# Patient Record
Sex: Male | Born: 1935 | Race: White | Hispanic: No | Marital: Married | Smoking: Current every day smoker
Health system: Southern US, Community
[De-identification: ages and names within clinical notes are randomized; demographics above are authoritative.]

## PROBLEM LIST (undated history)

## (undated) DIAGNOSIS — I10 Essential (primary) hypertension: Secondary | ICD-10-CM

## (undated) DIAGNOSIS — J939 Pneumothorax, unspecified: Secondary | ICD-10-CM

## (undated) DIAGNOSIS — C61 Malignant neoplasm of prostate: Secondary | ICD-10-CM

## (undated) DIAGNOSIS — I4891 Unspecified atrial fibrillation: Secondary | ICD-10-CM

## (undated) DIAGNOSIS — J449 Chronic obstructive pulmonary disease, unspecified: Secondary | ICD-10-CM

## (undated) DIAGNOSIS — J189 Pneumonia, unspecified organism: Secondary | ICD-10-CM

## (undated) DIAGNOSIS — Z720 Tobacco use: Secondary | ICD-10-CM

## (undated) DIAGNOSIS — I639 Cerebral infarction, unspecified: Secondary | ICD-10-CM

## (undated) DIAGNOSIS — Z950 Presence of cardiac pacemaker: Secondary | ICD-10-CM

## (undated) DIAGNOSIS — I2510 Atherosclerotic heart disease of native coronary artery without angina pectoris: Secondary | ICD-10-CM

## (undated) DIAGNOSIS — I509 Heart failure, unspecified: Secondary | ICD-10-CM

## (undated) DIAGNOSIS — I442 Atrioventricular block, complete: Secondary | ICD-10-CM

## (undated) HISTORY — DX: Tobacco use: Z72.0

## (undated) HISTORY — DX: Cerebral infarction, unspecified: I63.9

## (undated) HISTORY — DX: Malignant neoplasm of prostate: C61

## (undated) HISTORY — PX: EYE SURGERY: SHX253

## (undated) HISTORY — DX: Pneumothorax, unspecified: J93.9

## (undated) HISTORY — PX: APPENDECTOMY: SHX54

## (undated) HISTORY — DX: Essential (primary) hypertension: I10

## (undated) HISTORY — DX: Chronic obstructive pulmonary disease, unspecified: J44.9

## (undated) HISTORY — DX: Unspecified atrial fibrillation: I48.91

## (undated) HISTORY — PX: CHOLECYSTECTOMY: SHX55

## (undated) HISTORY — DX: Atrioventricular block, complete: I44.2

## (undated) HISTORY — DX: Atherosclerotic heart disease of native coronary artery without angina pectoris: I25.10

---

## 1998-05-19 ENCOUNTER — Ambulatory Visit (HOSPITAL_COMMUNITY): Admission: RE | Admit: 1998-05-19 | Discharge: 1998-05-19 | Payer: Self-pay | Admitting: Unknown Physician Specialty

## 1998-06-30 ENCOUNTER — Other Ambulatory Visit: Admission: RE | Admit: 1998-06-30 | Discharge: 1998-06-30 | Payer: Self-pay | Admitting: Urology

## 1998-08-26 DIAGNOSIS — I639 Cerebral infarction, unspecified: Secondary | ICD-10-CM

## 1998-08-26 HISTORY — DX: Cerebral infarction, unspecified: I63.9

## 1999-01-01 ENCOUNTER — Encounter: Payer: Self-pay | Admitting: Emergency Medicine

## 1999-01-01 ENCOUNTER — Emergency Department (HOSPITAL_COMMUNITY): Admission: EM | Admit: 1999-01-01 | Discharge: 1999-01-01 | Payer: Self-pay | Admitting: Emergency Medicine

## 2001-01-30 ENCOUNTER — Inpatient Hospital Stay (HOSPITAL_COMMUNITY): Admission: EM | Admit: 2001-01-30 | Discharge: 2001-02-03 | Payer: Self-pay | Admitting: Emergency Medicine

## 2001-01-30 ENCOUNTER — Encounter: Payer: Self-pay | Admitting: Neurology

## 2001-08-26 DIAGNOSIS — C61 Malignant neoplasm of prostate: Secondary | ICD-10-CM

## 2001-08-26 HISTORY — PX: PROSTATECTOMY: SHX69

## 2001-08-26 HISTORY — PX: CORONARY ARTERY BYPASS GRAFT: SHX141

## 2001-08-26 HISTORY — DX: Malignant neoplasm of prostate: C61

## 2002-01-11 ENCOUNTER — Encounter: Admission: RE | Admit: 2002-01-11 | Discharge: 2002-01-11 | Payer: Self-pay | Admitting: Urology

## 2002-01-11 ENCOUNTER — Encounter: Payer: Self-pay | Admitting: Urology

## 2002-02-08 ENCOUNTER — Encounter: Payer: Self-pay | Admitting: Urology

## 2002-02-08 ENCOUNTER — Ambulatory Visit (HOSPITAL_COMMUNITY): Admission: RE | Admit: 2002-02-08 | Discharge: 2002-02-08 | Payer: Self-pay | Admitting: Urology

## 2002-02-10 ENCOUNTER — Inpatient Hospital Stay (HOSPITAL_COMMUNITY): Admission: AD | Admit: 2002-02-10 | Discharge: 2002-02-25 | Payer: Self-pay | Admitting: Cardiovascular Disease

## 2002-02-15 ENCOUNTER — Encounter: Payer: Self-pay | Admitting: Thoracic Surgery (Cardiothoracic Vascular Surgery)

## 2002-02-16 ENCOUNTER — Encounter: Payer: Self-pay | Admitting: Thoracic Surgery (Cardiothoracic Vascular Surgery)

## 2002-02-17 ENCOUNTER — Encounter: Payer: Self-pay | Admitting: Thoracic Surgery (Cardiothoracic Vascular Surgery)

## 2002-02-18 ENCOUNTER — Encounter: Payer: Self-pay | Admitting: Thoracic Surgery (Cardiothoracic Vascular Surgery)

## 2002-02-20 ENCOUNTER — Encounter: Payer: Self-pay | Admitting: Cardiothoracic Surgery

## 2002-02-23 ENCOUNTER — Encounter: Payer: Self-pay | Admitting: Thoracic Surgery (Cardiothoracic Vascular Surgery)

## 2002-03-05 ENCOUNTER — Encounter: Payer: Self-pay | Admitting: Emergency Medicine

## 2002-03-05 ENCOUNTER — Emergency Department (HOSPITAL_COMMUNITY): Admission: EM | Admit: 2002-03-05 | Discharge: 2002-03-05 | Payer: Self-pay | Admitting: Emergency Medicine

## 2002-03-17 ENCOUNTER — Encounter
Admission: RE | Admit: 2002-03-17 | Discharge: 2002-03-17 | Payer: Self-pay | Admitting: Thoracic Surgery (Cardiothoracic Vascular Surgery)

## 2002-03-17 ENCOUNTER — Encounter: Payer: Self-pay | Admitting: Thoracic Surgery (Cardiothoracic Vascular Surgery)

## 2002-06-01 ENCOUNTER — Encounter: Payer: Self-pay | Admitting: Urology

## 2002-06-07 ENCOUNTER — Inpatient Hospital Stay (HOSPITAL_COMMUNITY): Admission: RE | Admit: 2002-06-07 | Discharge: 2002-06-09 | Payer: Self-pay | Admitting: Urology

## 2002-06-07 ENCOUNTER — Encounter (INDEPENDENT_AMBULATORY_CARE_PROVIDER_SITE_OTHER): Payer: Self-pay | Admitting: Specialist

## 2002-09-10 ENCOUNTER — Encounter (INDEPENDENT_AMBULATORY_CARE_PROVIDER_SITE_OTHER): Payer: Self-pay | Admitting: *Deleted

## 2002-09-10 ENCOUNTER — Observation Stay (HOSPITAL_COMMUNITY): Admission: RE | Admit: 2002-09-10 | Discharge: 2002-09-11 | Payer: Self-pay | Admitting: General Surgery

## 2002-11-25 ENCOUNTER — Emergency Department (HOSPITAL_COMMUNITY): Admission: EM | Admit: 2002-11-25 | Discharge: 2002-11-25 | Payer: Self-pay

## 2005-06-26 ENCOUNTER — Inpatient Hospital Stay (HOSPITAL_COMMUNITY): Admission: EM | Admit: 2005-06-26 | Discharge: 2005-07-01 | Payer: Self-pay | Admitting: Emergency Medicine

## 2005-07-16 ENCOUNTER — Encounter: Admission: RE | Admit: 2005-07-16 | Discharge: 2005-07-16 | Payer: Self-pay | Admitting: Surgery

## 2005-09-28 ENCOUNTER — Inpatient Hospital Stay (HOSPITAL_COMMUNITY): Admission: EM | Admit: 2005-09-28 | Discharge: 2005-09-30 | Payer: Self-pay | Admitting: Emergency Medicine

## 2007-05-02 ENCOUNTER — Emergency Department (HOSPITAL_COMMUNITY): Admission: EM | Admit: 2007-05-02 | Discharge: 2007-05-02 | Payer: Self-pay | Admitting: Emergency Medicine

## 2007-05-21 ENCOUNTER — Encounter (INDEPENDENT_AMBULATORY_CARE_PROVIDER_SITE_OTHER): Payer: Self-pay | Admitting: General Surgery

## 2007-05-21 ENCOUNTER — Ambulatory Visit (HOSPITAL_COMMUNITY): Admission: RE | Admit: 2007-05-21 | Discharge: 2007-05-21 | Payer: Self-pay | Admitting: Internal Medicine

## 2009-12-25 ENCOUNTER — Inpatient Hospital Stay (HOSPITAL_COMMUNITY): Admission: AD | Admit: 2009-12-25 | Discharge: 2009-12-29 | Payer: Self-pay | Admitting: Cardiovascular Disease

## 2009-12-26 ENCOUNTER — Encounter (INDEPENDENT_AMBULATORY_CARE_PROVIDER_SITE_OTHER): Payer: Self-pay | Admitting: Cardiovascular Disease

## 2010-04-19 ENCOUNTER — Encounter: Admission: RE | Admit: 2010-04-19 | Discharge: 2010-04-19 | Payer: Self-pay | Admitting: Cardiovascular Disease

## 2010-07-10 ENCOUNTER — Ambulatory Visit (HOSPITAL_COMMUNITY): Admission: RE | Admit: 2010-07-10 | Discharge: 2010-07-11 | Payer: Self-pay | Admitting: Obstetrics and Gynecology

## 2010-10-22 ENCOUNTER — Other Ambulatory Visit: Payer: Self-pay | Admitting: Cardiovascular Disease

## 2010-10-22 ENCOUNTER — Ambulatory Visit
Admission: RE | Admit: 2010-10-22 | Discharge: 2010-10-22 | Disposition: A | Discharge status: Home / Self Care | Payer: Medicare Other | Source: Ambulatory Visit | Attending: Cardiovascular Disease | Admitting: Cardiovascular Disease

## 2010-10-22 ENCOUNTER — Ambulatory Visit (HOSPITAL_COMMUNITY): Payer: Self-pay

## 2010-10-22 DIAGNOSIS — R6883 Chills (without fever): Secondary | ICD-10-CM

## 2010-10-22 DIAGNOSIS — J449 Chronic obstructive pulmonary disease, unspecified: Secondary | ICD-10-CM

## 2010-10-29 ENCOUNTER — Emergency Department (HOSPITAL_COMMUNITY): Payer: Medicare Other

## 2010-10-29 ENCOUNTER — Inpatient Hospital Stay (HOSPITAL_COMMUNITY)
Admission: EM | Admit: 2010-10-29 | Discharge: 2010-11-07 | DRG: 242 | Disposition: A | Discharge status: Home / Self Care | Payer: Medicare Other | Attending: Cardiovascular Disease | Admitting: Cardiovascular Disease

## 2010-10-29 DIAGNOSIS — I5023 Acute on chronic systolic (congestive) heart failure: Secondary | ICD-10-CM | POA: Diagnosis present

## 2010-10-29 DIAGNOSIS — I4891 Unspecified atrial fibrillation: Secondary | ICD-10-CM | POA: Diagnosis present

## 2010-10-29 DIAGNOSIS — J4489 Other specified chronic obstructive pulmonary disease: Secondary | ICD-10-CM | POA: Diagnosis present

## 2010-10-29 DIAGNOSIS — I442 Atrioventricular block, complete: Principal | ICD-10-CM | POA: Diagnosis present

## 2010-10-29 DIAGNOSIS — Z7901 Long term (current) use of anticoagulants: Secondary | ICD-10-CM

## 2010-10-29 DIAGNOSIS — I509 Heart failure, unspecified: Secondary | ICD-10-CM | POA: Diagnosis present

## 2010-10-29 DIAGNOSIS — I498 Other specified cardiac arrhythmias: Secondary | ICD-10-CM | POA: Diagnosis not present

## 2010-10-29 DIAGNOSIS — J449 Chronic obstructive pulmonary disease, unspecified: Secondary | ICD-10-CM | POA: Diagnosis present

## 2010-10-29 LAB — CBC
HCT: 44.1 % (ref 39.0–52.0)
Hemoglobin: 13.8 g/dL (ref 13.0–17.0)
MCH: 28.8 pg (ref 26.0–34.0)
MCHC: 31.3 g/dL (ref 30.0–36.0)
MCV: 92.1 fL (ref 78.0–100.0)
Platelets: 228 10*3/uL (ref 150–400)
RBC: 4.79 MIL/uL (ref 4.22–5.81)
RDW: 14.3 % (ref 11.5–15.5)
WBC: 8.8 10*3/uL (ref 4.0–10.5)

## 2010-10-29 LAB — DIFFERENTIAL
Basophils Absolute: 0 10*3/uL (ref 0.0–0.1)
Basophils Relative: 0 % (ref 0–1)
Eosinophils Absolute: 0.1 10*3/uL (ref 0.0–0.7)
Eosinophils Relative: 1 % (ref 0–5)
Lymphocytes Relative: 23 % (ref 12–46)
Lymphs Abs: 2 10*3/uL (ref 0.7–4.0)
Monocytes Absolute: 1.1 10*3/uL — ABNORMAL HIGH (ref 0.1–1.0)
Monocytes Relative: 13 % — ABNORMAL HIGH (ref 3–12)
Neutro Abs: 5.6 10*3/uL (ref 1.7–7.7)
Neutrophils Relative %: 64 % (ref 43–77)

## 2010-10-29 LAB — COMPREHENSIVE METABOLIC PANEL
ALT: 33 U/L (ref 0–53)
AST: 42 U/L — ABNORMAL HIGH (ref 0–37)
Albumin: 3.6 g/dL (ref 3.5–5.2)
Alkaline Phosphatase: 98 U/L (ref 39–117)
BUN: 22 mg/dL (ref 6–23)
CO2: 28 mEq/L (ref 19–32)
Calcium: 9.5 mg/dL (ref 8.4–10.5)
Chloride: 104 mEq/L (ref 96–112)
Creatinine, Ser: 0.96 mg/dL (ref 0.4–1.5)
GFR calc Af Amer: >60 mL/min (ref >60)
GFR calc non Af Amer: >60 mL/min (ref >60)
Glucose, Bld: 97 mg/dL (ref 70–99)
Potassium: 4.2 mEq/L (ref 3.5–5.1)
Sodium: 140 mEq/L (ref 135–145)
Total Bilirubin: 1.4 mg/dL — ABNORMAL HIGH (ref 0.3–1.2)
Total Protein: 6.8 g/dL (ref 6.0–8.3)

## 2010-10-29 LAB — CARDIAC PANEL(CRET KIN+CKTOT+MB+TROPI)
CK, MB: 6.9 ng/mL (ref 0.3–4.0)
CK, MB: 6.8 ng/mL (ref 0.3–4.0)
Relative Index: INVALID (ref 0.0–2.5)
Relative Index: INVALID (ref 0.0–2.5)
Total CK: 67 U/L (ref 7–232)
Total CK: 68 U/L (ref 7–232)
Troponin I: 0.1 ng/mL — ABNORMAL HIGH (ref 0.00–0.06)
Troponin I: 0.1 ng/mL — ABNORMAL HIGH (ref 0.00–0.06)

## 2010-10-29 LAB — PROTIME-INR
INR: 1.64 — ABNORMAL HIGH (ref 0.00–1.49)
Prothrombin Time: 19.6 seconds — ABNORMAL HIGH (ref 11.6–15.2)

## 2010-10-29 LAB — BRAIN NATRIURETIC PEPTIDE: Pro B Natriuretic peptide (BNP): 773 pg/mL — ABNORMAL HIGH (ref 0.0–100.0)

## 2010-10-30 LAB — HEPARIN LEVEL (UNFRACTIONATED): Heparin Unfractionated: 0.19 IU/mL — ABNORMAL LOW (ref 0.30–0.70)

## 2010-10-30 LAB — LIPID PANEL: Cholesterol: 112 mg/dL (ref 0–200)

## 2010-10-30 LAB — PROTIME-INR
INR: 1.66 — ABNORMAL HIGH (ref 0.00–1.49)
Prothrombin Time: 19.8 seconds — ABNORMAL HIGH (ref 11.6–15.2)

## 2010-10-30 LAB — CARDIAC PANEL(CRET KIN+CKTOT+MB+TROPI)
Relative Index: INVALID (ref 0.0–2.5)
Total CK: 60 U/L (ref 7–232)

## 2010-10-31 ENCOUNTER — Observation Stay (HOSPITAL_COMMUNITY): Payer: Medicare Other

## 2010-10-31 LAB — CBC
HCT: 42.2 % (ref 39.0–52.0)
Hemoglobin: 13 g/dL (ref 13.0–17.0)
MCHC: 30.8 g/dL (ref 30.0–36.0)
Platelets: 188 10*3/uL (ref 150–400)
RBC: 4.53 MIL/uL (ref 4.22–5.81)
RDW: 14.1 % (ref 11.5–15.5)
WBC: 8.1 10*3/uL (ref 4.0–10.5)
WBC: 12.1 10*3/uL — ABNORMAL HIGH (ref 4.0–10.5)

## 2010-10-31 LAB — PROTIME-INR: INR: 1.78 — ABNORMAL HIGH (ref 0.00–1.49)

## 2010-10-31 LAB — DIFFERENTIAL
Basophils Absolute: 0 10*3/uL (ref 0.0–0.1)
Lymphocytes Relative: 12 % (ref 12–46)
Monocytes Absolute: 1.5 10*3/uL — ABNORMAL HIGH (ref 0.1–1.0)
Neutro Abs: 9.1 10*3/uL — ABNORMAL HIGH (ref 1.7–7.7)
Neutrophils Relative %: 75 % (ref 43–77)

## 2010-10-31 LAB — CARDIAC PANEL(CRET KIN+CKTOT+MB+TROPI)
CK, MB: 5.5 ng/mL — ABNORMAL HIGH (ref 0.3–4.0)
Troponin I: 0.06 ng/mL (ref 0.00–0.06)

## 2010-10-31 LAB — BASIC METABOLIC PANEL
BUN: 25 mg/dL — ABNORMAL HIGH (ref 6–23)
GFR calc non Af Amer: >60 mL/min (ref >60)
Potassium: 4.5 mEq/L (ref 3.5–5.1)
Sodium: 137 mEq/L (ref 135–145)

## 2010-11-01 LAB — CBC
HCT: 39.2 % (ref 39.0–52.0)
Hemoglobin: 11.8 g/dL — ABNORMAL LOW (ref 13.0–17.0)
MCH: 27.8 pg (ref 26.0–34.0)
MCV: 92.2 fL (ref 78.0–100.0)
RBC: 4.25 MIL/uL (ref 4.22–5.81)

## 2010-11-01 LAB — BASIC METABOLIC PANEL
CO2: 32 mEq/L (ref 19–32)
Chloride: 99 mEq/L (ref 96–112)
GFR calc Af Amer: >60 mL/min (ref >60)
Potassium: 4.7 mEq/L (ref 3.5–5.1)
Sodium: 139 mEq/L (ref 135–145)

## 2010-11-01 LAB — HEPARIN LEVEL (UNFRACTIONATED): Heparin Unfractionated: 0.27 IU/mL — ABNORMAL LOW (ref 0.30–0.70)

## 2010-11-01 LAB — MAGNESIUM: Magnesium: 2 mg/dL (ref 1.5–2.5)

## 2010-11-01 LAB — PROTIME-INR: INR: 1.41 (ref 0.00–1.49)

## 2010-11-02 LAB — PROTIME-INR
INR: 1.43 (ref 0.00–1.49)
Prothrombin Time: 17.6 seconds — ABNORMAL HIGH (ref 11.6–15.2)

## 2010-11-02 LAB — HEPARIN LEVEL (UNFRACTIONATED): Heparin Unfractionated: 0.44 IU/mL (ref 0.30–0.70)

## 2010-11-02 LAB — BASIC METABOLIC PANEL
CO2: 30 mEq/L (ref 19–32)
Calcium: 8.5 mg/dL (ref 8.4–10.5)
Chloride: 100 mEq/L (ref 96–112)
Creatinine, Ser: 1.28 mg/dL (ref 0.4–1.5)
GFR calc Af Amer: >60 mL/min (ref >60)
Sodium: 135 mEq/L (ref 135–145)

## 2010-11-02 LAB — CBC
HCT: 35.7 % — ABNORMAL LOW (ref 39.0–52.0)
Hemoglobin: 10.9 g/dL — ABNORMAL LOW (ref 13.0–17.0)
WBC: 8.2 10*3/uL (ref 4.0–10.5)

## 2010-11-03 LAB — PROTIME-INR: INR: 1.8 — ABNORMAL HIGH (ref 0.00–1.49)

## 2010-11-03 LAB — HEPARIN LEVEL (UNFRACTIONATED): Heparin Unfractionated: 0.73 IU/mL — ABNORMAL HIGH (ref 0.30–0.70)

## 2010-11-04 LAB — CBC
HCT: 32.7 % — ABNORMAL LOW (ref 39.0–52.0)
Hemoglobin: 10.1 g/dL — ABNORMAL LOW (ref 13.0–17.0)
RBC: 3.51 MIL/uL — ABNORMAL LOW (ref 4.22–5.81)

## 2010-11-04 LAB — HEPARIN LEVEL (UNFRACTIONATED): Heparin Unfractionated: 0.44 IU/mL (ref 0.30–0.70)

## 2010-11-04 LAB — BASIC METABOLIC PANEL
Calcium: 8.8 mg/dL (ref 8.4–10.5)
GFR calc Af Amer: >60 mL/min (ref >60)
GFR calc non Af Amer: >60 mL/min (ref >60)
Potassium: 4.8 mEq/L (ref 3.5–5.1)
Sodium: 138 mEq/L (ref 135–145)

## 2010-11-04 LAB — PROTIME-INR
INR: 2.23 — ABNORMAL HIGH (ref 0.00–1.49)
Prothrombin Time: 24.8 seconds — ABNORMAL HIGH (ref 11.6–15.2)

## 2010-11-05 DIAGNOSIS — I498 Other specified cardiac arrhythmias: Secondary | ICD-10-CM

## 2010-11-05 LAB — PROTIME-INR
INR: 2.13 — ABNORMAL HIGH (ref 0.00–1.49)
Prothrombin Time: 24 seconds — ABNORMAL HIGH (ref 11.6–15.2)

## 2010-11-05 LAB — CBC
HCT: 32.1 % — ABNORMAL LOW (ref 39.0–52.0)
Hemoglobin: 9.8 g/dL — ABNORMAL LOW (ref 13.0–17.0)
MCHC: 30.5 g/dL (ref 30.0–36.0)
RBC: 3.45 MIL/uL — ABNORMAL LOW (ref 4.22–5.81)
WBC: 6.2 10*3/uL (ref 4.0–10.5)

## 2010-11-06 DIAGNOSIS — I442 Atrioventricular block, complete: Secondary | ICD-10-CM

## 2010-11-06 HISTORY — PX: PACEMAKER INSERTION: SHX728

## 2010-11-06 LAB — CBC
HCT: 43.5 % (ref 39.0–52.0)
Hemoglobin: 10.5 g/dL — ABNORMAL LOW (ref 13.0–17.0)
MCHC: 32 g/dL (ref 30.0–36.0)
Platelets: 226 10*3/uL (ref 150–400)
RBC: 3.57 MIL/uL — ABNORMAL LOW (ref 4.22–5.81)
RBC: 4.86 MIL/uL (ref 4.22–5.81)
RDW: 14 % (ref 11.5–15.5)
WBC: 6.6 10*3/uL (ref 4.0–10.5)
WBC: 8.6 10*3/uL (ref 4.0–10.5)

## 2010-11-06 LAB — BASIC METABOLIC PANEL
BUN: 19 mg/dL (ref 6–23)
GFR calc Af Amer: >60 mL/min (ref >60)
GFR calc non Af Amer: >60 mL/min (ref >60)
Potassium: 4.9 mEq/L (ref 3.5–5.1)

## 2010-11-06 LAB — APTT: aPTT: 31 seconds (ref 24–37)

## 2010-11-06 LAB — PROTIME-INR
INR: 1.93 — ABNORMAL HIGH (ref 0.00–1.49)
INR: 1.2 (ref 0.00–1.49)
Prothrombin Time: 22.2 seconds — ABNORMAL HIGH (ref 11.6–15.2)

## 2010-11-07 ENCOUNTER — Encounter: Payer: Self-pay | Admitting: Internal Medicine

## 2010-11-07 ENCOUNTER — Inpatient Hospital Stay (HOSPITAL_COMMUNITY): Payer: Medicare Other

## 2010-11-07 LAB — CBC
MCH: 28.8 pg (ref 26.0–34.0)
MCV: 91.2 fL (ref 78.0–100.0)
Platelets: 198 10*3/uL (ref 150–400)
RDW: 14 % (ref 11.5–15.5)

## 2010-11-07 LAB — CULTURE, BLOOD (ROUTINE X 2)
Culture  Setup Time: 201203080102
Culture  Setup Time: 201203080102
Culture: NO GROWTH

## 2010-11-09 NOTE — Op Note (Signed)
  [name], [name]                 ACCOUNT NO.:  [id number]  MEDICAL RECORD NO.:  [id number]           PATIENT TYPE:  I  LOCATION:  3714                         FACILITY:  [facility]  PHYSICIAN:  Hillis Range, MD       DATE OF BIRTH:  [birth year]  DATE OF PROCEDURE:  11/06/2010 DATE OF DISCHARGE:                              OPERATIVE REPORT   PREPROCEDURE DIAGNOSES: 1. Complete heart block. 2. Permanent atrial fibrillation.  POSTPROCEDURE DIAGNOSES: 1. Complete heart block. 2. Permanent atrial fibrillation.  PROCEDURES: 1. Left upper extremity venography. 2. Pacemaker implantation.  INTRODUCTION:  [name] is a pleasant 77 year old gentleman with a history of chronic lung disease and permanent atrial fibrillation.  He was admitted with shortness of breath and fatigue.  He reports frequent episodes of dizziness.  He was found to have atrial fibrillation with prolonged and regular RR intervals.  He, therefore, presents for pacemaker implantation.  DESCRIPTION OF PROCEDURE:  Informed written consent was obtained and the patient was brought to the electrophysiology lab in the fasting state. He received no sedation for the procedure today due to chronic lung disease.  His left chest was prepped and draped in the usual sterile fashion by the EP lab staff.  The skin overlying his left chest was infiltrated with lidocaine for local analgesia.  A 3-cm incision was made over the left deltopectoral region.  A left subcutaneous pacemaker pocket was fashioned using a combination of sharp and blunt dissection. Electrocautery was used to assure hemostasis.  A venogram of the left upper extremity was performed which revealed a very small left cephalic vein with a moderate-sized left axillary vein which both emptied into a moderate-sized left subclavian vein.  The left axillary vein was, therefore, cannulated with fluoroscopic visualization.  Through the left axillary vein, a [employer]  Isoflex model [id number] (serial number [id number]).  Right ventricular lead was advanced with fluoroscopic visualization into the right ventricular apex position.  R-waves measured 11.1 mV with impedance of 548 ohms and a threshold of 0.4 volts at 0.4 milliseconds.  Due to permanent atrial fibrillation, an atrial lead was not implanted.  The lead was, therefore, secured to the pectoralis fascia using #2 silk suture over the suture sleeves.  The lead was then connected to a Designer[employer] jewellery accent RFSR model [id number]) pacemaker.  The pocket was irrigated with copious gentamicin solution.  The pacemaker was then placed into the pocket.  The pocket was then closed in 2 layers with 2.0 Vicryl suture for the subcutaneous and subcuticular layers.  Steri-Strips and a sterile dressing were then applied.  There were no early apparent complications.  CONCLUSIONS: 1. Successful implantation of a [employer]. Jude Medical accent RFSR pacemaker     for permanent atrial fibrillation with complete heart block. 2. No early apparent complications.     Hillis Range, MD     JA/MEDQ  D:  11/06/2010  T:  11/07/2010  Job:  [id number]  cc:   Ricki Rodriguez, M.D.  Electronically Signed by Hillis Range MD on 11/09/2010 10:57:59 PM

## 2010-11-13 LAB — LIPID PANEL
LDL Cholesterol: 62 mg/dL (ref 0–99)
VLDL: 10 mg/dL (ref 0–40)

## 2010-11-13 LAB — BASIC METABOLIC PANEL
BUN: 14 mg/dL (ref 6–23)
BUN: 15 mg/dL (ref 6–23)
BUN: 17 mg/dL (ref 6–23)
CO2: 33 mEq/L — ABNORMAL HIGH (ref 19–32)
CO2: 35 mEq/L — ABNORMAL HIGH (ref 19–32)
Calcium: 8.8 mg/dL (ref 8.4–10.5)
Chloride: 101 mEq/L (ref 96–112)
Chloride: 96 mEq/L (ref 96–112)
Chloride: 98 mEq/L (ref 96–112)
Creatinine, Ser: 0.84 mg/dL (ref 0.4–1.5)
GFR calc Af Amer: >60 mL/min (ref >60)
GFR calc non Af Amer: >60 mL/min (ref >60)
Glucose, Bld: 109 mg/dL — ABNORMAL HIGH (ref 70–99)
Glucose, Bld: 120 mg/dL — ABNORMAL HIGH (ref 70–99)
Potassium: 4 mEq/L (ref 3.5–5.1)
Potassium: 4.8 mEq/L (ref 3.5–5.1)
Sodium: 135 mEq/L (ref 135–145)
Sodium: 135 mEq/L (ref 135–145)

## 2010-11-13 LAB — CARDIAC PANEL(CRET KIN+CKTOT+MB+TROPI)
CK, MB: 5.5 ng/mL — ABNORMAL HIGH (ref 0.3–4.0)
Relative Index: INVALID (ref 0.0–2.5)
Total CK: 50 U/L (ref 7–232)
Troponin I: 0.1 ng/mL — ABNORMAL HIGH (ref 0.00–0.06)

## 2010-11-13 LAB — CBC
HCT: 37.3 % — ABNORMAL LOW (ref 39.0–52.0)
HCT: 39.4 % (ref 39.0–52.0)
HCT: 41.1 % (ref 39.0–52.0)
Hemoglobin: 12.1 g/dL — ABNORMAL LOW (ref 13.0–17.0)
Hemoglobin: 13.2 g/dL (ref 13.0–17.0)
MCHC: 32.3 g/dL (ref 30.0–36.0)
MCHC: 33.4 g/dL (ref 30.0–36.0)
MCV: 87.7 fL (ref 78.0–100.0)
MCV: 87.8 fL (ref 78.0–100.0)
MCV: 88.2 fL (ref 78.0–100.0)
Platelets: 199 10*3/uL (ref 150–400)
RBC: 4.49 MIL/uL (ref 4.22–5.81)
RBC: 4.68 MIL/uL (ref 4.22–5.81)
RDW: 15 % (ref 11.5–15.5)
WBC: 8.6 10*3/uL (ref 4.0–10.5)
WBC: 8.9 10*3/uL (ref 4.0–10.5)

## 2010-11-13 LAB — PROTIME-INR
Prothrombin Time: 20.6 seconds — ABNORMAL HIGH (ref 11.6–15.2)
Prothrombin Time: 35.6 seconds — ABNORMAL HIGH (ref 11.6–15.2)

## 2010-11-13 LAB — COMPREHENSIVE METABOLIC PANEL
AST: 35 U/L (ref 0–37)
BUN: 17 mg/dL (ref 6–23)
CO2: 31 mEq/L (ref 19–32)
Chloride: 105 mEq/L (ref 96–112)
Creatinine, Ser: 0.85 mg/dL (ref 0.4–1.5)
GFR calc non Af Amer: >60 mL/min (ref >60)
Total Bilirubin: 0.7 mg/dL (ref 0.3–1.2)

## 2010-11-13 LAB — DIFFERENTIAL
Basophils Absolute: 0 10*3/uL (ref 0.0–0.1)
Eosinophils Relative: 1 % (ref 0–5)
Lymphocytes Relative: 21 % (ref 12–46)
Neutro Abs: 5.8 10*3/uL (ref 1.7–7.7)

## 2010-11-13 NOTE — Miscellaneous (Signed)
Summary: Device preload  Clinical Lists Changes  Observations: Added new observation of PPM INDICATN: CHB (11/07/2010 15:32) Added new observation of MAGNET RTE: BOL 100 ERI  85 (11/07/2010 15:32) Added new observation of PPMLEADSTAT1: active (11/07/2010 15:32) Added new observation of PPMLEADSER1: [id number] (11/07/2010 15:32) Added new observation of PPMLEADMOD1: 1948  (11/07/2010 15:32) Added new observation of PPMLEADLOC1: RV  (11/07/2010 15:32) Added new observation of PPM IMP MD: Hillis Range, MD  (11/07/2010 15:32) Added new observation of PPMLEADDOI1: 11/06/2010  (11/07/2010 15:32) Added new observation of PPM DOI: 11/06/2010  (11/07/2010 15:32) Added new observation of PPM SERL#: [id number]  (11/07/2010 15:32) Added new observation of PPM MODL#: [id number]  (11/07/2010 56:21) Added new observation of PACEMAKERMFG: St Jude  (11/07/2010 15:32) Added new observation of PACEMAKER MD: Hillis Range, MD  (11/07/2010 15:32)      PPM Specifications Following MD:  Hillis Range, MD     PPM Vendor:  St Jude     PPM Model Number:  [id number]     PPM Serial Number:  [id number] PPM DOI:  11/06/2010     PPM Implanting MD:  Hillis Range, MD  Lead 1    Location: RV     DOI: 11/06/2010     Model #: 1946     Serial #: [id number]     Status: active  Magnet Response Rate:  BOL 100 ERI  85  Indications:  CHB

## 2010-11-14 NOTE — Discharge Summary (Signed)
[name], [name]                 ACCOUNT NO.:  [id number]  MEDICAL RECORD NO.:  [id number]           PATIENT TYPE:  I  LOCATION:  3714                         FACILITY:  [facility]  PHYSICIAN:  Ricki Rodriguez, M.D.  DATE OF BIRTH:  03-08-1931  DATE OF ADMISSION:  10/29/2010 DATE OF DISCHARGE:  11/07/2010                              DISCHARGE SUMMARY   FINAL DIAGNOSES: 1. Acute on chronic biventricular heart failure. 2. Atrial fibrillation with complete heart block. 3. Severe chronic obstructive lung disease. 4. Tobacco use disorder.  DISCHARGE MEDICATIONS: 1. Lasix 40 mg one daily. 2. Nitroglycerin 0.4 mg tablet one under the tongue every 5 minutes as     needed up to three doses as needed. 3. Toprol-XL 25 mg one daily. 4. Xopenex 2 inhalations four times daily. 5. Coumadin 7.5 mg half a tablet daily. 6. Lisinopril 5 mg half a tablet daily. 7. Calcium with vitamin D over-the-counter 2 tablets daily. 8. Lipitor 20 mg one daily. 9. Multivitamin one daily. 10.The patient to discontinue diltiazem.  DISCHARGE ACTIVITY:  The patient to increase activity slowly as tolerated.  Follow up by Dr. Orpah Cobb in 1 week and by Dr. Johney Frame in 2-4 weeks.  CONDITION ON DISCHARGE:  Improved.  HISTORY:  This 77 year old white male presented with a 1-day history of shortness of breath.  The patient has known history of chronic obstructive lung disease and has been off oxygen use for 4-5 months. He  has 100 pk yr h/o of smoking.  PHYSICAL EXAMINATION:  VITAL SIGNS:  Temperature 98, pulse 100, respirations 20, blood pressure 116/84. GENERAL:  The patient is well-built, averagely nourished, in mild respiratory distress. HEENT:  The patient is normocephalic, atraumatic, wears glasses, has blue eyes. HEENT:  Conjunctivae pink.  Sclerae nonicteric.  Wears upper and lower dentures. NECK:  No JVD.  No carotid bruit. LUNGS:  Decreased air entry with few basilar crackles.  Midline  surgical scar. HEART:  Normal S1 and S2 with grade 2/6 systolic murmur. ABDOMEN:  Soft and nontender. EXTREMITIES:  No edema, cyanosis, clubbing. SKIN:  Warm and dry. NEUROLOGIC:  The patient moves all four extremities and he is right- handed.  LABORATORY DATA:  Normal hemoglobin, hematocrit, WBC count, platelet count.  INR on admission was 1.64, subsequently it was therapeutic, and on the day of discharge it was down to 1.85.  Electrolytes normal.  BUN and creatinine normal.  Bilirubin slightly high at 1.4.  SGOT slightly high at 42.  B-natriuretic peptide elevated at 773.  Subsequent B- natriuretic peptide was down to 273.  CK-MB was slightly high at 6.7 with a troponin-I of 0.08 on October 30, 2010.  Magnesium level was 2.0.  EKG was atrial fibrillation with left bundle-branch block.  Subsequent EKG was V-paced at the rate of 60.    Chest x-ray showed small right pleural effusion with chronic cardiomegaly  Chest x-ray on day of discharge showed significant decrease in congestive  heart failure.  2-D echocardiogram showed severe LV systolic dysfunction with EF of 20 %, mild AS and Moderate MR.  HOSPITAL COURSE:  The patient was admitted  to telemetry unit with his heart failure.  He received IV Lasix.  His condition gradually improved, but he developed atrial fibrillation with complete heart block.  EP consult was obtained.  He received VVI pacemaker.  His heart failure condition had improved with a heart rate of 60 per minute. His Coumadin  dose was decreased to half a tablet of 7.5 mg daily. He was given Xopenex  inhalations as needed, oxygen 2 L per minute by nasal cannula, and a small  dose of Toprol-XL 25 mg was added for his heart failure along with a small  dose of lisinopril and Lipitor. He was discharged home in satisfactory condition.     Ricki Rodriguez, M.D.     ASK/MEDQ  D:  11/07/2010  T:  11/08/2010  Job:  [id number]  Electronically Signed by Orpah Cobb M.D.  on 11/14/2010 09:17:23 AM

## 2010-11-15 ENCOUNTER — Ambulatory Visit (INDEPENDENT_AMBULATORY_CARE_PROVIDER_SITE_OTHER): Payer: Medicare Other | Admitting: *Deleted

## 2010-11-15 DIAGNOSIS — I442 Atrioventricular block, complete: Secondary | ICD-10-CM

## 2011-01-06 ENCOUNTER — Emergency Department (HOSPITAL_COMMUNITY): Payer: Medicare Other

## 2011-01-06 ENCOUNTER — Inpatient Hospital Stay (HOSPITAL_COMMUNITY)
Admission: EM | Admit: 2011-01-06 | Discharge: 2011-01-17 | DRG: 200 | Disposition: A | Discharge status: Home / Self Care | Payer: Medicare Other | Attending: Emergency Medicine | Admitting: Emergency Medicine

## 2011-01-06 DIAGNOSIS — I10 Essential (primary) hypertension: Secondary | ICD-10-CM | POA: Diagnosis present

## 2011-01-06 DIAGNOSIS — J9383 Other pneumothorax: Principal | ICD-10-CM | POA: Diagnosis present

## 2011-01-06 DIAGNOSIS — Z8673 Personal history of transient ischemic attack (TIA), and cerebral infarction without residual deficits: Secondary | ICD-10-CM

## 2011-01-06 DIAGNOSIS — I5042 Chronic combined systolic (congestive) and diastolic (congestive) heart failure: Secondary | ICD-10-CM | POA: Diagnosis present

## 2011-01-06 DIAGNOSIS — J9312 Secondary spontaneous pneumothorax: Secondary | ICD-10-CM

## 2011-01-06 DIAGNOSIS — R0902 Hypoxemia: Secondary | ICD-10-CM

## 2011-01-06 DIAGNOSIS — I252 Old myocardial infarction: Secondary | ICD-10-CM

## 2011-01-06 DIAGNOSIS — I472 Ventricular tachycardia, unspecified: Secondary | ICD-10-CM | POA: Diagnosis not present

## 2011-01-06 DIAGNOSIS — Z8546 Personal history of malignant neoplasm of prostate: Secondary | ICD-10-CM

## 2011-01-06 DIAGNOSIS — I509 Heart failure, unspecified: Secondary | ICD-10-CM | POA: Diagnosis present

## 2011-01-06 DIAGNOSIS — J449 Chronic obstructive pulmonary disease, unspecified: Secondary | ICD-10-CM

## 2011-01-06 DIAGNOSIS — I4891 Unspecified atrial fibrillation: Secondary | ICD-10-CM

## 2011-01-06 DIAGNOSIS — Z66 Do not resuscitate: Secondary | ICD-10-CM | POA: Diagnosis present

## 2011-01-06 DIAGNOSIS — Z951 Presence of aortocoronary bypass graft: Secondary | ICD-10-CM

## 2011-01-06 DIAGNOSIS — Z79899 Other long term (current) drug therapy: Secondary | ICD-10-CM

## 2011-01-06 DIAGNOSIS — I2510 Atherosclerotic heart disease of native coronary artery without angina pectoris: Secondary | ICD-10-CM | POA: Diagnosis present

## 2011-01-06 DIAGNOSIS — I4729 Other ventricular tachycardia: Secondary | ICD-10-CM | POA: Diagnosis not present

## 2011-01-06 DIAGNOSIS — Z7901 Long term (current) use of anticoagulants: Secondary | ICD-10-CM

## 2011-01-06 DIAGNOSIS — J9382 Other air leak: Secondary | ICD-10-CM | POA: Diagnosis not present

## 2011-01-06 DIAGNOSIS — F17200 Nicotine dependence, unspecified, uncomplicated: Secondary | ICD-10-CM | POA: Diagnosis present

## 2011-01-06 DIAGNOSIS — J439 Emphysema, unspecified: Secondary | ICD-10-CM | POA: Diagnosis present

## 2011-01-06 LAB — DIFFERENTIAL
Basophils Absolute: 0 10*3/uL (ref 0.0–0.1)
Lymphocytes Relative: 28 % (ref 12–46)
Lymphs Abs: 2.8 10*3/uL (ref 0.7–4.0)
Monocytes Absolute: 1.1 10*3/uL — ABNORMAL HIGH (ref 0.1–1.0)
Neutro Abs: 5.7 10*3/uL (ref 1.7–7.7)

## 2011-01-06 LAB — PHOSPHORUS: Phosphorus: 4.2 mg/dL (ref 2.3–4.6)

## 2011-01-06 LAB — CBC
HCT: 44.9 % (ref 39.0–52.0)
Hemoglobin: 14.8 g/dL (ref 13.0–17.0)
MCHC: 33 g/dL (ref 30.0–36.0)
MCV: 89.4 fL (ref 78.0–100.0)

## 2011-01-06 LAB — POCT CARDIAC MARKERS
Myoglobin, poc: 98.3 ng/mL (ref 12–200)
Troponin i, poc: <0.05 ng/mL (ref 0.00–0.09)

## 2011-01-06 LAB — MRSA PCR SCREENING: MRSA by PCR: NEGATIVE

## 2011-01-06 LAB — LACTIC ACID, PLASMA: Lactic Acid, Venous: 1.1 mmol/L (ref 0.5–2.2)

## 2011-01-06 LAB — PROTIME-INR: INR: 2.2 — ABNORMAL HIGH (ref 0.00–1.49)

## 2011-01-06 LAB — BASIC METABOLIC PANEL
BUN: 25 mg/dL — ABNORMAL HIGH (ref 6–23)
CO2: 30 mEq/L (ref 19–32)
Chloride: 96 mEq/L (ref 96–112)
GFR calc Af Amer: >60 mL/min (ref >60)
Potassium: 4.5 mEq/L (ref 3.5–5.1)

## 2011-01-06 LAB — TYPE AND SCREEN: ABO/RH(D): O POS

## 2011-01-06 LAB — MAGNESIUM: Magnesium: 2.1 mg/dL (ref 1.5–2.5)

## 2011-01-07 ENCOUNTER — Inpatient Hospital Stay (HOSPITAL_COMMUNITY): Payer: Medicare Other

## 2011-01-07 LAB — CBC
HCT: 39.7 % (ref 39.0–52.0)
MCHC: 32.5 g/dL (ref 30.0–36.0)
MCV: 88 fL (ref 78.0–100.0)
Platelets: 236 10*3/uL (ref 150–400)
RDW: 13.9 % (ref 11.5–15.5)

## 2011-01-07 LAB — PROTIME-INR: Prothrombin Time: 21.2 seconds — ABNORMAL HIGH (ref 11.6–15.2)

## 2011-01-07 LAB — PREPARE FRESH FROZEN PLASMA

## 2011-01-07 LAB — BASIC METABOLIC PANEL
BUN: 28 mg/dL — ABNORMAL HIGH (ref 6–23)
CO2: 32 mEq/L (ref 19–32)
Glucose, Bld: 132 mg/dL — ABNORMAL HIGH (ref 70–99)
Potassium: 3.9 mEq/L (ref 3.5–5.1)
Sodium: 139 mEq/L (ref 135–145)

## 2011-01-07 LAB — HEPARIN LEVEL (UNFRACTIONATED): Heparin Unfractionated: 0.36 IU/mL (ref 0.30–0.70)

## 2011-01-08 ENCOUNTER — Inpatient Hospital Stay (HOSPITAL_COMMUNITY): Payer: Medicare Other

## 2011-01-08 DIAGNOSIS — J930 Spontaneous tension pneumothorax: Secondary | ICD-10-CM

## 2011-01-08 LAB — CBC
HCT: 38.8 % — ABNORMAL LOW (ref 39.0–52.0)
Hemoglobin: 12.6 g/dL — ABNORMAL LOW (ref 13.0–17.0)
MCV: 88 fL (ref 78.0–100.0)
WBC: 16 10*3/uL — ABNORMAL HIGH (ref 4.0–10.5)

## 2011-01-08 LAB — PROTIME-INR: INR: 1.8 — ABNORMAL HIGH (ref 0.00–1.49)

## 2011-01-08 LAB — HEPARIN LEVEL (UNFRACTIONATED)
Heparin Unfractionated: 0.21 IU/mL — ABNORMAL LOW (ref 0.30–0.70)
Heparin Unfractionated: 0.32 IU/mL (ref 0.30–0.70)

## 2011-01-08 LAB — APTT: aPTT: 59 seconds — ABNORMAL HIGH (ref 24–37)

## 2011-01-08 NOTE — Op Note (Signed)
[name], [name]                 ACCOUNT NO.:  [id number]   MEDICAL RECORD NO.:  [id number]          PATIENT TYPE:  AMB   LOCATION:  SDS                          FACILITY:  [facility]   PHYSICIAN:  Adolph Pollack, M.D.DATE OF BIRTH:  1931-03-05   DATE OF PROCEDURE:  05/21/2007  DATE OF DISCHARGE:  05/21/2007                               OPERATIVE REPORT   PREOPERATIVE DIAGNOSIS:  Left inguinal hernia.   POSTOPERATIVE DIAGNOSIS:  [place] left inguinal hernia.   PROCEDURE:  Left inguinal hernia with mesh.   SURGEON:  Adolph Pollack, M.D.   ANESTHESIA:  General by way of LMA and Marcaine for local.   INDICATIONS:  [name] is a 77 year old male was doing some heavy work  during the summer and then noticed a bulge in the left groin that became  symptomatic.  On examination he has a left inguinal hernia, it is  reducible, and now presents for repair of his symptomatic left inguinal  hernia.  We discussed the procedure, risks and aftercare preoperatively.   TECHNIQUE:  He was seen in the holding area and the left groin marked  with my initials.  He was then brought to the operating room, placed  supine on the operating table, and a general anesthetic was  administered.  The hair in the left groin was clipped and the area  sterilely prepped and draped.  Marcaine solution was infiltrated  superficially and deep in the left groin.  A left groin incision was  made dividing the skin, subcutaneous tissue and Scarpa fascia and  exposing the external oblique aponeurosis.  Local anesthetic was  infiltrated distal to the external oblique aponeurosis and an incision  was made in the external oblique aponeurosis through the external ring  medially and up to the anterior superior iliac spine laterally.  Using  blunt dissection, the shelving edge of the inguinal ligament was exposed  inferiorly and the internal oblique muscle and aponeurosis exposed  superiorly.  The ilioinguinal nerve  was identified and retracted  inferiorly out of the plane of dissection.  The spermatic cord was then  isolated and encircled and a window created around it.  An indirect sac  was noted as well as the direct defect.  The indirect sac was carefully  dissected free from the spermatic cord and replaced through enlarged  internal ring.  A lipoma of the cord was also resected.   Following this a piece of 3 x 6-inch polypropylene mesh was brought into  the field and anchored 1-2 cm medial to the pubic tubercle with 2-0  Prolene suture.  The inferior aspect of the mesh was anchored to the  shelving edge of the inguinal ligament with a running 2-0 Prolene suture  up to a level 1-2 cm lateral to the internal ring.  A slit was cut in  the mesh and two tails were wrapped around the spermatic cord.  The  superior aspect of the mesh was anchored to the internal oblique muscle  and aponeurosis with interrupted 2-0 Vicryl sutures.  Following this the  two tails of the  mesh were crossed creating a new internal ring, and  these in turn were anchored to the shelving edge of the inguinal  ligament with a single 2-0 Prolene suture.  The tip of a hemostat was  able to be placed through a new aperture.   The lateral aspect of mesh was then tucked deep to the external oblique  aponeurosis.  Hemostasis was noted to be adequate.  The external oblique  aponeurosis was then closed with a running 3-0 Vicryl suture.  Scarpa  fascia was  closed with a running 2-0 Vicryl suture.  The skin was closed with a 4-0  Monocryl subcuticular stitch.  Steri-Strips and sterile dressings were  applied.   He tolerated the procedure well without apparent complications and was  taken to recovery in satisfactory condition.      Adolph Pollack, M.D.  Electronically Signed     TJR/MEDQ  D:  05/21/2007  T:  05/22/2007  Job:  [id number]   cc:   Maryelizabeth Rowan, MD  Ricki Rodriguez, M.D.

## 2011-01-09 ENCOUNTER — Inpatient Hospital Stay (HOSPITAL_COMMUNITY): Payer: Medicare Other

## 2011-01-09 LAB — PROTIME-INR
INR: 2.21 — ABNORMAL HIGH (ref 0.00–1.49)
Prothrombin Time: 24.7 seconds — ABNORMAL HIGH (ref 11.6–15.2)

## 2011-01-09 LAB — CBC
MCV: 87.9 fL (ref 78.0–100.0)
Platelets: 189 10*3/uL (ref 150–400)
RDW: 14.1 % (ref 11.5–15.5)
WBC: 11.6 10*3/uL — ABNORMAL HIGH (ref 4.0–10.5)

## 2011-01-09 LAB — HEPARIN LEVEL (UNFRACTIONATED): Heparin Unfractionated: 0.34 IU/mL (ref 0.30–0.70)

## 2011-01-10 ENCOUNTER — Inpatient Hospital Stay (HOSPITAL_COMMUNITY): Payer: Medicare Other

## 2011-01-10 DIAGNOSIS — J930 Spontaneous tension pneumothorax: Secondary | ICD-10-CM

## 2011-01-10 LAB — BASIC METABOLIC PANEL
BUN: 24 mg/dL — ABNORMAL HIGH (ref 6–23)
CO2: 32 mEq/L (ref 19–32)
Chloride: 98 mEq/L (ref 96–112)
Creatinine, Ser: 1.06 mg/dL (ref 0.4–1.5)
GFR calc Af Amer: >60 mL/min (ref >60)

## 2011-01-10 LAB — CBC
MCH: 28.6 pg (ref 26.0–34.0)
MCHC: 32.5 g/dL (ref 30.0–36.0)
Platelets: 204 10*3/uL (ref 150–400)

## 2011-01-10 LAB — HEPARIN LEVEL (UNFRACTIONATED): Heparin Unfractionated: 0.11 IU/mL — ABNORMAL LOW (ref 0.30–0.70)

## 2011-01-11 ENCOUNTER — Inpatient Hospital Stay (HOSPITAL_COMMUNITY): Payer: Medicare Other

## 2011-01-11 LAB — CBC
HCT: 37.2 % — ABNORMAL LOW (ref 39.0–52.0)
Hemoglobin: 12.1 g/dL — ABNORMAL LOW (ref 13.0–17.0)
MCH: 28.5 pg (ref 26.0–34.0)
MCHC: 32.5 g/dL (ref 30.0–36.0)
MCV: 87.5 fL (ref 78.0–100.0)
Platelets: 220 K/uL (ref 150–400)
RBC: 4.25 MIL/uL (ref 4.22–5.81)
RDW: 14.1 % (ref 11.5–15.5)
WBC: 13.9 K/uL — ABNORMAL HIGH (ref 4.0–10.5)

## 2011-01-11 LAB — PROTIME-INR: INR: 1.38 (ref 0.00–1.49)

## 2011-01-11 NOTE — Discharge Summary (Signed)
[name], [name]                           ACCOUNT NO.:  [id number]   MEDICAL RECORD NO.:  [id number]                   PATIENT TYPE:  INP   LOCATION:  0375                                 FACILITY:  [facility]   PHYSICIAN:  Mark C. Vernie Ammons, M.D.               DATE OF BIRTH:  09/12/1930   DATE OF ADMISSION:  06/07/2002  DATE OF DISCHARGE:                                 DISCHARGE SUMMARY   PRINCIPAL DIAGNOSIS:  Adenocarcinoma of the prostate.   OTHER DIAGNOSES:  1. Coronary artery disease.  2. Atrial fibrillation.  3. Chronic obstructive pulmonary disease.  4. Hypercholesterolemia.   OPERATION:  Radical retropubic prostatectomy and bilateral pelvic lymph node  dissection.  The pathology revealed lymph nodes were negative for any  evidence of metastatic spread, and the tumor was contained to the prostate,  being seen in both lobes, but only a very small amount on the left side.  The surgical margins were negative.  His pathologic stage is T3C.   DISPOSITION:  The patient is discharged home in stable, satisfactory, and  improved condition.  He had mild temperature elevation, but has known  chronic obstructive pulmonary disease, has some secretions that he is  working on mobilizing, but otherwise his lungs are clear.  No sign of  infection.  He has a Foley catheter indwelling which will remain, and his  skin staples will be removed at the time of his followup in my office in one  week.   DISCHARGE MEDICATIONS:  1. Colace 100 mg b.i.d. #24.  2. Cipro 500 mg b.i.d. #24.  3. Tylox one or two q.4h. p.r.n. #36.  4. Coumadin.   DIET:  Routine preoperative diet.   ACTIVITY:  Limited to no heavy lifting, strenuous activity, up and down  stairs, or driving.   HISTORY OF PRESENT ILLNESS:  The patient is a 77 year old white male who had  an elevated PSA of 22, but a benign feeling prostate on examination.  He was  found by ultrasound to have no evidence of local extension of disease  nor  any suspicious lesions, however, biopsy revealed Gleason 7 adenocarcinoma in  10% of the specimen from the right side.  CT scan and bone scan were both  negative for metastatic disease, and he was therefore admitted for elective  radical retropubic prostatectomy.  His full H&P was previously dictated, and  will not be repeated here.   HOSPITAL COURSE:  On 06/07/02, the patient was given preoperative  subcutaneous heparin and antibiotics and was taken to the OR where he  underwent a radical retropubic prostatectomy, bilateral pelvic lymph node  dissection.  At the time of surgery, there was a suspicious appearing node  in the right obturator fossa which was sent for frozen section  intraoperatively.  This revealed no evidence of metastatic adenocarcinoma.  I therefore proceeded with his prostatectomy which proceeded smoothly with  no  intraoperative complications nor the need for transfusion.  Postoperatively, he was noted to be doing well in the recovery room, and  then the night of surgery had minimal drainage from his pelvic drain.  His  urine was draining clear from his catheter.  That night of his surgery he  had diminished urine output which intraoperatively I noted was subjectively  less than typical.  The anesthesiologist at the time confirmed that he was  being run somewhat dry due to his cardiac history.  His catheter was  irrigated, and returned clear with no clots, and therefore he was given a  bolus of fluid and his urine output picked up appropriately.  The following  day, he had no complaints.  His blood pressure was stable and he had good  urine output.  There was slight increase out of the JP drain, but his  dressing remained dry and his abdomen was soft.  His diet was advanced from  clear liquids to regular.  He was begun on some laxatives.  By the following  day, he was without complaint.  He had bowel movements with no nausea or  vomiting.  He was tolerating a  regular diet.  His drain output had decreased  to 15 cc per shift, and therefore it was removed.  His wound looked good  with no sign of infection, as did his drain site.  He did have a temperature  max of 101. the night before, felt to be pulmonary in origin, as he did have  some secretions that he was mobilizing, but no dullness found on chest  examination.  I discussed his pathology report with him, as well as the  implications, and at this time feel he is ready for discharge home.  He will  be discharged with his Foley catheter indwelling, and I discussed that with  him as well as the planned time for its removal, and he was therefore  discharged home.                                                 Mark C. Vernie Ammons, M.D.    MCO/MEDQ  D:  06/09/2002  T:  06/09/2002  Job:  [id number]   cc:   [name], MD  [address]  [place]  [place] [address]  Fax: [phone]

## 2011-01-11 NOTE — Discharge Summary (Signed)
[place]. [facility]  Patient:    [name], [name] Visit Number: [id number] MRN: [id number]          Service Type: EMS Location: [facility] Attending Physician:  Hanley Seamen Dictated by:   Tollie Pizza Collins, P.A.-C. Admit Date:  03/05/2002 Discharge Date: 03/05/2002   CC:         Earlene Plater L. Cloward, M.D.  Ricki Rodriguez, M.D.   Discharge Summary  PRIMARY ADMITTING DIAGNOSIS:  Severe coronary artery disease including left main coronary disease.  ADDITIONAL DIAGNOSES: 1. Hypertension. 2. Chronic atrial fibrillation. 3. Hyperlipidemia. 4. History of two prior cerebrovascular accidents. 5. History of multiple transient ischemic attacks. 6. Postoperative bronchitis. 7. History of tobacco abuse. 8. History of prostate cancer.  PROCEDURES PERFORMED: 1. Cardiac catheterization. 2. Coronary artery bypass graft x2 (left internal mammary    artery to the LAD, saphenous vein graft to the third obtuse    marginal). 3. Ligation of left atrial appendage.  HISTORY:  The patient is a 77 year old male who was recently diagnosed with prostate cancer.  In the course of his preoperative workup for surgery for his prostate cancer, he was noted to have abnormal stress test.  He underwent cardiac catheterization on June 12, which showed 80-90% left main coronary stenosis with 80% stenosis of the diagonal.  Upon further questioning, the patient complained of shortness of breath with exertion, but denied any history of chest pain.  He is admitted for further evaluation.  HOSPITAL COURSE:  A cardiothoracic surgery consultation was obtained and it was agreed that he should proceed with CABG at this time.  He was taken to the operating room on June 23, where he underwent CABG x2 with ligation of his left atrial appendage.  He tolerated the procedure well and was transferred to the SICU in stable condition.  Postoperatively, his main difficulty has been his pulmonary status.   He has had high oxygen requirements.  He was initially treated with diuresis and aggressive pulmonary toilet measures.  He developed a low grade fever with a productive cough, therefore, he was placed on Maxipime.  He was able to be transferred to the floor late in the day on postoperative day #2.  He has continued to make slow improvements.  He was ultimately weaned off supplemental oxygen and was able to maintain O2 saturations of around 90-91% on room air.  His only other postoperative difficulty was some atrial fibrillation which has been rate controlled in the 70s-90s.  He was able to restart his Coumadin and at the time of discharge, his INR was 1.8.  Otherwise, his incisions were healing well.  He has been ambulating in the halls without difficulty.  He has remained afebrile and other vital signs have been stable.  It is felt that he can be discharged home at this time.  DISCHARGE MEDICATIONS: 1. Coumadin 5 mg q.d. 2. Toprol XL 25 mg q.d. 3. Lanoxin 0.125 mg q.d. 4. Combivent 1-2 puffs q.i.d. 5. Tylox 1-2 q.4h. p.r.n. for pain.  DISCHARGE INSTRUCTIONS:  He is to refrain from driving, heavy lifting, or strenuous activity.  He may continue a low-fat, low-sodium diet.  He is asked to shower daily, and clean his incisions with soap and water.  DISCHARGE FOLLOWUP:  He will see Dr. Algie Coffer in the office in two weeks and home health nursing will be arranged to draw PT and INR on Monday.  Call to Dr. Della Goo for further Coumadin dosage.  He will see Dr. Dorris Fetch  in the office in three weeks and he is asked to have a chest x-ray at [facility] one hour prior to this appointment. Dictated by:   Tollie Pizza Collins, P.A.-C. Attending Physician:  Hanley Seamen DD:  03/08/02 TD:  03/10/02 Job: [id number]

## 2011-01-11 NOTE — H&P (Signed)
NAMECORBET, Corey                 ACCOUNT NO.:  [id number]   MEDICAL RECORD NO.:  [id number]          PATIENT TYPE:  INP   LOCATION:  0103                         FACILITY:  [facility]   PHYSICIAN:  Toby L. Fugate, D.O.   DATE OF BIRTH:  1930-12-14   DATE OF ADMISSION:  09/28/2005  DATE OF DISCHARGE:                                HISTORY & PHYSICAL   PRIMARY CARE PHYSICIAN:  [facility], [name], M.D.   CHIEF COMPLAINT:  Shortness of breath and productive cough.   HISTORY OF PRESENT ILLNESS:  Mr. [name] is a 77 year old Caucasian male who  presents to [facility] emergency room after having been seen at [facility]  earlier today.  At [facility], Mr. [name] was diagnosed with pneumonia.  Dr.  Andi Devon recommended that the patient present to ED for possible admission.  Here in the ED, the patient's chest x-ray, from Dr. Augustin Schooling office, was  reviewed.  The patient has a left lower lobe consolidation consistent with  pneumonia.  His O2 sat, here in the ED, was 87% on room air.  He says that  he has been having increased shortness of breath over the past three to four  days.  He began to have a productive cough.  His sputum was yellow.  He  admits to subjective fevers and chills.  He denies any chest pain.  He does  have the shortness of breath mentioned above.  The shortness of breath is  constant and does not change with exertion or positioning.  He denies  swelling of his lower extremities.   PAST MEDICAL HISTORY:  1.  COPD.  2.  Spontaneous pneumothorax.  3.  Coronary artery disease, status post CABG in 2003.  4.  Hypertension.  5.  Atrial fibrillation on Coumadin.  6.  Hyperlipidemia.  7.  Tobacco abuse.  8.  Prostate cancer, status post prostatectomy in 2003.  9.  Stroke in 1998 and 2002.  10. Appendectomy in 1959.  11. Hernia repair.   MEDICATIONS:  1.  Coumadin 6 mg p.o. daily.  2.  Lisinopril 10 mg p.o. daily.  3.  Lipitor 10 mg p.o. daily.  4.  Hydrochlorothiazide  12.5 mg p.o. daily.  5.  Multivitamin p.o. daily.  6.  Cardizem CD 120 mg p.o. daily.   ALLERGIES:  No known drug allergies.   SOCIAL HISTORY:  He smokes one pack a day for 50 years now.  He denies  alcohol and IV drug abuse.   FAMILY HISTORY:  Mother died at age 53 due to coronary artery disease.  Father died at age 73 due to complications of diabetes.   REVIEW OF SYSTEMS:  A complete review of systems was obtained.  The patient  admitted to shortness of breath, productive cough, fevers, chills, otherwise  the review of systems was negative.   PHYSICAL EXAMINATION:  VITAL SIGNS:  Temperature 98.5, blood pressure  135/77, pulse 94, respiratory rate 28.  HEENT:  Pupils are equally round and reactive to light.  Extraocular muscles  intact.  No scleral icterus.  Oropharynx clear, moist.  No erythema or  thrush.  NECK:  No JVD.  No carotid bruit.  No adenopathy.  HEART:  Irregularly irregular (known history of atrial fibrillation).  No  murmurs, rubs, or gallops.  LUNGS:  Left lower lobe crackles, otherwise clear.  ABDOMEN:  Positive bowel sounds.  Nontender, nondistended.  EXTREMITIES:  No edema, clubbing, or cyanosis.  NEUROLOGIC:  Cranial nerves II-XII intact.  There is no focal deficits.  DTRs 2/4 in all extremities.  Strength 5/5 all extremities.   LABORATORY:  White blood cell count 15.2, hemoglobin 14.1, hematocrit 42.3,  platelets 374, neutrophils 79%, lymphocytes 11%.  Sodium 136, potassium 4.1,  chloride 105, CO2 24, glucose 110, BUN 13, creatinine 1, calcium 9.2.  Chest  x-ray showed left lower lobe consolidation.   ASSESSMENT/PLAN:  1.  Community-acquired pneumonia.  I will admit the patient to a general      medicine bed.  I will initiate Rocephin and azithromycin.  The patient      did receive Avelox in the emergency department.  I will request a sputum      culture.  Blood cultures x2 have already been obtained.  I will continue      the patient on 2 liters of O2 by  nasal cannula.  I will provide      breathing treatments with albuterol and Atrovent every six hours and as      needed.  2.  Atrial fibrillation.  Rate is relatively controlled on Cardizem.  The      patient is also on Coumadin.  I will monitor the patient as needed.  3.  Coumadin therapy.  I will continue the patient on his current dose of      Coumadin.  I will check a daily INR.  4.  Coronary artery disease, status post coronary artery bypass graft.  No      complaint of chest pain.  5.  Hypertension, controlled.  I will continue the patient on      hydrochlorothiazide and Lisinopril.  6.  Hyperlipidemia.  I will continue the patient on Lipitor.  7.  This patient is a full code.      Toby L. Fugate, D.O.  Electronically Signed     TLF/MEDQ  D:  09/28/2005  T:  09/28/2005  Job:  [id number]

## 2011-01-11 NOTE — Op Note (Signed)
[place]. [facility]  Patient:    [name], [name] Visit Number: [id number] MRN: [id number]          Service Type: MED Location: 2300 [id number] [id number] Attending Physician:  Charlett Lango Dictated by:   Salvatore Decent. Dorris Fetch, M.D. Proc. Date: 02/15/02 Admit Date:  02/10/2002   CC:         Ricki Rodriguez, M.D.  [name], M.D.   Operative Report  PREOPERATIVE DIAGNOSIS:  Left main disease and chronic atrial fibrillation.  POSTOPERATIVE DIAGNOSIS:  Left main disease and chronic atrial fibrillation.  OPERATION PERFORMED:  Median sternotomy, extracorporeal circulation.  Coronary artery bypass grafting times two (left internal mammary artery to left anterior descending, saphenous vein graft to distal obtuse marginal), ligation of left atrial appendage.  SURGEON:  Salvatore Decent. Dorris Fetch, M.D.  ASSISTANTTollie Pizza. Thomasena Edis, P.A.  ANESTHESIA:  General.  OPERATIVE FINDINGS:  Severe intrapleural and intrapericardial adhesions, good quality targets, good quality conduits.  INDICATIONS FOR PROCEDURE:  The patient is a 77 year old gentleman with a longstanding history of atrial fibrillation, multiple previous neurologic events including two strokes with good recovery, who was newly diagnosed with prostate cancer.  He was scheduled to undergo surgery for his prostate cancer but during his preoperative work-up had a stress test which was abnormal.  He subsequently underwent cardiac catheterization which revealed critical left main stenosis.  The patient was admitted and referred for coronary artery bypass grafting.  The indications, risks, benefits and alternatives were discussed in detail with the patient and he understood and accepted the risks and agreed to proceed.  DESCRIPTION OF PROCEDURE:  The patient was brought to the preop holding area on February 15, 2002.  Lines were placed to monitor arterial, central venous and pulmonary arterial pressure.  EKG  leads were placed for continuous telemetry. The patient was taken to the operating room, anesthetized and intubated.  A Foley catheter was placed without difficulty.  Intravenous antibiotics were administered.  The chest, abdomen and legs were prepped and draped in the usual fashion.  A median sternotomy was performed and the left internal mammary artery was harvested using standard techniques.  Of note there were marked intrapleural adhesions in the left chest.  These were taken down to the extent necessary to allow harvest of the mammary artery.  Adhesions to the pericardium were taken off to allow the mammary artery to reach the heart. Simultaneously an incision was made in the medial aspect of the right leg and the greater saphenous vein was harvested.  Both the mammary and saphenous vein were of excellent quality.  The patient was fully heparinized prior to dividing the distal end of the mammary artery.  There was excellent flow through the cut end of the vessel.  The pericardium was opened.  There were dense intrapericardial adhesions. These were taken down using sharp dissection.  After achieving adequate exposure for cannulation, the aorta was cannulated via concentric 2-0 Ethibond pledged pursestring sutures.  There was palpable atherosclerotic disease in the distal ascending aorta and the cannula was placed more proximally than normal.  There was no palpable disease at the site of the cannulation.  A pursestring suture was placed on the right atrial appendage and a dual stage venous cannula was placed.  Cardiopulmonary bypass was instituted and the patient was cooled to 34 degrees Celsius.  The remainder of the intrapericardial adhesions were lysed.  The distal most obtuse marginal branch of the left circumflex was the largest marginal branch.  It was identified and the vein graft was cut to length.  A foam pad was placed in the left pericardium.  A temperature probe was placed  in the myocardial septum and a cardioplegia cannula was placed in the ascending aorta.  The aorta was cross clamped.  The left ventricle was emptied via the left aortic root vent.  Cardiac arrest then was achieved with a combination of cold antegrade blood cardioplegia and topical iced saline.  There was rapid septal cooling and rapid diastolic arrest.  After achieving complete diastolic arrest and adequate myocardial cooling the following distal anastomoses were performed.  First a reversed saphenous vein was placed end-to-side to the distal obtuse marginal.  This was the dominant posterolateral branch.  It was 2 mm in diameter.  The vein graft was of good quality.  The anastomosis was performed with a running 7-0 Prolene suture.  At the completion of the anastomosis there was excellent flow through the graft.  Cardioplegia was administered.  There was good hemostasis at the anastomosis.  Next, the left atrial appendage was identified, given the patients history of chronic atrial fibrillation and refusal to undergo maze procedure, the left atrial appendage was ligated at its base with multiple 4-0 Prolene pledgeted sutures.  Next, the left internal mammary artery was brought through a window in the pericardium.  The distal end was spatulated.  It was anastomosed end-to-side to the distal LAD.  The distal LAD was a 2 mm good quality target.  The mammary was of good quality.  Anastomosis performed with an 8-0 running Prolene suture.  At the completion of the mammary to LAD anastomosis, the bulldog clamp was briefly removed from the mammary artery.  Immediate and rapid septal rewarming was noted.  The bulldog clamp was replaced.  The mammary pedicle was tacked to the epicardial surface of the heart with 6-0 Prolene sutures.  Additional cardioplegia was administered.  The cardioplegia cannula was removed from the ascending aorta.  Proximal vein graft anastomosis  was performed to a 4.4  mm punch aortotomy with a running 6-0 Prolene suture. At the completion of the proximal anastomosis, the bulldog clamp was once again removed from the mammary artery.  Again, rapid septal rewarming was noted.  Lidocaine was administered.  The patient was placed in Trendelenburg position. The aortic root was deaired and the crossclamp was removed.  Total crossclamp time was 53 minutes.  The patient spontaneously converted back to atrial fibrillation with a controlled ventricular response.  The patient was rewarmed.  All proximal and distal anastomoses and the left atrial suture line were inspected for hemostasis.  Epicardial pacing wires were placed on the right ventricle and atrium.  When the core temperature reached 37 degrees Celsius, the patient was weaned from cardiopulmonary bypass, weaned from bypass without inotropic support.  The total bypass time was 90 minutes.  Initial cardiac index was greater than 3L per minute per meter squared.  The patient remained hemodynamically stable throughout the postbypass period.  A test dose of protamine was administered and well tolerated.  The atrial and aortic cannulae were removed.  The remainder of the protamine was administered without incident.  The chest was irrigated with 1L of warm normal saline containing of 1 gm of vancomycin.  Hemostasis was achieved.  The pericardium was reapproximated with interrupted 3-0 silk sutures.  The left pleural and two mediastinal chest tubes were placed through separate subcostal incisions. The sternum was closed with heavy gauge interrupted stainless steel wires. The pectoralis  fascia was closed with running #1 Vicryl suture. Subcutaneous tissues were closed with running 2-0 Vicryl suture and the skin was closed with a 3-0 Vicryl suture subcuticular suture.  All sponge, needle and instrument counts were correct at the of the procedure.  The patient was taken from the operating room to the surgical  intensive care unit intubated and in stable condition. Dictated by:   Salvatore Decent Dorris Fetch, M.D. Attending Physician:  Charlett Lango DD:  02/15/02 TD:  02/16/02 Job: [id number]

## 2011-01-11 NOTE — Discharge Summary (Signed)
[name], [name] NO.:  [id number]   MEDICAL RECORD NO.:  [id number]          PATIENT TYPE:  INP   LOCATION:  3316                         FACILITY:  [facility]   PHYSICIAN:  [name], M.D.  DATE OF BIRTH:  01-02-1931   DATE OF ADMISSION:  06/26/2005  DATE OF DISCHARGE:                                 DISCHARGE SUMMARY   PRIMARY MEDICAL DOCTOR:  [name], M.D., [facility]   DISCHARGE DIAGNOSES:  1.  Right lower lobe community acquired pneumonia.  2.  Exacerbation of chronic obstructive pulmonary disease.  3.  A 15% spontaneous right-sided pneumothorax; status post chest tube      insertion June 28, 2005.  4.  History of coronary artery disease; status post coronary artery bypass      graft 2003.  5.  Hypertension.  6.  Rate-controlled atrial fibrillation.  7.  Dyslipidemia.  8.  Smoking history.   DISCHARGE MEDICATIONS:  1  Coumadin per INR, currently on 6 mg p.o. daily.  1.  Aspirin 81 mg p.o. daily.  2.  Lipitor 10 mg p.o. q.h.s.  3.  Cardizem CD 120 mg p.o. daily.  4.  Multivitamin one p.o. daily.  5.  Lisinopril 10 mg p.o. daily.  6.  Hydrochlorothiazide 12.5 mg p.o. daily.  7.  Avelox 400 mg one dose on July 02, 2005.  8.  Nicoderm CQ patch (21 mg/24 hour) one patch to skin daily.  9.  Prednisone  40 mg p.o. daily x2 days then 30 mg p.o. daily x3 days, 20      mg p.o. daily x3 days, 10 mg p.o. daily x3 days, 5 mg p.o. daily x3 days      then stop.  10. Combivent inhaler. 2puffs q.i.d.  11. Lovenox 80mg  s.c. b.i.d. till INR is therapeutic.   PROCEDURES:  1.  Chest x-ray dated June 26, 2005, which showed loculated right-sided      pneumothorax approximately 15%, also, severe COPD and right lower lobe      atelectasis on filtrate.  2.  Portable chest x-ray dated June 27, 2005, showing essentially the      same findings.  3.  Right chest tube insertion June 27, 2005, by Dr. [name], CVTS.  4.  Chest x-ray  dated June 27, 2005, status post right chest tube      insertion.  This showed large new lateral right chest tube in good      position with near complete resolution of right pneumothorax which is      now estimated at less than 5% and it is located in the apical and      lateral upper lung regions.  5.  Chest x-ray dated June 28, 2005, showed right apical pneumothorax,      about 5%, no significant change.  6.  Chest x-ray dated June 29, 2005.  No marked change.  7.  Chest x-ray dated June 30, 2005.  Stable right pneumothorax, slightly      increased left lower lobe opacity.  8.  Status post removal of right-sided chest  tube.  9.  On June 30, 2005, postprocedure chest x-ray showed resolved right      pneumothorax, no residual seen after chest tube removal.  10. Chest x-ray dated July 01, 2005, showed less than 5% right apical      pneumothorax.   CONSULTATIONS:  Dr. [name], M.D., cardiothoracic and vascular surgery.   HISTORY AND PHYSICAL:  As in H&P notes of June 26, 2005.  However, in  brief, this is a 77 year old male with known history of chronic obstructive  pulmonary disease and heavy smoking, also coronary artery disease,  hypertension, dyslipidemia, chronic atrial fibrillation, on Coumadin  treatment, who presents with increased shortness of breath and cough for  approximately two weeks' duration, now worse.  He was referred to the  emergency department by his primary M.D. and was admitted for evaluation,  investigation and management.   HOSPITAL COURSE:  #1 - COMMUNITY ACQUIRED RIGHT LOWER LOBE PNEUMONIA.  The  patient presents with history of increasing shortness of breath and cough.  Chest x-ray dated June 27, 2005, showed loculated right-sided  pneumothorax approximately 15%, severe COPD, and right lower lobe  infiltrate.  He was therefore commenced on treatment for community-acquired  pneumonia with Avelox, bronchodilators, nebulizers.   #2 -  EXACERBATION OF CHRONIC OBSTRUCTIVE PULMONARY DISEASE SECONDARY TO #1  ABOVE.  Management was instituted as above.  The patient was also placed  initially, on parenteral steroids which, after a couple of days was switched  to oral steroid taper, with satisfactory clinical response.  As noted, the  patient is a heavy smoker.  He was commenced on transdermal nicotine patch  while in hospital and counseled adequately.   #3 - RIGHT-SIDED SPONTANEOUS PNEUMOTHORAX.  Chest x-ray dated June 30, 2005, showed loculated right-sided pneumothorax of approximately 15%.  This  was clearly inimicable to patient's respiratory status and contributed in  great measure to shortness of breath in this patient with severe COPD and  diminished respiratory reserve.  Cardiothoracic surgical consultation was  requested.  This was kindly provided by Dr. [name] who concluded that the  patient would benefit from a right-sided chest drain.  After some initial  reluctance, the patient agreed.  His INR was somewhat elevated at the time  of initial presentation.  Coumadin was discontinued, anticoagulation was  reversed with 2 units of fresh frozen plasma, and the patient underwent  chest tube placement on June 27, 2005, with dramatic response in clinical  status.  Chest tube was subsequently discontinued on June 30, 2005.  Initial x-ray, postprocedure, showed complete resolution of pneumothorax.  However, on July 01, 2005, chest x-ray shows less than 5% right-sided  apical pneumothorax.  The patient, however, has shown no evidence of  clinical deterioration.   #4 - HISTORY OF CHRONIC ATRIAL FIBRILLATION AND CHRONIC ANTICOAGULATION.  The patient is rate controlled and shows no clinical evidence of heart  failure. He continues on his pre-admission dose of Cardizem CD.  INR on  June 27, 2005, was 2.8, i.e., therapeutic.  However, because of contemplated right-sided chest tube placement, it is felt appropriate  to  reverse this.  This was done with 2 units of fresh frozen plasma and  following chest tube placement, the patient was placed on bridging heparin  therapy initially intravenously.  On June 28, 2005, as there were no  complications, he was switched to subcutaneous therapeutic Lovenox b.i.d.  and INR continued to drift downwards.  Following chest tube removal on  June 30, 2005, the  patient was restarted on oral anticoagulation with  Coumadin.  INR as of July 01, 2005, was subtherapeutic at 1.6.  It is  anticipated that on discharge, the patient will continue subcutaneous  therapeutic Lovenox until INR is therapeutic, at which time Lovenox may be  discontinued.  Patient is a retired Lawyer, and has indicated that he is quite  capable of administering the injections himself.  We expect his  anticoagulation to be monitored subsequently, by his PMD and medications  adjusted as indicated.   #5 - CORONARY ARTERY DISEASE.  The patient has remained asymptomatic from  this viewpoint.   #6 - DYSLIPIDEMIA.  The patient continues on statin.   DISPOSITION:  On July 01, 2005, the patient was entirely asymptomatic and  was raring to go.  Pneumonia is clearly resolving clinically. It is  anticipated that the patient will complete his course of Avelox on July 02, 2005.  The patient is likely to be discharged as soon as okayed by  cardiothoracic surgeon, which we anticipate will either be on November 6 or  7, 2006.   DISCHARGE INSTRUCTIONS:  1.  Diet:  Healthy Heart diet.  2.  Activity:  As tolerated.  3.  Pain management:  Not applicable.   FOLLOWUP:  The patient is instructed to follow up with his primary M.D., Dr.  Andi Devon, at [facility], within one week.  The patient is to  call for an appointment.  It is possible that he may need to follow up with  cardiothoracic surgeon, i.e., Dr. [name], but we will defer to Dr. Sharee Pimple  recommendations with regards to this.  It  is possible that the patient will  require a repeat chest x-ray certainly within the next week or sooner, in  order to confirm complete resolution of pneumothorax.  This is likely to be  carried out by his PMD.      [name], M.D.  Electronically Signed     CO/MEDQ  D:  07/01/2005  T:  07/01/2005  Job:  [id number]   cc:   [name], M.D.  Fax: [phone]   Evelene Croon, M.D.  [address]  [place]  [place] [address]

## 2011-01-11 NOTE — Op Note (Signed)
NAMEJOHNTHOMAS, LADER                           ACCOUNT NO.:  [id number]   MEDICAL RECORD NO.:  [id number]                   PATIENT TYPE:  OBV   LOCATION:  4540                                 FACILITY:  [facility]   PHYSICIAN:  Adolph Pollack, M.D.            DATE OF BIRTH:  April 17, 1931   DATE OF PROCEDURE:  09/10/2002  DATE OF DISCHARGE:                                 OPERATIVE REPORT   PREOPERATIVE DIAGNOSIS:  Right inguinal hernia.   POSTOPERATIVE DIAGNOSIS:  Indirect right inguinal hernia.   PROCEDURE:  Right inguinal hernia repair with mesh.   SURGEON:  Adolph Pollack, M.D.   ANESTHESIA:  General.   INDICATIONS:  The patient is a 77 year old male who in October underwent a  radical prostatectomy with lymph node dissection for prostate cancer. He had  a fairly routine recovery, but an enlarging bulge was noted in the right  groin. It is a hernia by examination and now he presents for repair. The  procedure and the risks were discussed with him preoperatively.   DESCRIPTION OF PROCEDURE:  In the holding area the right groin was marked.  He was brought to the operating room and placed on the operating table in  the supine position. General anesthesia was administered. His lower abdomen  was shaved and sterilely prepped and draped. Then  0.5% plain Marcaine was  infiltrated superficial and deep in the right groin region.   An oblique right groin scar was made, incising the skin sharply. The  subcutaneous tissue and Scarpa's fascia were divided with the cautery. The  external oblique aponeurosis was identified and local anesthetic was  infiltrated into it.   An incision was then made in the external oblique aponeurosis and carried  through the external ring medially and up toward the anterior superior iliac  spine laterally. Using blunt dissection, the internal oblique muscle and  aponeurosis were exposed superiorly and the shelving edge of the inguinal  ligament was  exposed inferiorly.   The spermatic cord was then isolated and a large indirect sac was noted. The  sac was fairly adherent to the cord. I dissected it free by using blunt  dissection and cautery.   I reduced it back into the extraperitoneal space, but it kept wanting to  rebound out. I subsequently excised the sac and ligated it with 2-0 Vicryl  suture. This was then more easily reducible back through the patulous  internal ring.   A piece of 3 x 6 inch mesh was then brought into the field. No direct or  femoral hernias were noted. The mesh was anchored 1 cm medial to the pubic  tubercle with 2-0 Prolene suture and the inferior aspect of the mesh was  anchored to the shelving edge of the inguinal ligament with a running 2-0  Prolene suture up to the level 1 cm lateral to the internal ring.   A slit  was then cut in the mesh and it was wrapped around the spermatic  cord. The superior aspect of the mesh was anchored to the internal oblique  muscle aponeurosis with interrupted 2-0 Vicryl sutures. The two tails of the  mesh were then crossed, creating a new internal ring, and these were  anchored to the shelving edge of the inguinal ligament with a single 2-0  Prolene suture. The new aperture allowed for the introduction of a hemostat  tip and the cord was mobile.   The lateral aspects of the mesh were then placed deep to the external  oblique aponeurosis laterally. Hemostasis was adequate. The ilioinguinal  nerve  was injected with some Marcaine. The external oblique aponeurosis was  then closed over the mesh and spermatic cord with running 3-0 Vicryl suture.  The Scarpa's fascia was closed with running 3-0 Vicryl suture. The skin was  closed with a 4-0 Monocryl subcuticular stitch. Steri-Strips and sterile  dressings were applied. The right testicle was in its normal position within  the scrotum.   The patient tolerated the procedure well without any apparent complications.  He was   taken to the recovery room in satisfactory condition. Because of his  history of chronic atrial fibrillation with rate control, he will be kept  overnight in the bed with telemetry monitoring.                                                Adolph Pollack, M.D.    Kari Baars  D:  09/10/2002  T:  09/10/2002  Job:  [id number]   cc:   [name]. [name], M.D.  [address], [address]  [place]  [place] [address]  Fax: [phone]   Ricki Rodriguez, M.D.  [address]  [place] [address]   [name], M.D.  [address]  [place]  [place] [address]  Fax: [phone]

## 2011-01-11 NOTE — Op Note (Signed)
[name], [name]                           ACCOUNT NO.:  [id number]   MEDICAL RECORD NO.:  [id number]                   PATIENT TYPE:  INP   LOCATION:  0375                                 FACILITY:  [facility]   PHYSICIAN:  Mark C. Vernie Ammons, M.D.               DATE OF BIRTH:  December 05, 1930   DATE OF PROCEDURE:  06/07/2002  DATE OF DISCHARGE:                                 OPERATIVE REPORT   PREOPERATIVE DIAGNOSES:  Prostate cancer.   POSTOPERATIVE DIAGNOSES:  Prostate cancer.   PROCEDURE:  1. Radical retropubic prostatectomy.  2. Bilateral pelvic lymph node dissection.   ANESTHESIA:  General.   SURGEON:  Mark C. Vernie Ammons, M.D.   ASSISTANT:  Melvyn Novas, M.D.   BRIEF HISTORY:  [name] is a 77 year old black male recently diagnosed  with a Gleason 7 T1C prostate cancer with a preoperative PSA over 20.  Metastatic workup has been negative. He has been counseled on the risks and  benefits of potential therapeutic modalities and has opted for surgical  intervention.   DESCRIPTION OF PROCEDURE:  After administration of IV antibiotics and  general anesthesia, [name] was prepped and draped in the supine position  with the table flexed. A lower midline incision was made beginning  approximately 2-3 cm below the umbilicus and extending inferiorly to over  the pubic symphysis. This incision was carried out using first scalpel and  then Bovie cautery down through the midline. Each half of the rectus muscles  were identified and retracted laterally. The transversalis fascia was  divided to expose the retropubic space and the bladder. Blunt dissection was  used to expose the pelvic sidewalls and the iliac veins. This was performed  bilaterally. There was acute good exposure of each iliac vein up to the vas  deferens. A Bookwalter retractor was then placed. We then addressed first  the patient's left and then right pelvic lymph nodes. The left pelvic lymph  nodes were taken by first  dividing the adventitia over the iliac vein and  then retracting the vein upward. Using blunt dissection and metal clips, the  pelvic lymph nodes were identified and removed. There was no sign of any  lymphatic leak or bleeding. The obturator nerve was identified and was not  injured. This was then repeated on the patient's right side without  difficulty. The nodes on the right hand side were enlarged and therefore all  lymph nodes were sent to pathology for frozen section analysis which  returned negative for metastatic carcinoma. We then proceeded with the  prostatectomy portion of the procedure. Using the Kitner and sponge stick,  the loose areolar tissue was gently taken off of the endopelvic fascia  lateral to the prostate bilaterally. The endopelvic fascia was then pierced  using Metzenbaum scissors and divided along the edges of the prostate on  both sides. The surgeon's fingers were then able to reach  down beside the  prostate on either side and bluntly dissect it free laterally. We were then  able to identify the groove between the urethra and Foley catheter and  overlying dorsal venous complex. A right angle clamp was then passed between  the urethra and dorsal venous complex and a #0 tie was used to tie off the  dorsal venous complex distally. The dorsal venous complex was then sharply  divided. This was exposed over the overlying anterior urethra which was  opened transversely using Bovie cautery down to the level of the Foley  catheter. A right angle was then passed below the posterior urethra which  was isolated using vascular tape. We then were able to put a right angled  clamp behind the Foley catheter and pull it upwards. The end of the Foley  catheter and balloon port were then cut and the Foley catheter was pulled  retrograde through the urethra until it was completely removed from the  urethra. The distal end remained in the bladder. Traction on the distal end  of the  Foley catheter was then used to pull the prostate upward allowing the  surgeon to divide the posterior urethra transversely just to the prostatic  apex exposing the underlying rectum. Blunt dissection was then used to  isolate the prostate medially from the underlying rectum. This plane was  easily obtained with a surgeon's finger dissecting down then to the left  lateral pedicles of the prostate which were taken down using metal clips and  Bovie cautery. Once the prostate was freed up laterally, it was pulled up  and superiorly exposing the underlying rectum. This also exposed the  posterior of the prostate and the posterior layer of the Denonvillier's  fascia. The posterior layer of Denonvillier's fascia was then scored and  incised using Bovie cautery and sharp dissection to expose the underlying  ampulla of the vas deferens and just lateral to them the seminal vesicles.  The ampulla of the vas deferens were isolated using right angled dissection.  They were then ligated with metal clips distally and divided sharply. We  then turned our attention to each vas deferens which were dissected from the  overlying prostate gland and underlying posterior Denonvilliers fascia. The  vas deferens were completely freed up and removed with the surgical  specimen. At this point, the prostate was attached only to the bladder neck  which was dissected out using both sharp dissection and Bovie cautery  dissection. Once the bladder neck was opened anteriorly, both ureteral  orifices were identified following the administration of indigo carmine. The  bladder neck was then dissected circumferentially and the prostate was then  removed. The Foley catheter was removed with the specimen. The bladder neck  was then addressed. The bladder neck was then everted to expose the mucosa  superiorly and laterally using interrupted 3-0 Vicryl sutures. The posterior aspect of the bladder neck was then reapproximated in  tennis racquet handle  fashion to decrease the size of the bladder neck. The mucosa was everted  around the circumference of the bladder neck. At that time, the Foley  catheter was inserted and was seen emanating from the urethral stump.  Anastomosis was performed using five interrupted 2-0 Vicryl anastomotic  sutures at the 4, 8, 10, 2 and 12 o'clock positions. After placement of the  suture with the ________ down, a Foley catheter was then inserted into the  bladder and the balloon was inflated. Each suture was then tied down. The  bladder was irrigated and there were no obvious leaks from the anastomosis.  At that time, the wound was irrigated and reinspected for any signs of  bleeding. There were none. Prior to placing the Foley catheter within the  bladder and tying down the sutures, a Prolene suture was tied around the  distal tip of the Foley catheter and the suture was brought out a separate  stab wound in the bladder and then brought out through the fascia and skin  on the patient's right side lateral to the incision where a button was tied  to it to secure the catheter in place. On the patient's left side, the  inferior and lateral aspect of the incision a separate stab wound was made  for placement of a JP drain which was placed in the usual fashion. This was  sutured in using a silk suture. The wound was then closed using running #1  PDS to close the rectus fascia. Skin closure was achieved using staples. The  patient was then cleaned and sterilely dressed. The procedure was then  terminated.   COMPLICATIONS:  None.   DRAINS:  JP Foley catheter attached to button.   DISPOSITION:  The patient was taken to the recovery room in stable  condition.     Melvyn Novas, M.D.                      Veverly Fells. Vernie Ammons, M.D.    DK/MEDQ  D:  06/07/2002  T:  06/07/2002  Job:  [id number]

## 2011-01-11 NOTE — H&P (Signed)
[facility]  Patient:    [name], [name]                          MRN: [id number] Adm. Date:  [id number] Attending:  Erich Montane CC:         [facility] in [place]   History and Physical  DATE OF BIRTH:  1931-06-30  CHIEF COMPLAINT:  This 77 year old right-handed white married male from [place], [place] is seen at the request of Dr. Chase Picket for evaluation of right facial numbness and weakness.  HISTORY OF PRESENT ILLNESS:  [name] is cared for by Dr. Earlene Plater for [facility].  He has been on long-term Coumadin for many years secondary to atrial fibrillation and had a right-brain stroke in July 1997 for which he was hospitalized at the [facility]. Hospital, a left-brain stroke in August 1997 for which he was hospital at the [facility]. Hospital, but otherwise has done well until the past two weeks when he developed problems with dizziness.  This was not a true spinning sensation.  There was no associated nausea and vomiting.  This morning he was noted by his wife to have a right facial droop without a headache, syncope, seizure, chest pain, or palpations, and came to the emergency room.  His INR was 1.5 on Coumadin.  PAST MEDICAL HISTORY:  Significant for hypertension x 2 years, atrial fibrillation x 25 years, operations for strabismus in 1960 and appendectomy in 1967, right-brain stroke in July 1997 and left-brain stroke in August 1997.  MEDICATIONS: 1. Hydrochlorothiazide 12.5 mg q.d. 2. Zestril 40 mg q.d. 3. Multivitamins one q.d. 4. Coumadin 5 mg q.d.  ALLERGIES:  He has no known history of allergies.  HABITS:  He does not drink alcohol.  He does smoke cigarettes - two packs per day which he has done for 55 years.  SOCIAL HISTORY:  He went through high school and took "some college."  He has been a Lawyer and retired in 1999.  He is married and had two children - he has a son 10 living and well and another son died at 19  from a gunshot wound.  He has a brother who died at 34 from diabetes mellitus and kidney failure.  He has a sister who is 46, has a pacemaker.  He has another sister who is 62, living and well.  His mother died at 16 from myocardial infarction.  His father died at 35 from cancer of the colon.  His past medical history is otherwise unremarkable.  REVIEW OF SYSTEMS:  Unremarkable except for recent dizziness.  PHYSICAL EXAMINATION:  GENERAL:  Revealed a well-developed white male.  VITAL SIGNS:  Blood pressure lying in the right and left arm of 90/60 and 90/50 with a heart rate of 75 and irregular.  There were no carotid or supraclavicular bruits heard.  The neck flexion and extension maneuver was unremarkable.  He was afebrile.  Respiratory rate was 18.  NEUROLOGIC:  Mental status revealed he was alert and oriented x 3 and followed one, two, and three-step commands.  Cranial nerve examination revealed visual fields to be full, disks flat.  Spontaneous venous pulsation seen.  He had an esotropia.  Corneals present.  There is no facial sensory asymmetry and a decrease in the right corner of his mouth versus the left.  Tongue was midline, uvula was midline.  Gags were present.  Sternocleidomastoid and trapezius testing were normal.  There was decreased hearing bilaterally. Motor examination revealed good strength to upper and lower extremities.  He had an outstretched hand and arm tremor.  Sensory examination was intact to pinprick, touch, extremity position, and vibration testing except there was an asymmetry on the right versus the left, with pinprick not felt as well on the right as compared to the left.  He had poor heel-to-shin bilaterally.  HEENT:  Revealed tympanic membranes were clear.  He was edentulous.  The thyroid was not enlarged.  LUNGS:  Clear to auscultation.  HEART:  Revealed no murmurs.  ABDOMEN:  Bowel sounds were normal.  GENITOURINARY:  He was  uncircumcised.  EXTREMITIES:  No cyanosis, clubbing, or edema.  LABORATORY DATA:  Revealed a protime with INR 1.5.  White blood cell count 13,300; hemoglobin 13.6; hematocrit 40.1; platelet count 289,000.  PTT 38.  EKG showed atrial fibrillation with septal infarct - age indeterminate.  IMPRESSION: 1. Left-brain transient ischemic attack - code 435.9. 2. 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 3. Old left-brain stroke in August 1997 - code 434.11. 4. Atrial fibrillation - code 427.31. 5. Hypertension - code 796.2. 6. Nontherapeutic protime. 7. Strabismus surgery in 1960. 8. History of appendectomy. 9. Benign essential tremor - code 333.1.  The plan at this time is to admit the patient for consideration of heparin therapy if MRI study of the brain shows no evidence of hemorrhage. DD:  01/30/01 TD:  01/31/01 Job: [id number]

## 2011-01-11 NOTE — Consult Note (Signed)
[place]. [facility]  Patient:    MALAKIE, BALIS Visit Number: [id number] MRN: [id number]          Service Type: MED Location: 2300 2399 [id number] Attending Physician:  Ricki Rodriguez Dictated by:   Salvatore Decent Dorris Fetch, M.D. Proc. Date: 02/12/02 Admit Date:  02/10/2002   CC:         Ricki Rodriguez, M.D.  Davis L. Cloward, M.D.   Consultation Report  REASON FOR CONSULTATION:  Left main coronary disease.  CHIEF COMPLAINT:  Abnormal stress test.  HISTORY OF PRESENT ILLNESS:  The patient is a 77 year old gentleman with multiple cardiac risk factors and chronic atrial fibrillation complicated by multiple cerebrovascular accidents and transient ischemic attacks.  He was being evaluated for surgery for prostate cancer and underwent a stress test. It showed significant ST changes and was associated with shortness of breath. He was advised to undergo cardiac catheterization, which was performed today, and it revealed an 80-90% left main stenosis distally.  There was 80% stenosis in a small superior branch of the first diagonal and no other hemodynamically significant lesions.  His left ventriculogram showed mild hypokinesia with an ejection fraction of 45% and he had mild mitral regurgitation.  The patient now is referred for coronary artery bypass grafting.  On questioning, the patient states that he has exertional dyspnea; he denies chest pain, tightness or pressure; he also has no jaw or arm or neck pain.  He states that he becomes short of breath with mild-to-moderate exertion that will be relieved within five minutes of resting and tends to be fairly consistent with the amount of exertion.  He has had no orthopnea, PND or peripheral edema.  PAST MEDICAL HISTORY:  His past medical history is significant for hypertension, tobacco abuse with a hundred-pack-year history, hypercholesterolemia, a positive family history of coronary artery disease, chronic  atrial fibrillation with two previous strokes approximately four years ago and multiple transient ischemic attacks.  He has had bilateral eye surgery, in the 1960s, and an appendectomy in 1968.  MEDICATIONS AT TIME OF ADMISSION: 1. Coumadin 5.5 mg p.o. q.d. 2. Hydrochlorothiazide 12.5 mg p.o. q.d. 3. Zestril 40 mg p.o. q.d. 4. Lipitor 10 mg p.o. q.d. 5. Multivitamins -- one daily.  ALLERGIES:  He has no known drug allergies.  FAMILY HISTORY:  His mother died in her 65s; she did have a history of coronary disease.  Father died of colon cancer at age 98.  SOCIAL HISTORY:  He is on his fifth marriage to his fourth wife; he has been married for five years.  He has a long history of tobacco abuse with 2 packs per day for 50 years.  He has no alcohol use.  REVIEW OF SYSTEMS:  He has had no recent change in his weight.  He does have some hearing loss, left greater than right.  He does wear glasses and dentures.  He has not had any recent stroke or TIA symptoms.  He does not have any residual deficit from his previous strokes.  He has had no cough.  He does occasionally have wheezing.  He does have dyspnea and sometimes palpitations. No syncope or presyncope.  No recent change in bowel or bladder habits.  No propensity for bleeding or bruising easily.  No history of DVT.  All other systems are negative.  PHYSICAL EXAMINATION:  GENERAL:  The patient is a 77 year old white male in no acute distress.  He is well-developed, well-nourished.  VITAL SIGNS:  His blood pressure is 98/60.  Pulse is 71 and regular. Respirations are 16.  NEUROLOGIC:  He is alert and oriented x3.  Smile is symmetric.  He has equal grip strength bilaterally and no focal motor deficits.  HEENT:  Normocephalic, atraumatic.  He is wearing glasses.  There are no other abnormalities.  He does have dentures.  NECK:  His neck is supple without thyromegaly, adenopathy or bruits.  There is no jugular venous  distention.  LUNGS:  Clear to auscultation and percussion.  There is no rhonchi or wheezing at the present time.  CARDIAC:  Irregularly irregular rhythm.  There is a normal S1.  There is a variable S2.  There is no murmur.  ABDOMEN:  Soft and nontender.  EXTREMITIES:  Without clubbing, cyanosis or edema.  He has no varicosities. He has 2+ posterior tibial pulses bilaterally.  SKIN:  Warm, pink and dry.  LABORATORY AND ACCESSORY DATA:  EKG shows atrial fibrillation, pulmonary disease pattern with left axis deviation and possible inferior infarct, age undetermined.  Sodium 139, potassium 5.6, BUN 26, creatinine 1.4, glucose 91.  White count 9.4, hematocrit 47, platelets 253,000.  PT initially was 22.5, on Coumadin. His CK was 87 with an MB of 2.4 and troponin of 0.01.  His total cholesterol was 137, LDL 68, HDL 42.  There are no carotid stenoses bilaterally.  His ABIs are greater than 1 in both lower extremities.  IMPRESSION:  The patient is a 77 year old with minimal cardiac symptoms, although he does have exertional dyspnea which sounds like it is more likely an anginal equivalent than chronic obstructive pulmonary disease.  He does have multiple cardiac risk factors and had an abnormal stress test during workup prior to his surgery for localized prostate disease.  At catheterization today, he was found to have an 80-90% distal left main stenosis.  Coronary artery bypass grafting is indicated for a survival benefit as well as relief of his dyspnea with exertion.  I have discussed in detail with the patient the operative approach as well as the indications, risks, benefits and alternative treatments.  He understands that the risks of coronary artery bypass grafting include, but are not limited to, death, stroke, myocardial infarction, deep venous thrombosis, pulmonary embolus, bleeding, possible need  for transfusion, infection, as well as other organ system  dysfunction including respiratory, renal, hepatic or gastrointestinal.  He understands and accepts these risks and agrees to proceed and we will plan to proceed with coronary artery bypass grafting for first case on Monday.  If the patient were to develop acute symptoms in the interim, he could be done on an emergent basis.  He will be maintained on a heparin drip until that time.  The second issue for the patient is his chronic atrial fibrillation; he has been in that for approximately 35 years.  He has had multiple complications and requires long-term Coumadin.  He has had two previous strokes, fortunately both of which he has had excellent recovery from, and also three transient ischemic attacks.  I discussed with him the possibility of a maze procedure with irrigated radiofrequency ablation, which is now available at this institution.  He understands approximately 80% success rate for conversion to sinus rhythm with a possibility to decrease his risk of stroke and possibly discontinuing anticoagulation in the future.  He understands that the risks of the maze procedure include, but are not limited to, heart block potentially requiring permanent pacemaker, persistent atrial fibrillation, increased risk for bleeding, risk of  coronary injury as well as risks associated with increased cardiopulmonary bypass and cross-clamp time.  I did discuss with the patient that I have assisted on this procedure and trained but not done one of these personally as of this time.  He wishes to consider this option and discuss it further over the weekend. Dictated by:   Salvatore Decent Dorris Fetch, M.D. Attending Physician:  Ricki Rodriguez DD:  02/12/02 TD:  02/15/02 Job: [id number]

## 2011-01-11 NOTE — Cardiovascular Report (Signed)
[place]. [facility]  Patient:    [name], [name] Visit Number: [id number] MRN: [id number]          Service Type: OUT Location: PADM Attending Physician:  Trisha Mangle Dictated by:   Ricki Rodriguez, M.D. Proc. Date: 02/12/02 Admit Date:  02/08/2002 Discharge Date: 02/08/2002                          Cardiac Catheterization  HOSPITAL LOCATION:  2924  REFERRING PHYSICIAN:  Dr. Christie Beckers  PROCEDURES: 1. Left heart catheterization. 2. Selective coronary angiography. 3. Left ventricular function study. 4. Ascending aortography.  INDICATIONS:  This 77 year old white male had a significantly abnormal stress test with EKG changes of ischemia/infarct and multiple cardiac risk factors.  APPROACH:  Right femoral artery with #6 French diagnostic catheters.  COMPLICATIONS:  None; however, a Smart needle was used for vascular access.  HEMODYNAMIC DATA: 1. The left ventricular pressure was 105/7. 2. The aortic pressure was 97/64.  LEFT VENTRICULOGRAM:  The left ventriculogram showed mild hypokinesia with mild mitral regurgitation.  Ejection fraction of 40-45%.  CORONARY ANATOMY: 1. The left main coronary artery showed 80-90% eccentric distal lesion with    calcification.  2. Left anterior descending artery:  The left anterior descending artery    showed mild narrowing.  The diagonal #1 vessel, the superior branch, had an    80% ostial lesion.  3. Left circumflex coronary artery:  The left circumflex coronary artery had    mild proximal and mid vessel disease.  The ramus branch had a diffuse    narrowing post origin.  4. Right coronary artery:  The right coronary artery had 20% ostial lesion    and mild proximal and mid vessel disease.  5. Left internal mammary artery via aortic arch injection showed the artery to    be patent.  IMPRESSION: 1. Severe left main coronary artery disease. 2. Mild left ventricular systolic dysfunction. 3. Mild  mitral regurgitation.  RECOMMENDATIONS:  This patient is agreeable, will undergo coronary artery bypass graft surgery.  The patients referring doctor, Dr. Christie Beckers, was also notified to postpone patients cancer of the prostate surgery.  The patient will be treated with IV heparin while waiting for his surgery due to history of atrial fibrillation and severe coronary artery disease.  Plavix and aspirin were discontinued in preparation for the surgery. Dictated by:   Ricki Rodriguez, M.D. Attending Physician:  Trisha Mangle DD:  02/13/02 TD:  02/15/02 Job: [id number]

## 2011-01-11 NOTE — Discharge Summary (Signed)
[place]. [facility]  Patient:    [name], [name]                          MRN: [id number] Adm. Date:  [id number] Disc. Date: 02/03/01 Attending:  Erich Montane CC:         [facility] [address]   Discharge Summary  ADMISSION DIAGNOSES: 1. Episode of right facial numbness and weakness. 2. Atrial fibrillation. 3. Hypertension.  DISCHARGE DIAGNOSES: 1. Probable left brain transient ischemic attack event. 2. Hypertension. 3. Atrial fibrillation.  PROCEDURES DONE THIS ADMISSION: 1. MRI of the brain. 2. MRI angiogram. 3. A 2-D echocardiogram. 4. Carotid Doppler study.  COMPLICATIONS OF ABOVE PROCEDURES:  None.  HISTORY OF PRESENT ILLNESS:  [name] is a 77 year old white male born 1931-02-08 with a history of problems with hypertension and atrial fibrillation.  Patient has had atrial fibrillation for 25 years and has been on Coumadin followed through the [facility]. Hospital at times.  Patient has had a right-brain stroke in July 1997 and has been treated with Coumadin 5 mg a day with 7.5 mg for two days of the week.  Patient had an INR of 1.5 on this admission.  Patient was seen through the [facility] emergency room following an episode of right facial numbness and weakness that occurred.  Patient was noted to have the deficit in the morning of this admission.  Patient denied any syncope, chest pain, palpitations.  Patient was fully ambulatory at the time of presentation.  The deficit cleared during the hospitalization. Patient was admitted for an evaluation of TIA/stroke.  PAST MEDICAL HISTORY: 1. History of transient right facial numbness/weakness as above. 2. History of hypertension. 3. Atrial fibrillation. 4. History of strabismus in 1965. 5. History of appendectomy. 6. History of right-brain stroke in July 1997. 7. History of left-brain stroke in August 1997.  MEDICATIONS ON ADMISSION: 1. Hydrochlorothiazide 12.5 mg a  day. 2. Zestril 40 mg a day. 3. Multivitamins one a day. 4. Coumadin 5 mg a day and 7.5 mg on weekends.  ALLERGIES:  No known allergies.  HABITS:  Does not drink alcohol.  Smokes two packs of cigarettes a day.  Please refer to the History and Physical dictation summary for social history, family history, review of systems, and physical examination.  LABORATORY VALUES:  Notable for homocysteine level that is normal at 11.22. White count 8.4, hemoglobin 13.6, MCV 88.4, hematocrit 40.0, platelets 269. Coag on admission revealed protime 16.4, INR 1.5.  Sodium 136, potassium 4.7, chloride 98, CO2 27, glucose 99, BUN 19, creatinine 1.3, calcium 9.4, total protein 6.7, albumin 3.6, AST 22, ALT 17, alkaline phosphatase 94, total bili 0.9.  Cholesterol panel revealed cholesterol level 152, triglycerides 103, HDL 35, LDL 96.  Urinalysis revealed specific gravity 1.014, pH 6.5.  Chest x-ray revealed chronic lung disease, no active disease found.  EKG revealed atrial fibrillation, septal infarct age undetermined, heart rate 91.  HOSPITAL COURSE:  Patient has done well during the course of hospitalization. Patient was admitted with some transient right facial numbness and weakness. Patient was set up for an MRI scan of the brain with an MRI angiogram.  MRI of the brain shows atrophy and chronic small-vessel disease; no acute lesions were seen.  Chronic small-vessel changes were seen in the cerebellum and throughout the deep white matter of both cerebral hemispheres.  Old lacunar strokes are seen in the basal ganglia and  thalamic regions bilaterally.  MRI angiogram shows small-vessel intracranial and atherosclerotic changes; no severe stenosis was seen.  It was noted that the posterior cerebral arteries receive only a small amount of supply from the distal basal artery, patent posterior communicating arteries seen bilaterally.  A 2-D echocardiogram was performed and was suboptimal.  Ejection  fraction was 55-65%.  The study was inadequate to exclude the possibility of left ventricular thrombus.  No significant valvular disease was noted.  Trivial tricuspid valvular regurgitation was seen.  Carotid Doppler study was also performed and was unremarkable.  Antegrade vertebral artery flow was noted.  Patient has been placed on heparin and the Coumadin dosing was readjusted.  At the time of discharge his INR was 2.0.  DISPOSITION:  Patient will be discharged to home with follow-up with [facility] in three weeks.  Protime will be drawn on February 06, 2001.  DIET:  Patient is to be on a no added salt diet.  DISCHARGE MEDICATIONS: 1. Hydrochlorothiazide 12.5 mg a day. 2. Zestril 40 mg a day. 3. Multivitamins one a day. 4. Coumadin 6 mg a day.  CONDITION ON DISCHARGE:  At the time of discharge, patient is bright, alert, cooperative, fully ambulatory.  No visual or motor deficits noted.  Speech is normal. DD:  02/03/01 TD:  02/03/01 Job: [id number]

## 2011-01-11 NOTE — Discharge Summary (Signed)
[name], [name]                 ACCOUNT NO.:  [id number]   MEDICAL RECORD NO.:  [id number]          PATIENT TYPE:  INP   LOCATION:  1603                         FACILITY:  [facility]   PHYSICIAN:  [name], M.D.DATE OF BIRTH:  Jun 08, 1931   DATE OF ADMISSION:  09/28/2005  DATE OF DISCHARGE:  09/30/2005                                 DISCHARGE SUMMARY   PRIMARY CARE PHYSICIAN:  Unassigned here.  Dr. [name] at [facility].   ALLERGIES:  None.   CODE STATUS:  Full.   FINAL DIAGNOSES:  1.  Left lower lobe pneumonia.  2.  Chronic obstructive pulmonary disease.  3.  History of spontaneous pneumothorax.  4.  Coronary artery disease.  5.  Hypotension.  6.  Chronic atrial fibrillation.  7.  Hyperlipidemia.  8.  Cerebrovascular accident x2.  9.  Tobacco abuse.   HISTORY:  The patient was diagnosed with pneumonia.  Had an O2 saturation of  87%.  Short of breath.  Cough productive of yellowish sputum, fever, chills.  No edema.   Physical examination on admission was notable for a temperature 98.5,  although it later went up to 100.1, respiratory rate 28, irregularly  irregular heart rate, left lower lobe crackles, white count 15.2, and chest  x-ray showing left lower lobe consolidation.   HOSPITAL COURSE:  The patient was put on Rocephin and then azithromycin.  Sputum cultures did not grown anything out.  He required O2 initially but at  discharge, his room air O2 sat was 94%.  His INR had gone up to 4.3, so  Coumadin was held and then decreased.  He was continued on his other  medications.   DISPOSITION:  Patient will be discharged on Ceftin 500 b.i.d. for 7 days,  Zithromax 500 daily x4 days, and on his usual medications of lisinopril 10  daily, hydrochlorothiazide 12.5 daily, Cardizem CD 120 daily, Lipitor 10  daily.  We will add on Combivent 2 puffs q.i.d. for 2-3 weeks.  His usual  dose of Coumadin was 6 mg daily, but he is going  to take a 1/2 pill daily.  He is  going to get an INR on February 7 and  February 9 and have subsequent adjustments per Dr. Andi Devon.  He should have  a repeat chest x-ray in 2-3 weeks.  He should take vitamins, get extra rest,  drink hot liquids.      [name], M.D.  Electronically Signed     JLB/MEDQ  D:  09/30/2005  T:  09/30/2005  Job:  [id number]   cc:   [name], M.D.  Fax: [phone]

## 2011-01-11 NOTE — H&P (Signed)
NAMEZAQUAN, DUFFNER                           ACCOUNT NO.:  [id number]   MEDICAL RECORD NO.:  [id number]                   PATIENT TYPE:  INP   LOCATION:  X006                                 FACILITY:  [facility]   PHYSICIAN:  Mark C. Vernie Ammons, M.D.               DATE OF BIRTH:  [birth year]   DATE OF ADMISSION:  06/07/2002  DATE OF DISCHARGE:                                HISTORY & PHYSICAL   HISTORY OF PRESENT ILLNESS:  The patient is a 77 year old white male patient  who was seen for an elevated PSA.  His PSA was 9.6 back in 9/99, and at that  time he underwent a biopsy of his prostate that revealed a 54 g gland, but  no evidence of carcinoma.  PSA remained stable, but elevated until it began  to show a significant rise, and in 4/03, was noted to be 22.  I had  recommended to him back in 5/02, when his PSA had elevated to 15.07, to  undergo repeat biopsy.  At that time, he refused a repeat biopsy, but when  he was re-evaluated in 4/03, he was found to have a slightly asymmetric  prostate, the right side being larger then the left, with no obvious  nodularity.  He therefore underwent transrectal ultrasound which did not  reveal any hypoechoic lesions and a 60 g prostate that upon biopsy revealed  adenocarcinoma, Gleason score of 7 in 10% of the specimen from the right  side.  The CT scan and bone scan were performed which were negative for  metastatic disease, and we discussed all the treatment options.  The patient  has elected to proceed with radical prostatectomy, and in preparation  underwent a stress test which was suggestive of ischemic disease.  Cardiac  catheterization revealed occlusive coronary artery disease, and he underwent  coronary artery bypass grafting.  He now fully recovered from that, and he  is presenting for his radical prostatectomy.   PAST MEDICAL HISTORY:  1. No history of myocardial infarction.  History of coronary occlusive     disease, for which he  underwent four vessel coronary artery bypass     grafting in 6/03.  2. Atrial fibrillation.  3. Chronic obstructive pulmonary disease.  4. Hypercholesterolemia.   PAST SURGICAL HISTORY:  1. Appendectomy in 1968.  2. Right eye surgery in 1960.  3. Coronary artery bypass grafting as above.   ADMISSION MEDICATIONS:  1. Cartia XT 120 mg q.d.  2. Lisinopril 40 mg q.d.  3. Lipitor 10 mg q.d.  4. Multivitamin.  5. Coumadin 5 mg q.d.   ALLERGIES:  No known drug allergies.   SOCIAL HISTORY:  He denies chemical, drug, or alcohol use.  He has a greater  than 100 pack year smoking history.  He is deceased to 1/2 pack a day at  this time.   FAMILY HISTORY:  Negative for GU malignancy  or renal disease.   REVIEW OF SYMPTOMS:  Positive for a history of positive PPD.  Four to five  episodes of transient ischemic attacks in the past.  No GI complaints.  His  review of systems is otherwise per health history in the chart which was  reviewed and signed.   PHYSICAL EXAMINATION:  VITAL SIGNS:  Blood pressure is 118/64, pulse 96,  respirations 20, temperature is 97.9.  GENERAL:  The patient is a well-developed, well-nourished white male in no  acute distress.  HEENT:  Normocephalic, atraumatic.  Oropharynx is clear.  NECK:  Supple without JVD or adenopathy.  CARDIOVASCULAR:  Irregularly irregular.  LUNGS:  He has a healed median sternotomy scar.  ABDOMEN:  Soft and nontender without mass or hepatosplenomegaly.  No CVAT.  GENITOURINARY:  Normal male phallus with a normal glans and meatus.  Scrotum  is normal, testicles are descended bilaterally.  There is a varicocele noted  on the clefthand size grade 4, no inguinal hernias or adenopathy, normal  anus and perineum, normal rectal tone.  RECTAL:  His prostate is 1 to 2+ enlarged.  Smooth, flatly asymmetric.  Right side slightly larger then the left.  No nodularity is noted or  fixation.  No seminal vesicle abnormality is present.  EXTREMITIES:   Without cyanosis, clubbing, or edema.  SKIN:  Warm and dry.  NEUROLOGIC:  He is alert and oriented with appropriate mood and affect with  no gross focal neurologic deficits.   LABORATORY DATA:  PT done this morning was 16, INR of 1.3.  PTT was 41 on  06/01/02.  Electrolytes were all within normal limits.  Liver function tests  were normal.  Creatinine 11.2.  White count 9.3.  Hemoglobin and hematocrit  14.1 and 43.2.  Platelet count 366,000.  Urinalysis was clear.   Chest x-ray reveals small left pleural effusion and bullous emphysema,  otherwise unremarkable.   EKG reveals atrial fibrillation, but he was just seen by his cardiologist,  Dr. Algie Coffer on 05/24/02, and was felt medically optimized for surgery from a  cardiac standpoint.   IMPRESSION:  This patient with a PSA of 22, had a positive biopsy from the  right lobe of his prostate revealing Gleason 7 adenocarcinoma.  He has  elected to undergo radical retropubic prostatectomy, and has been made aware  of the risks and complications, including the probability based on Parten  tables of probability of an organ confined disease being 37%, extra-  prostatic extension 43%, seminal vesicle positivity of 12%, lymph node  positivity of 8%.  We have also discussed the other risks and complications  associated with surgery which are outlined for me in the office notes that  have been placed in the chart.   PLAN:  1. Radical retropubic prostatectomy, bilateral pelvic lymph node dissection.  2. Perioperative antibiotics.  3. Deep venous thrombosis prophylaxis with PAS and TED hose, as well as mini     dose of subcutaneous heparin.                                               Mark C. Vernie Ammons, M.D.    MCO/MEDQ  D:  06/07/2002  T:  06/07/2002  Job:  [id number]

## 2011-01-11 NOTE — H&P (Signed)
NAMEJAEVON, [name] NO.:  [id number]   MEDICAL RECORD NO.:  [id number]           PATIENT TYPE:   LOCATION:                               FACILITY:  [facility]   PHYSICIAN:  Lonia Blood, M.D.       DATE OF BIRTH:  1931-07-02   DATE OF ADMISSION:  06/26/2005  DATE OF DISCHARGE:                                HISTORY & PHYSICAL   PRIMARY CARE PHYSICIAN:  [employer] [employer].   CHIEF COMPLAINT:  Shortness of breath.   HISTORY OF PRESENT ILLNESS:  [name] is a 77 year old man with a history  of coronary artery disease, tobacco abuse, who presented to [facility], on  June 26, 2005, for a two weeks of increasing shortness of breath and  coughing.  The patient has been having trouble breathing for the past two  weeks but no chest pain but enough for him to consider coming to the  doctor's attention.  Today here in the emergency room, [name] is  complaining of significant shortness of breath with dry cough but no chest  pain, even when he takes a deep breath.   PAST MEDICAL HISTORY:  1.  Coronary artery disease, status post coronary artery bypass grafting in      2003.  2.  Atrial fibrillation on chronic Coumadin.  3.  Hypertension.  4.  Prostate cancer, status post radical prostatectomy in 2003.  5.  History of multiple minor strokes in 1998 and 2002.  6.  Status post appendectomy in 1969.  7.  Hyperlipidemia.   HOME MEDICATIONS:  1.  Coumadin at 6 mg daily.  2.  Lisinopril 10 mg daily.  3.  Lipitor 10 mg daily.  4.  Hydrochlorothiazide 12.5 mg daily.  5.  Diltiazem at 120 mg daily.  6.  Multivitamin by mouth daily.   SOCIAL HISTORY:  [name] is currently retired.  He is married.  He smokes  like a pack of cigarettes per day and he has been doing so for more than 50  years.  He does not drink any alcohol.  He does not use any drugs.  He has a  son that is healthy.   FAMILY HISTORY:  His mother deceased at age 53 with coronary  artery disease.  Father deceased at age 69.  He has got a brother that is deceased and had  diabetes.  Family history is also positive for colon cancer in the father.   REVIEW OF SYSTEMS:  Currently [name] denies any headache, blurry vision,  focal weakness or numbness.  He is wearing a hearing aid.  He is also  wearing glasses for vision.  He denies any sore throat.  No abdominal pain.  No diarrhea or constipation.  No melena or hematochezia or hematemesis.  He  denies any dysuria or flank pain.  All other systems as per HPI and the  others are negative.   PHYSICAL EXAMINATION:  VITAL SIGNS:  Upon admission show a temperature of  98.2, blood pressure 119/55, pulse of 92, respirations 26, saturation 90%  on  2 liters of oxygen.  GENERAL APPEARANCE:  [name] is a thin, frail.  He is sitting in bed with  accessory muscle use.  He does not appear in any acute distress at this  point.  EYES:  Pupils are equal, round, reactive to light and accommodation.  Extraocular movements intact.  Conjunctivae are pink.  MOUTH:  Has very dry tongue.  Throat is clear without any ulcerations or  exudate.  NECK:  Supple without JVD.  No carotid bruits.  RESPIRATORY:  He has a barrel chest.  He has prolonged expiratory phase,  wheezes throughout, rhonchi.  No crackles.  He has got hyper __________ to  percussion more so on the right than on the left.  CARDIAC:  Shows an irregularly irregular heart rate without murmurs, rubs,  or gallops.  ABDOMEN:  Soft, nontender.  He has got some post surgical scars.  There is  no hepatosplenomegaly.  He has got a tiny ventral hernia that contains fat  and no bowel.  He has no guarding and no rebound on palpation.  GENITOURINARY:  He has got no CVA tenderness.  SKIN:  Warm and dry.  EXTREMITIES:  He has got some clubbing.  His lower extremities have no  edema.  He has full range of motion all four extremities.  NEUROLOGIC:  Cranial nerves III-XII are intact.  He  has got some memory  deficits, but he is able to interact and answer all the questions.  His  speech is unremarkable.  He has got 5/5 strength in all four extremities and  sensation is intact.  LYMPHATIC:  He has got no palpable lymphadenopathy.   LABORATORY VALUES:  At the time of admission, a portable chest x-ray shows  COPD findings, question right lower lobe infiltrate, a 15% loculated right  pneumothorax.  EKG shows atrial fibrillation with a left bundle branch  block.  Sodium is 135, potassium 4.1, chloride 104, bicarb 25, BUN 18,  creatinine 1.3, glucose 120.  White blood cell count is 17,000, hemoglobin  14.5, platelet count 343, absolute neutrophil count 11.8.  D-dimmer 1005. CK  155, CK-MB 4.3, troponin I of 0.05.   ASSESSMENT/PLAN:  1.  Chronic obstructive pulmonary disease exacerbation.  We will obtain an      ABG, further quantify respiratory status and will start this patient on      intravenous Solu-Medrol 125 mg every eight hours, and also on nebulizers      every four hours, and antibiotics using Avelox.  We will monitor him      closely in the stepdown unit and keep him on oxygen per nasal cannula to      keep his saturations above 90%.  2.  Pneumothorax.  Dr. [name] from [facility] is going to evaluate the patient and      consider chest tube placement.  3.  Coronary artery disease.  We will obtain three sets of cardiac enzymes,      keep this patient on telemetry, and restart the Lipitor and start him on      aspirin 81 mg daily.  4.  Atrial fibrillation.  We will keep the patient on Cardizem for rate      control, Coumadin for anticoagulation when safe to do so according to      the [facility].  5.  Hyperlipidemia.  The patient had a recent fasting lipid panel done at      [facility] and we will continue him on Lipitor 10 mg  at bedtime.  6.  Gastrointestinal prophylaxis will be done using Protonix.      Lonia Blood, M.D.  Electronically Signed    SL/MEDQ  D:   06/26/2005  T:  06/26/2005  Job:  [id number]

## 2011-01-12 ENCOUNTER — Inpatient Hospital Stay (HOSPITAL_COMMUNITY): Payer: Medicare Other

## 2011-01-12 LAB — CBC
Hemoglobin: 11.9 g/dL — ABNORMAL LOW (ref 13.0–17.0)
MCH: 28.7 pg (ref 26.0–34.0)
RBC: 4.15 MIL/uL — ABNORMAL LOW (ref 4.22–5.81)
WBC: 11.2 10*3/uL — ABNORMAL HIGH (ref 4.0–10.5)

## 2011-01-12 LAB — BASIC METABOLIC PANEL
Calcium: 9.2 mg/dL (ref 8.4–10.5)
Chloride: 97 mEq/L (ref 96–112)
Creatinine, Ser: 1.03 mg/dL (ref 0.4–1.5)
GFR calc Af Amer: >60 mL/min (ref >60)
GFR calc non Af Amer: >60 mL/min (ref >60)

## 2011-01-12 LAB — PROTIME-INR
INR: 1.6 — ABNORMAL HIGH (ref 0.00–1.49)
Prothrombin Time: 19.2 seconds — ABNORMAL HIGH (ref 11.6–15.2)

## 2011-01-12 LAB — APTT: aPTT: 100 seconds — ABNORMAL HIGH (ref 24–37)

## 2011-01-12 LAB — HEPARIN LEVEL (UNFRACTIONATED): Heparin Unfractionated: 0.28 IU/mL — ABNORMAL LOW (ref 0.30–0.70)

## 2011-01-12 LAB — MAGNESIUM: Magnesium: 2 mg/dL (ref 1.5–2.5)

## 2011-01-13 ENCOUNTER — Inpatient Hospital Stay (HOSPITAL_COMMUNITY): Payer: Medicare Other

## 2011-01-13 LAB — CBC
MCHC: 32.7 g/dL (ref 30.0–36.0)
Platelets: 228 10*3/uL (ref 150–400)
RDW: 13.8 % (ref 11.5–15.5)
WBC: 10.9 10*3/uL — ABNORMAL HIGH (ref 4.0–10.5)

## 2011-01-13 LAB — HEPARIN LEVEL (UNFRACTIONATED): Heparin Unfractionated: 0.28 IU/mL — ABNORMAL LOW (ref 0.30–0.70)

## 2011-01-13 LAB — APTT: aPTT: 103 seconds — ABNORMAL HIGH (ref 24–37)

## 2011-01-14 ENCOUNTER — Inpatient Hospital Stay (HOSPITAL_COMMUNITY): Payer: Medicare Other

## 2011-01-14 LAB — COMPREHENSIVE METABOLIC PANEL
ALT: 19 U/L (ref 0–53)
AST: 24 U/L (ref 0–37)
Alkaline Phosphatase: 109 U/L (ref 39–117)
CO2: 32 mEq/L (ref 19–32)
Chloride: 99 mEq/L (ref 96–112)
GFR calc Af Amer: >60 mL/min (ref >60)
GFR calc non Af Amer: >60 mL/min (ref >60)
Sodium: 138 mEq/L (ref 135–145)
Total Bilirubin: 0.4 mg/dL (ref 0.3–1.2)

## 2011-01-14 LAB — CBC
Hemoglobin: 11.7 g/dL — ABNORMAL LOW (ref 13.0–17.0)
MCH: 28.5 pg (ref 26.0–34.0)
MCV: 87.3 fL (ref 78.0–100.0)
Platelets: 236 10*3/uL (ref 150–400)
RBC: 4.11 MIL/uL — ABNORMAL LOW (ref 4.22–5.81)

## 2011-01-14 NOTE — H&P (Signed)
NAMEGIVANNI, [name] NO.:  [id number]  MEDICAL RECORD NO.:  [id number]           PATIENT TYPE:  I  LOCATION:  3308                         FACILITY:  [facility]  PHYSICIAN:  Felipa Evener, MD  DATE OF BIRTH:  [birth year]  DATE OF ADMISSION:  01/06/2011 DATE OF DISCHARGE:                             HISTORY & PHYSICAL   IDENTIFICATION:  The patient is a 77 year old male with past medical history significant for COPD, chronic biventricular heart failure, atrial fibrillation, and continued tobacco abuse who presents to the hospital with the chief complaint of shortness of breath that started suddenly in the morning of presentation to the hospital on Jan 06, 2011, while the patient was outside getting lunch, but before eating with no evidence of aspiration.  The patient reported shortness of breath accompanied by sudden stabbing chest pain in the right chest, but it resolved quickly but the shortness of breath persists and the patient was brought to the hospital by his wife.  He denies any fever, chills, nausea, vomiting, abdominal pain, or chest pain at this time.  No cough or increase in sputum production.  The patient has a history of severe COPD that is nonoxygen dependent and he has no pulmonologist.  He is currently still actively smoking.  The only physician the patient sees on a regular basis Dr. Algie Coffer for his cardiac disease.  PAST MEDICAL HISTORY:  Significant for hypertension for more than 10 years, history of smoking for 65 years of approximately 1-2 packs per day, having up to approximately 100-pack years, history of MI, history of CVA x2 as well as several episodes of TIA, history of atrial fibrillation, history of coronary artery disease, requiring a CABG in 2003.  PAST SURGICAL HISTORY:  CABG in 2003, prostate surgery for prostate cancer "a few years ago," bilateral eye surgery for gaze problem many years ago, and appendectomy at age  78.  FAMILY HISTORY:  Mother died for multiple sclerosis at age 56.  Father died for MI at age 19, had one brother who died of renal failure, 2 sisters, one living and one died of heart disease.  SOCIAL HISTORY:  He has approximately 80-100-pack-year history of smoking.  No alcohol, no drug use.  REVIEW OF SYSTEMS:  A 12-point review of systems is negative other than mentioned above.  MEDICATIONS:  From his last discharge summary.  The patient does not recall his medications. 1. Lasix 40 mg p.o. daily. 2. Nitroglycerin 0.4 mg tablets sublingual q.5 minutes x3 as needed     for chest pain. 3. Toprol-XL 25 mg daily. 4. Xopenex inhaler as needed. 5. Coumadin 7.5 mg daily. 6. Lisinopril 5 mg daily. 7. Calcium with vitamin D over-the-counter 2 tablets daily. 8. Lipitor 20 mg daily. 9. Multivitamin 1 tablet daily.  ALLERGIES:  No known drug allergies.  PHYSICAL EXAMINATION:  GENERAL:  This is an elderly-appearing male in visible respiratory distress. VITAL SIGNS:  He has a temperature 98.2 rather, heart rate of 70 and 100% paced, blood pressure 130/70 with saturation 96% 2 L nasal cannula, respiratory rate is 18. HEENT:  Normocephalic and atraumatic.  Pupils are equal, round, and reactive to light.  Extraocular movements are intact.  Oral and nasal mucosa within normal limits. NECK:  No thyromegaly.  No lymphadenopathy.  Normal jugular reflux appreciated. HEART:  Regular rate and rhythm.  S1 and S2.  No murmurs, rubs, or gallops appreciated. LUNGS:  Diffuse end-expiratory wheezes audible in all lung fields, prolonged expiratory phase and diffuse breath sounds on the right compared to left. ABDOMEN:  Soft, nontender, nondistended.  Positive bowel sounds. EXTREMITIES:  No edema.  No tenderness appreciated. NEUROLOGIC:  Grossly intact.  LABORATORY STUDIES:  All reviewed, significant for chest x-ray reviewed myself that showed right-sided lateral pneumothorax.  BMP:  Sodium  135, potassium 4.5, chloride 96, CO2 of 30, glucose of 151, BUN is 25, creatinine is 0.96, calcium of 9.9.  His first set of cardiac enzymes was negative and INR of 2.20.  CBC:  White blood cell count 9.9, hemoglobin 14.8, hematocrit 44.9, and platelet count of 246.  ASSESSMENT AND PLAN:  The patient is a 77 year old male with past medical history significant for severe chronic obstructive pulmonary disease as well as coronary artery disease and atrial fibrillation on Coumadin with an INR of 2.2, who presents with a spontaneous __________ with exertion outside the hospital.  The patient's INR is 2.2 at this time.  Therefore, we will give the patient 2 units of FFP. Unfortunately, __________ as the patient is profoundly or severely compromised from respiratory standpoint and relieving the pneumothorax will help avoid intubation especially after speaking with the patient. The patient wishes to be a full no code blue.  I spoke with the patient with regards to his pneumothorax and risk of bleeding associated with use of Coumadin with such a procedure.  He was agreeable to the matter and agreeable for FFP transfusion prior to placement of chest tube. Therefore with regards to the pneumothorax, we will give the patient 2 units of __________ placed the chest tube after the first unit of FFP. We will continue second one and we will start the heparin at 10 p.m. tonight that will be approximately 5-6 hours of the patient  after chest tube was placed.  We will also ask the pharmacy to dose heparin for [place] at that point and he is on IV drip.  Coumadin will be started tomorrow per pharmacy with the daily PT and INR.  However, with regards to his atrial fibrillation, Coumadin was restarted as mentioned above.  Toprol- XL 25 mg p.o. daily starting tomorrow with Lasix again will be given 40 mg IV x1, given __________ FFP and patient with history of congestive heart failure, then we will give Lasix 40 mg  p.o. daily after that. With regards to his hypertriglyceridemia, Lipitor 20 mg p.o. daily, will be ordered.  With regards to his coronary artery disease, nitroglycerin 0.4 mg tablets will be ordered as above.  With regards to his COPD, we will order __________ desaturation study, although I doubt that the patient is hypoxic on ambulation.  We will do that after the patient's lungs are reinflated and that will be on Jan 08, 2011, that has to be ordered within the pulmonary followup.  DuoNebs will be ordered as well as __________ protocol and will replace the chest tube once FFP __________. Upon discussing the course of the patient, the patient informed that he would like to be full no code blue.  Full no code blue sheet will be filled out upon request of the patient and seemed to be and perfectly  normal mental state when making such a request.     Felipa Evener, MD     WJY/MEDQ  D:  01/06/2011  T:  01/07/2011  Job:  [id number]  Electronically Signed by Koren Bound MD on 01/14/2011 78:29:56 PM

## 2011-01-14 NOTE — Consult Note (Signed)
  [name], [name]                 ACCOUNT NO.:  [id number]  MEDICAL RECORD NO.:  [id number]           PATIENT TYPE:  I  LOCATION:  3308                         FACILITY:  [facility]  PHYSICIAN:  Ines Bloomer, M.D. DATE OF BIRTH:  01/17/1931  DATE OF CONSULTATION: DATE OF DISCHARGE:                                CONSULTATION   CHIEF COMPLAINT:  Pneumothorax.  HISTORY OF PRESENT ILLNESS:  This 77 year old patient was admitted by pulmonary with a right pneumothorax and he had about 40% pneumothorax after having chest pain.  He has a history of ongoing tobacco abuse, chronic obstructive pulmonary disease, atrial fibrillation, and congestive heart failure.  Also has been on Coumadin for his fibrillation.  A right pigtail chest tube was inserted after admission and is being seen by pulmonary who asked [place] to see him today.  MEDICATIONS IN THE HOSPITAL:  Ventolin, Atrovent, Coumadin, Toprol, Lasix, Crestor, Protonix, heparin, nitroglycerin, Tylenol.  ALLERGIES:  He has no allergies.  He had a CT with pacemaker.  FAMILY HISTORY:  Noncontributory.  SOCIAL HISTORY:  He continues to smoke.  He is married and does not drink alcohol on a regular basis.  REVIEW OF SYSTEMS:  CARDIAC:  See history of present illness. PULMONARY:  See history of present illness.  No hemoptysis.  GI:  No nausea, vomiting, constipation, diarrhea.  GU:  Has dysuria.  No kidney disease.  MUSCULOSKELETAL:  Arthritis.  ENT:  No changes in eyesight or hearing.  HEMATOLOGICAL:  No problems with bleeding.  He is on Coumadin. No history of PE.  VASCULAR:  No claudication, DVT, or TIAs.  PHYSICAL EXAMINATION:  GENERAL:  He is a well-developed Caucasian male in no acute distress. VITAL SIGNS:  His blood pressure is 140/80, pulse 70 being paced, sats were 92%. HEAD, EARS, NOSE, AND THROAT:  Unremarkable. CHEST:  There is a right chest tube and some bilateral wheezes. HEART:  Regular paced and rhythm. ABDOMEN:   Soft.  Pulses are 1+. EXTREMITIES:  No clubbing or edema. NEUROLOGICAL:  He is oriented x3.  IMPRESSION: 1. Pneumothorax secondary to chronic obstructive pulmonary disease. 2. Ongoing tobacco abuse. 3. Congestive heart failure with atrial fibrillation status post     pacemaker.  PLAN:  Management of chest tube.     Ines Bloomer, M.D.     DPB/MEDQ  D:  01/08/2011  T:  01/08/2011  Job:  [id number]  Electronically Signed by Jovita Gamma M.D. on 01/14/2011 02:18:58 PM

## 2011-01-15 ENCOUNTER — Inpatient Hospital Stay (HOSPITAL_COMMUNITY): Payer: Medicare Other

## 2011-01-15 LAB — HEPARIN LEVEL (UNFRACTIONATED): Heparin Unfractionated: 0.36 IU/mL (ref 0.30–0.70)

## 2011-01-15 LAB — CBC
MCHC: 32.8 g/dL (ref 30.0–36.0)
RDW: 13.7 % (ref 11.5–15.5)

## 2011-01-15 LAB — PROTIME-INR: Prothrombin Time: 26.8 seconds — ABNORMAL HIGH (ref 11.6–15.2)

## 2011-01-16 ENCOUNTER — Inpatient Hospital Stay (HOSPITAL_COMMUNITY): Payer: Medicare Other

## 2011-01-16 DIAGNOSIS — J930 Spontaneous tension pneumothorax: Secondary | ICD-10-CM

## 2011-01-16 LAB — CBC
Hemoglobin: 12.1 g/dL — ABNORMAL LOW (ref 13.0–17.0)
MCH: 28.5 pg (ref 26.0–34.0)
MCHC: 32.9 g/dL (ref 30.0–36.0)
Platelets: 284 10*3/uL (ref 150–400)

## 2011-01-16 LAB — BASIC METABOLIC PANEL
CO2: 32 mEq/L (ref 19–32)
Calcium: 9.7 mg/dL (ref 8.4–10.5)
Chloride: 100 mEq/L (ref 96–112)
Glucose, Bld: 90 mg/dL (ref 70–99)
Sodium: 139 mEq/L (ref 135–145)

## 2011-01-16 LAB — PROTIME-INR: Prothrombin Time: 27.1 seconds — ABNORMAL HIGH (ref 11.6–15.2)

## 2011-01-17 ENCOUNTER — Inpatient Hospital Stay (HOSPITAL_COMMUNITY): Payer: Medicare Other

## 2011-01-17 LAB — CBC
Hemoglobin: 12 g/dL — ABNORMAL LOW (ref 13.0–17.0)
MCH: 28.2 pg (ref 26.0–34.0)
MCHC: 32.3 g/dL (ref 30.0–36.0)
MCV: 87.3 fL (ref 78.0–100.0)

## 2011-01-17 LAB — PROTIME-INR: Prothrombin Time: 29.8 seconds — ABNORMAL HIGH (ref 11.6–15.2)

## 2011-01-22 ENCOUNTER — Other Ambulatory Visit: Payer: Self-pay | Admitting: Thoracic Surgery

## 2011-01-22 DIAGNOSIS — I2510 Atherosclerotic heart disease of native coronary artery without angina pectoris: Secondary | ICD-10-CM

## 2011-01-23 ENCOUNTER — Ambulatory Visit (INDEPENDENT_AMBULATORY_CARE_PROVIDER_SITE_OTHER): Payer: Medicare Other | Admitting: Thoracic Surgery

## 2011-01-23 ENCOUNTER — Ambulatory Visit
Admission: RE | Admit: 2011-01-23 | Discharge: 2011-01-23 | Disposition: A | Discharge status: Home / Self Care | Payer: Medicare Other | Source: Ambulatory Visit | Attending: Thoracic Surgery | Admitting: Thoracic Surgery

## 2011-01-23 DIAGNOSIS — I2510 Atherosclerotic heart disease of native coronary artery without angina pectoris: Secondary | ICD-10-CM

## 2011-01-23 DIAGNOSIS — J930 Spontaneous tension pneumothorax: Secondary | ICD-10-CM

## 2011-01-24 NOTE — Assessment & Plan Note (Signed)
OFFICE VISIT  SADLER, TESCHNER DOB:  [birth year]                                        Jan 23, 2011 CHART #:  [id number]  The patient returned today and his chest x-ray showed recurrence of his pneumothorax.  His incision is healed.  His lungs are clear.  He is feeling well at home on 2 L of oxygen.  His blood pressure was 117/63, pulse 76, respirations 18, sats were 98%.  I will refer him back to Dr. Delton Coombes and Dr. Johney Frame.  Ines Bloomer, M.D. Electronically Signed  DPB/MEDQ  D:  01/23/2011  T:  01/24/2011  Job:  [id number]  cc:   Leslye Peer, MD Hillis Range, MD

## 2011-01-28 NOTE — Discharge Summary (Addendum)
NAMESAYLOR, SHECKLER NO.:  [id number]  MEDICAL RECORD NO.:  [id number]           PATIENT TYPE:  I  LOCATION:  2004                         FACILITY:  [facility]  PHYSICIAN:  Leslye Peer, MD    DATE OF BIRTH:  07-08-1931  DATE OF ADMISSION:  01/06/2011 DATE OF DISCHARGE:  01/17/2011                              DISCHARGE SUMMARY   DISCHARGE DIAGNOSES: 1. 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 2. Right spontaneous pneumothorax secondary to chronic obstructive     pulmonary disease with severe bullous emphysema. 3. Atrial fibrillation. 4. History of hypertension. 5. History of coronary artery disease/myocardial infarction/coronary     artery bypass graft. 6. History of transient ischemic attacks.  LABORATORY DATA:  On May 24, CBC demonstrates WBC 9.1, hemoglobin 12.0, hematocrit 37.2, platelet count 308.  PT of 29.8, INR 2.83.  MICRO DATA:  MRSA PCR screening is negative.  RADIOLOGIC DATA: 1. On May 13, chest x-ray demonstrates right-sided pneumothorax     approximately 40%. 2. On May 24, two-view of the chest demonstrates removal of chest     tube.  No pneumothorax noted, significant scarring and bullous     changes in the right lung apex.  Changes consistent with COPD.  HISTORY OF PRESENT ILLNESS:  [name] is a 77 year old white male with a past medical history significant for COPD, chronic biventricular heart failure, atrial fibrillation and continued tobacco abuse who presented to [facility] on May 13 with complaints of shortness of breath.  This began suddenly while outside eating lunch.  He indicated that he had a sudden having chest pain in the right side, but it resolved weekly; however, the shortness of breath persisted and his wife transported him to the emergency room.  At time of presentation, he denied fevers, chills, nausea, vomiting, and abdominal pain.  He does have a known history of severe COPD, that  is not oxygen dependent prior to admission and he has not followed by Pulmonary.  He is followed by Dr. Algie Coffer for primary care and cardiac disease.  Chest x-ray evaluation in the emergency department at time of admission demonstrated a proximal 40% right-sided pneumothorax.  A pigtail catheter was inserted on the right per Dr. Edwyna Shell.  During hospitalization, [name] did have significant difficulties with reinflation of right lung.  Requiring prolonged chest tube placement.  He did have significant air leak with chest tube during hospital course.  He required intermittent suction/water seal of chest tube during hospital course.  He did progress to removal of chest tube on Jan 16, 2011.  Post removal, chest x-rays demonstrate no pneumothorax.  Repeat chest x-ray on day of discharge demonstrates no pneumothorax, improved airspace disease in the left lower lobe. Initially during hospitalization, [name] was placed on heparin drip in the setting of known atrial fibrillation with Coumadin administration prior to admission.  This was initiated out of concern for possible need for surgical intervention.  He required no surgeries during hospital course and was transitioned back to Coumadin prior to discharge.  [name] has a known history  of hypertension, coronary artery disease and TIAs.  He was continued on previous home regimen during hospital course except for lisinopril.  At time of discharge, he will be continued on previous Lopressor dosing, however, lisinopril was held during hospitalization and can be reviewed for resummation at time of follow up with Dr. Algie Coffer.  His discharge blood pressures on Lopressor were ranging the systolics of 100-130s and diastolics of 50s/60s.  [name] was evaluated for home O2 need prior to discharge, was ambulatory, desaturations to 87%.  He will continue at time of discharge on 2 liters per nasal cannula continuously.  HOSPITAL COURSE BY  DISCHARGE DIAGNOSIS: 1. COPD with severe bullous emphysema.  As per HPI, [name] was     admitted on May 13 with an acute spontaneous pneumothorax on the     right side.  He did require placement of pigtail catheter per Dr.     Edwyna Shell on admission.  He did have difficulties with reinflation of     the right lung during hospitalization.  He was on intermittent     suction and water seal of the chest tube during the hospital     course.  The pigtail catheter was removed on the 23rd with     resolution of pneumothorax. 2. Right spontaneous pneumothorax secondary to #1.  Please see above     for details.  Chest x-ray at time of discharge does not demonstrate     a pneumothorax.  His wife has been instructed to keep his chest     tube site clean and dry, may be cleaned with soap and water and     covered with a dry dressing.  Evidence of infection was discussed     with family at time of discharge to include redness, warmth and     drainage from the chest tube site.  They indicate verbal     understanding of symptoms and to call if any concerns. 3. Atrial fibrillation.  [name] does have a known history of AFib     prior to presentation on Coumadin therapy.  Out of concern for     possible surgical intervention during hospitalization, he was     placed on heparin drip.  Prior to discharge he was transitioned     back to Coumadin therapy as he did not require any surgical     intervention. 4. Hypertension.  Prior to presentation, [name] was on lisinopril     and metoprolol.  Lisinopril was held during hospitalization and     will continue to be held at time of discharge with consideration     for resummation of lisinopril at time of hospital followup per Dr.     Algie Coffer.  He will be discharged on Lopressor at previous home     dosing. 5. History of coronary artery disease/MI/CABG.  No acute events during     this hospitalization related to these diagnoses.  He will follow up     with  his primary care provider/cardiologist, Dr. Algie Coffer. 6. History of TIAs.  No acute changes or events during this     hospitalization.  DISCHARGE INSTRUCTIONS: 1. Activity.  [name] has been instructed to increase activity     slowly and as tolerated. 2. Diet.  Low sodium, heart healthy. 3. Followup.  He is scheduled to follow up with Dr. Algie Coffer on     Tuesday, May 29 at 10:00 a.m.  At the time of followup, he will  have a PT/INR for evaluation of Coumadin adjustments.  Dr. Edwyna Shell     will contact [name] for followup appointment.  He is scheduled     to follow up Dr. Delton Coombes for COPD management on June 13 at 4:15 p.m.  DISCHARGE MEDICATIONS: 1. Acetaminophen 650 mg by mouth every 6 hours as needed. 2. Coumadin, this is not a new medication for [name]; however, the     dosing has been changed until followup PT/INR with Dr. Algie Coffer.  He     has been instructed to take 5 mg daily until seen by Dr. Algie Coffer.     A script for 5 mg tablets of Coumadin was given to [name] as he     only had 7.5 mg tablets at home. 3. Calcium with vitamin D 2 tablets by mouth daily. 4. Lasix 40 mg by mouth daily. 5. Lipitor 20 mg by mouth daily. 6. Multivitamin by mouth daily. 7. Nitroglycerin 0.4 mg tabs sublingually every 5 minutes as needed up     to 3 doses. 8. Toprol-XL 1 tablet by mouth daily. 9. Xopenex inhaler 2 puffs inhaled every 4 hours as needed for     shortness of breath.  [name] has been instructed to stop taking the 7.5 mg dosing of Coumadin daily.  Please see above for dosing instructions.  DISPOSITION AT TIME OF DISCHARGE:  [name] has met maximum benefit of inpatient therapy and is currently medically stable and cleared for discharge, pending followup as above.     Canary Brim, NP   ______________________________ Leslye Peer, MD    BO/MEDQ  D:  01/17/2011  T:  01/18/2011  Job:  [id number]  cc:   Ines Bloomer, M.D. Leslye Peer, MD Ricki Rodriguez, M.D.  Electronically Signed by Canary Brim  on 01/28/2011 01:26:16 PM Electronically Signed by Levy Pupa MD on 02/06/2011 09:16:57 AM

## 2011-02-04 ENCOUNTER — Encounter: Payer: Self-pay | Admitting: Emergency Medicine

## 2011-02-06 ENCOUNTER — Encounter: Payer: Self-pay | Admitting: Emergency Medicine

## 2011-02-06 ENCOUNTER — Ambulatory Visit (INDEPENDENT_AMBULATORY_CARE_PROVIDER_SITE_OTHER)
Admission: RE | Admit: 2011-02-06 | Discharge: 2011-02-06 | Disposition: A | Discharge status: Home / Self Care | Payer: Medicare Other | Source: Ambulatory Visit | Attending: Emergency Medicine | Admitting: Emergency Medicine

## 2011-02-06 ENCOUNTER — Ambulatory Visit (INDEPENDENT_AMBULATORY_CARE_PROVIDER_SITE_OTHER): Payer: Medicare Other | Admitting: Emergency Medicine

## 2011-02-06 DIAGNOSIS — I639 Cerebral infarction, unspecified: Secondary | ICD-10-CM

## 2011-02-06 DIAGNOSIS — J9383 Other pneumothorax: Secondary | ICD-10-CM

## 2011-02-06 DIAGNOSIS — J449 Chronic obstructive pulmonary disease, unspecified: Secondary | ICD-10-CM

## 2011-02-06 DIAGNOSIS — I2510 Atherosclerotic heart disease of native coronary artery without angina pectoris: Secondary | ICD-10-CM | POA: Insufficient documentation

## 2011-02-06 DIAGNOSIS — J4489 Other specified chronic obstructive pulmonary disease: Secondary | ICD-10-CM

## 2011-02-06 DIAGNOSIS — I6350 Cerebral infarction due to unspecified occlusion or stenosis of unspecified cerebral artery: Secondary | ICD-10-CM

## 2011-02-06 DIAGNOSIS — G459 Transient cerebral ischemic attack, unspecified: Secondary | ICD-10-CM | POA: Insufficient documentation

## 2011-02-06 DIAGNOSIS — C61 Malignant neoplasm of prostate: Secondary | ICD-10-CM | POA: Insufficient documentation

## 2011-02-06 DIAGNOSIS — I4891 Unspecified atrial fibrillation: Secondary | ICD-10-CM | POA: Insufficient documentation

## 2011-02-06 DIAGNOSIS — J939 Pneumothorax, unspecified: Secondary | ICD-10-CM | POA: Insufficient documentation

## 2011-02-06 MED ORDER — ALBUTEROL SULFATE (2.5 MG/3ML) 0.083% IN NEBU: 2.5000 mg | INHALATION_SOLUTION | Freq: Four times a day (QID) | RESPIRATORY_TRACT | Status: DC | PRN

## 2011-02-06 NOTE — Progress Notes (Signed)
Subjective:    Patient ID: [name], male    DOB: 08-Sep-1930, 77 y.o.   MRN: [id number]  HPI 77 yo man with hx COPD followed by Dr Algie Coffer. Also with CAD, A Fib. He was admitted and treated for spontaneous R PTX. Discharged with resolution of PTX, but only on prn xopenex as his BD. He has remained SOB, not using the xopenex. Still not near prior baseline (before PTX).    Review of Systems As per HPI     Past Medical History  Diagnosis Date  . COPD (chronic obstructive pulmonary disease)   . Prostate cancer 2003  . Stroke 2000  . CAD (coronary artery disease)   . AF (atrial fibrillation)   . HTN (hypertension)   . Pneumothorax, right      Family History  Problem Relation Age of Onset  . Heart attack Father     died age 6  . Heart disease Mother   . Heart disease Sister   . Heart disease Brother   . Multiple sclerosis Mother     died age 47  . Kidney failure Brother      History   Social History  . Marital Status: Married    Spouse Name: N/A    Number of Children: N/A  . Years of Education: N/A   Occupational History  . retired    Social History Main Topics  . Smoking status: Current Everyday Smoker -- 2.0 packs/day for 60 years  . Smokeless tobacco: Not on file   Comment: pt down to 3-4 ciagrettes daily  . Alcohol Use: No  . Drug Use: No  . Sexually Active: Not on file   Other Topics Concern  . Not on file   Social History Narrative  . No narrative on file     No Known Allergies   Outpatient Prescriptions Prior to Visit  Medication Sig Dispense Refill  . atorvastatin (LIPITOR) 20 MG tablet Take 20 mg by mouth daily.        . calcium-vitamin D (OSCAL WITH D) 250-125 MG-UNIT per tablet Take 1 tablet by mouth daily. 2 tablets daily       . furosemide (LASIX) 40 MG tablet Take 40 mg by mouth daily.        Marland Kitchen lisinopril (PRINIVIL,ZESTRIL) 5 MG tablet Take 5 mg by mouth daily. 1/2 daily       . metoprolol succinate (TOPROL-XL) 25 MG 24 hr tablet Take 25  mg by mouth daily.        . Multiple Vitamin (MULTIVITAMIN) tablet Take 1 tablet by mouth daily.        Marland Kitchen levalbuterol (XOPENEX HFA) 45 MCG/ACT inhaler Inhale 1-2 puffs into the lungs 4 (four) times daily as needed.        . nitroGLYCERIN (NITROSTAT) 0.4 MG SL tablet Place 0.4 mg under the tongue every 5 (five) minutes as needed. As directed       . warfarin (COUMADIN) 7.5 MG tablet Take 7.5 mg by mouth daily. As directed           Objective:   Physical Exam Gen: Elderly, thin, in no distress,  normal affect  ENT: No lesions,  mouth clear,  oropharynx clear, no postnasal drip, hoarse voice  Neck: No JVD, no TMG, no carotid bruits, no stridor  Lungs: No use of accessory muscles, no dullness to percussion, clear without rales or rhonchi  Cardiovascular: RRR, heart sounds normal, no murmur or gallops, no peripheral edema  Musculoskeletal: No  deformities, no cyanosis or clubbing  Neuro: alert, non focal  Skin: Warm, no lesions or rashes       Assessment & Plan:  COPD (chronic obstructive pulmonary disease) Trial scheduled albuterol to see if he benefits; if so then consider longer acting meds O2 as ordered  Pneumothorax on right CXR today, will call with results

## 2011-02-06 NOTE — Assessment & Plan Note (Signed)
CXR today, will call with results

## 2011-02-06 NOTE — Assessment & Plan Note (Signed)
Trial scheduled albuterol to see if he benefits; if so then consider longer acting meds O2 as ordered

## 2011-02-06 NOTE — Patient Instructions (Signed)
CXR today. You will be called with the results Start using albuterol via nebulizer machine twice a day  Follow up with Dr Delton Coombes in 1 month

## 2011-02-08 ENCOUNTER — Telehealth: Payer: Self-pay | Admitting: Emergency Medicine

## 2011-02-08 DIAGNOSIS — J939 Pneumothorax, unspecified: Secondary | ICD-10-CM

## 2011-02-08 NOTE — Telephone Encounter (Signed)
LMOMTCB x 1.  Pt should receive a call on Mon., 6/18 from [facility] once they have assigned pt a room. All of the pt's information has been given to bed control. They will also call me with the bed assignment. The hospital will page Dr. Delton Coombes on Mon., so he may write the orders.

## 2011-02-08 NOTE — Telephone Encounter (Signed)
Pt returned call

## 2011-02-08 NOTE — Telephone Encounter (Signed)
Spoke w patient and wife to discuss plans to address recurrent R PTX. I agree w Dr Edwyna Shell that least invasive plan will be hospital admission, chest tube placement, talc pleurodesis. The patient and his wife agree with this plan, want to proceed. I have asked him to hold his coumadin NOW, will plan for direct admission to [facility] next Monday 6/18 for tube insertion, probably by IR. We will make the admission arrangements. Pt's wife will reschedule existing appts with Drs Johney Frame and Algie Coffer.

## 2011-02-08 NOTE — Assessment & Plan Note (Signed)
Discussed case w Dr Edwyna Shell who knows him from previous hospitalization also. Least invasive strategy here will be R chest tube (? Pigtail) with talc pleurodesis. He will need to be admitted for this, will need coumadin held.

## 2011-02-08 NOTE — Telephone Encounter (Signed)
Pt's spouse aware [facility] will be calling on Monday morning, 02/11/2011, when a bed is available for the patient.

## 2011-02-09 ENCOUNTER — Inpatient Hospital Stay (HOSPITAL_COMMUNITY)
Admission: EM | Admit: 2011-02-09 | Discharge: 2011-02-15 | DRG: 200 | Disposition: A | Discharge status: Home Health | Payer: Medicare Other | Attending: Emergency Medicine | Admitting: Emergency Medicine

## 2011-02-09 ENCOUNTER — Emergency Department (HOSPITAL_COMMUNITY): Payer: Medicare Other

## 2011-02-09 DIAGNOSIS — F17200 Nicotine dependence, unspecified, uncomplicated: Secondary | ICD-10-CM | POA: Diagnosis present

## 2011-02-09 DIAGNOSIS — E785 Hyperlipidemia, unspecified: Secondary | ICD-10-CM | POA: Diagnosis present

## 2011-02-09 DIAGNOSIS — Z9981 Dependence on supplemental oxygen: Secondary | ICD-10-CM

## 2011-02-09 DIAGNOSIS — Z950 Presence of cardiac pacemaker: Secondary | ICD-10-CM

## 2011-02-09 DIAGNOSIS — Z8673 Personal history of transient ischemic attack (TIA), and cerebral infarction without residual deficits: Secondary | ICD-10-CM

## 2011-02-09 DIAGNOSIS — I2510 Atherosclerotic heart disease of native coronary artery without angina pectoris: Secondary | ICD-10-CM | POA: Diagnosis present

## 2011-02-09 DIAGNOSIS — I4891 Unspecified atrial fibrillation: Secondary | ICD-10-CM | POA: Diagnosis present

## 2011-02-09 DIAGNOSIS — Z79899 Other long term (current) drug therapy: Secondary | ICD-10-CM

## 2011-02-09 DIAGNOSIS — I2789 Other specified pulmonary heart diseases: Secondary | ICD-10-CM | POA: Diagnosis present

## 2011-02-09 DIAGNOSIS — J9383 Other pneumothorax: Principal | ICD-10-CM | POA: Diagnosis present

## 2011-02-09 DIAGNOSIS — I10 Essential (primary) hypertension: Secondary | ICD-10-CM | POA: Diagnosis present

## 2011-02-09 DIAGNOSIS — D72829 Elevated white blood cell count, unspecified: Secondary | ICD-10-CM | POA: Diagnosis not present

## 2011-02-09 DIAGNOSIS — I509 Heart failure, unspecified: Secondary | ICD-10-CM | POA: Diagnosis present

## 2011-02-09 DIAGNOSIS — J441 Chronic obstructive pulmonary disease with (acute) exacerbation: Secondary | ICD-10-CM | POA: Diagnosis present

## 2011-02-09 DIAGNOSIS — Z951 Presence of aortocoronary bypass graft: Secondary | ICD-10-CM

## 2011-02-09 LAB — APTT: aPTT: 32 seconds (ref 24–37)

## 2011-02-09 LAB — DIFFERENTIAL
Basophils Absolute: 0 10*3/uL (ref 0.0–0.1)
Lymphocytes Relative: 32 % (ref 12–46)
Lymphs Abs: 3.1 10*3/uL (ref 0.7–4.0)
Monocytes Absolute: 1.3 10*3/uL — ABNORMAL HIGH (ref 0.1–1.0)
Monocytes Relative: 14 % — ABNORMAL HIGH (ref 3–12)
Neutro Abs: 5 10*3/uL (ref 1.7–7.7)

## 2011-02-09 LAB — CBC
HCT: 38.7 % — ABNORMAL LOW (ref 39.0–52.0)
Hemoglobin: 12.4 g/dL — ABNORMAL LOW (ref 13.0–17.0)
MCHC: 32 g/dL (ref 30.0–36.0)
MCV: 90.2 fL (ref 78.0–100.0)

## 2011-02-09 LAB — POCT I-STAT, CHEM 8
Chloride: 101 mEq/L (ref 96–112)
Creatinine, Ser: 1.3 mg/dL (ref 0.50–1.35)
Glucose, Bld: 110 mg/dL — ABNORMAL HIGH (ref 70–99)
Hemoglobin: 13.6 g/dL (ref 13.0–17.0)
Potassium: 4.4 mEq/L (ref 3.5–5.1)

## 2011-02-10 ENCOUNTER — Inpatient Hospital Stay (HOSPITAL_COMMUNITY): Payer: Medicare Other

## 2011-02-10 DIAGNOSIS — R0902 Hypoxemia: Secondary | ICD-10-CM

## 2011-02-10 DIAGNOSIS — J449 Chronic obstructive pulmonary disease, unspecified: Secondary | ICD-10-CM

## 2011-02-10 DIAGNOSIS — J9383 Other pneumothorax: Secondary | ICD-10-CM

## 2011-02-10 LAB — POCT I-STAT 3, ART BLOOD GAS (G3+)
Bicarbonate: 32.6 mEq/L — ABNORMAL HIGH (ref 20.0–24.0)
pH, Arterial: 7.4 (ref 7.350–7.450)
pO2, Arterial: 81 mmHg (ref 80.0–100.0)

## 2011-02-10 LAB — BASIC METABOLIC PANEL
CO2: 31 mEq/L (ref 19–32)
Chloride: 101 mEq/L (ref 96–112)
Potassium: 3.8 mEq/L (ref 3.5–5.1)
Sodium: 141 mEq/L (ref 135–145)

## 2011-02-10 LAB — PHOSPHORUS: Phosphorus: 3.6 mg/dL (ref 2.3–4.6)

## 2011-02-10 LAB — CBC
Platelets: 184 10*3/uL (ref 150–400)
RBC: 4.3 MIL/uL (ref 4.22–5.81)
WBC: 8.3 10*3/uL (ref 4.0–10.5)

## 2011-02-10 LAB — GLUCOSE, CAPILLARY
Glucose-Capillary: 184 mg/dL — ABNORMAL HIGH (ref 70–99)
Glucose-Capillary: 88 mg/dL (ref 70–99)

## 2011-02-10 LAB — MAGNESIUM: Magnesium: 2 mg/dL (ref 1.5–2.5)

## 2011-02-11 ENCOUNTER — Inpatient Hospital Stay: Admission: RE | Admit: 2011-02-11 | Payer: Self-pay | Source: Ambulatory Visit | Admitting: Emergency Medicine

## 2011-02-11 ENCOUNTER — Encounter: Payer: Medicare Other | Admitting: Internal Medicine

## 2011-02-11 DIAGNOSIS — J438 Other emphysema: Secondary | ICD-10-CM

## 2011-02-11 DIAGNOSIS — J930 Spontaneous tension pneumothorax: Secondary | ICD-10-CM

## 2011-02-11 NOTE — Telephone Encounter (Signed)
Pt assigned to room 4708-01 at [facility].

## 2011-02-12 ENCOUNTER — Inpatient Hospital Stay (HOSPITAL_COMMUNITY): Payer: Medicare Other

## 2011-02-12 DIAGNOSIS — J449 Chronic obstructive pulmonary disease, unspecified: Secondary | ICD-10-CM

## 2011-02-13 ENCOUNTER — Inpatient Hospital Stay (HOSPITAL_COMMUNITY): Payer: Medicare Other

## 2011-02-14 ENCOUNTER — Inpatient Hospital Stay (HOSPITAL_COMMUNITY): Payer: Medicare Other

## 2011-02-14 DIAGNOSIS — J930 Spontaneous tension pneumothorax: Secondary | ICD-10-CM

## 2011-02-14 LAB — CBC
Hemoglobin: 12.5 g/dL — ABNORMAL LOW (ref 13.0–17.0)
MCHC: 31.7 g/dL (ref 30.0–36.0)
RBC: 4.39 MIL/uL (ref 4.22–5.81)
WBC: 21 10*3/uL — ABNORMAL HIGH (ref 4.0–10.5)

## 2011-02-15 ENCOUNTER — Inpatient Hospital Stay (HOSPITAL_COMMUNITY): Payer: Medicare Other

## 2011-02-15 DIAGNOSIS — J930 Spontaneous tension pneumothorax: Secondary | ICD-10-CM

## 2011-02-15 LAB — CBC
HCT: 35.7 % — ABNORMAL LOW (ref 39.0–52.0)
MCV: 88.4 fL (ref 78.0–100.0)
Platelets: 176 10*3/uL (ref 150–400)
RBC: 4.04 MIL/uL — ABNORMAL LOW (ref 4.22–5.81)
WBC: 14.8 10*3/uL — ABNORMAL HIGH (ref 4.0–10.5)

## 2011-02-15 LAB — BASIC METABOLIC PANEL
CO2: 30 mEq/L (ref 19–32)
Chloride: 97 mEq/L (ref 96–112)
Creatinine, Ser: 1.02 mg/dL (ref 0.50–1.35)

## 2011-02-15 LAB — MAGNESIUM: Magnesium: 2.3 mg/dL (ref 1.5–2.5)

## 2011-02-20 ENCOUNTER — Ambulatory Visit (INDEPENDENT_AMBULATORY_CARE_PROVIDER_SITE_OTHER)
Admission: RE | Admit: 2011-02-20 | Discharge: 2011-02-20 | Disposition: A | Discharge status: Home / Self Care | Payer: Medicare Other | Source: Ambulatory Visit | Attending: Adult Health | Admitting: Adult Health

## 2011-02-20 ENCOUNTER — Encounter: Payer: Self-pay | Admitting: Adult Health

## 2011-02-20 ENCOUNTER — Ambulatory Visit (INDEPENDENT_AMBULATORY_CARE_PROVIDER_SITE_OTHER): Payer: Medicare Other | Admitting: Adult Health

## 2011-02-20 VITALS — BP 140/70 | HR 74 | Temp 96.6°F | Ht 73.0 in | Wt 149.2 lb

## 2011-02-20 DIAGNOSIS — J449 Chronic obstructive pulmonary disease, unspecified: Secondary | ICD-10-CM

## 2011-02-20 DIAGNOSIS — J939 Pneumothorax, unspecified: Secondary | ICD-10-CM

## 2011-02-20 DIAGNOSIS — J9383 Other pneumothorax: Secondary | ICD-10-CM

## 2011-02-20 NOTE — Patient Instructions (Signed)
Your xray is improved with resolution of the pnuemothorax  Most important goal now is to stop smoking.  Begin Spiriva Handihaler 1 puff daily  follow up Dr Delton Coombes in 2 weeks and As needed   Set up for PFTS ~4 weeks  Please contact office for sooner follow up if symptoms do not improve or worsen or seek emergency care

## 2011-02-22 ENCOUNTER — Encounter: Payer: Medicare Other | Admitting: Internal Medicine

## 2011-02-26 NOTE — Assessment & Plan Note (Signed)
Begin Spiriva Handihaler 1 puff daily  follow up Dr Delton Coombes in 2 weeks and As needed   Set up for PFTS ~4 weeks  Please contact office for sooner follow up if symptoms do not improve or worsen or seek emergency care

## 2011-02-26 NOTE — Progress Notes (Signed)
  Subjective:    Patient ID: [name], male    DOB: 04-29-1931, 77 y.o.   MRN: [id number]  HPI 77 yo man with hx COPD, active smoker,  followed by Dr Algie Coffer. Also with CAD, A Fib. Seen by pulmonary for Pneumothorax 01/2011   02/[facility]/11 Post Hospital   He was admitted and treated for spontaneous R PTX. Discharged with resolution of PTX, but only on prn xopenex as his BD. He has remained SOB, not using the xopenex. Still not near prior baseline (before PTX).   02/21/11 Post Hospital  Readmitted for for recurrent right pnuemothorax. He required a chest tube. CXR prior to discharged with improving PTX.   Today chest xray shows resolved PTX.  Since discharge he feels he is better with less dyspnea and right sided pain.  He has not quit smoking we discussed smoking cesstation.     Review of Systems Constitutional:   No  weight loss, night sweats,  Fevers, chills, fatigue, or  lassitude.  HEENT:   No headaches,  Difficulty swallowing,  Tooth/dental problems, or  Sore throat,                No sneezing, itching, ear ache, nasal congestion, post nasal drip,   CV:  No chest pain,  Orthopnea, PND, swelling in lower extremities, anasarca, dizziness, palpitations, syncope.   GI  No heartburn, indigestion, abdominal pain, nausea, vomiting, diarrhea, change in bowel habits, loss of appetite, bloody stools.   Resp: No coughing up of blood.   No chest wall deformity  Skin: no rash or lesions.  GU: no dysuria, change in color of urine, no urgency or frequency.  No flank pain, no hematuria   MS:  No joint pain or swelling.  No decreased range of motion.  No back pain.  Psych:  No change in mood or affect. No depression or anxiety.  No memory loss.         Objective:   Physical Exam GEN: A/Ox3; pleasant , NAD, well nourished   HEENT:  [place]/AT,  EACs-clear, TMs-wnl, NOSE-clear, THROAT-clear, no lesions, no postnasal drip or exudate noted.   NECK:  Supple w/ fair ROM; no JVD; normal carotid  impulses w/o bruits; no thyromegaly or nodules palpated; no lymphadenopathy.  RESP  Coarse BS w/ diminshed bs in bases no accessory muscle use, no dullness to percussion Right CT site well approximated, no redness or drainage Sutures removed without difficulty, site cleansed and dressed.   CARD:  RRR, no m/r/g  , no peripheral edema, pulses intact, no cyanosis or clubbing.  GI:   Soft & nt; nml bowel sounds; no organomegaly or masses detected.  Musco: Warm bil, no deformities or joint swelling noted.   Neuro: alert, no focal deficits noted.    Skin: Warm, no lesions or rashes         Assessment & Plan:

## 2011-02-26 NOTE — Assessment & Plan Note (Signed)
Your xray is improved with resolution of the pnuemothorax  Most important goal now is to stop smoking.

## 2011-02-28 NOTE — Discharge Summary (Signed)
NAMEDELROY, [name] NO.:  [id number]  MEDICAL RECORD NO.:  [id number]  LOCATION:  4708                         FACILITY:  [facility]  PHYSICIAN:  Oretha Milch, MD      DATE OF BIRTH:  [birth year]  DATE OF ADMISSION:  02/09/2011 DATE OF DISCHARGE:  02/15/2011                              DISCHARGE SUMMARY   DISCHARGE DIAGNOSES: 1. Recurrent right spontaneous pneumothorax. 2. Chronic atrial fibrillation.  HISTORY OF PRESENT ILLNESS:  Mr. Heritage is a 77 year old white male with past medical history of COPD.  He is home O2 dependent and on a nasal cannula.  He has a history of congestive heart failure, atrial fibrillation, status post pacemaker placement, history of heavy tobacco abuse, and right spontaneous pneumothorax diagnosed in May 2012, status post chest tube placement.  He presented to [facility] on February 09, 2011, with recurrent pneumothorax.  He was admitted for further evaluation and treatment.  LABORATORY DATA:  Hemoglobin 11.7, hematocrit 35.7, platelets 176, WBC 14.8.  Sodium 141, potassium 3.8, chloride 101, CO2 is 31, BUN 22, creatinine 0.85, glucose is 101, calcium 9.2, magnesium 2.0, phosphorus 3.6.  RADIOGRAPHIC DATA:  His right pneumothorax is resolved with no pneumothorax on February 15, 2011, as demonstrated by chest x-ray.  HOSPITAL COURSE BY DISCHARGE DIAGNOSES: 1. Recurrent right spontaneous pneumothorax.  He was admitted to the     hospital and was evaluated by the cardiovascular thoracic surgeon.     He had a chest tube placed on the right and then had had inserted a     chest tube on February 13, 2011.  His chest tube was removed on February 15, 2011.  A chest x-ray did not demonstrate a recurrent     pneumothorax.  Therefore, he is to be discharged home to follow     with Rubye Oaks on February 20, 2011, at 10:30 a.m. with a chest x-     ray to evaluate for any chance of recurrent pneumothorax. 2. Chronic obstructive pulmonary  disease.  He remains on his O2 at 2     L.  He is also on albuterol nebulizers q.6 h. p.r.n. for breathing     activity. 3. Chronic atrial fibrillation.  He has been restarted back on his     Coumadin 5 mg daily.  At this time due to his low blood pressure,     his furosemide and Toprol have been held.  He is to follow up with     Dr. Algie Coffer within 5 days to evaluate for reinstitution of these     medications.  His diet is a heart-healthy low-sodium diet.  DISCHARGE MEDICATIONS: 1. Albuterol 2.5 nebulizer every 6 hours. 2. Calcium with vitamin D over the counter daily. 3. Coumadin 5 mg daily. 4. Multivitamins daily. 5. Nitroglycerin 0.4 mg sublingual as needed for chest pain.  He is to hold these medications: 1. Lipitor 20 mg daily. 2. Furosemide 40 mg daily. 3. Toprol 25 mg daily.  He has a followup appointment with Rubye Oaks on February 20, 2011, at 10:30 a.m.  He has been given instructions to keep the right  chest tube site clean, can apply Neosporin.  Not to take showers until the sutures have been removed.  The plan is for removal of sutures on his office followup.  DISPOSITION/CONDITION ON DISCHARGE:  Improved.  He is being discharged home in improved condition.  Note that this was a prolonged complicated discharge summary taking greater than 60 minutes.     Devra Dopp, MSN, ACNP   ______________________________ Oretha Milch, MD    SM/MEDQ  D:  02/15/2011  T:  02/16/2011  Job:  [id number]  cc:   Ricki Rodriguez, M.D.  Electronically Signed by Devra Dopp MSN ACNP on 02/19/2011 02:31:17 PM Electronically Signed by Cyril Mourning MD on 02/28/2011 02:33:44 AM

## 2011-03-05 ENCOUNTER — Encounter: Payer: Self-pay | Admitting: Emergency Medicine

## 2011-03-05 ENCOUNTER — Ambulatory Visit (INDEPENDENT_AMBULATORY_CARE_PROVIDER_SITE_OTHER): Payer: Medicare Other | Admitting: Emergency Medicine

## 2011-03-05 ENCOUNTER — Ambulatory Visit (INDEPENDENT_AMBULATORY_CARE_PROVIDER_SITE_OTHER)
Admission: RE | Admit: 2011-03-05 | Discharge: 2011-03-05 | Disposition: A | Discharge status: Home / Self Care | Payer: Medicare Other | Source: Ambulatory Visit | Attending: Emergency Medicine | Admitting: Emergency Medicine

## 2011-03-05 DIAGNOSIS — J4489 Other specified chronic obstructive pulmonary disease: Secondary | ICD-10-CM

## 2011-03-05 DIAGNOSIS — J939 Pneumothorax, unspecified: Secondary | ICD-10-CM

## 2011-03-05 DIAGNOSIS — J449 Chronic obstructive pulmonary disease, unspecified: Secondary | ICD-10-CM

## 2011-03-05 DIAGNOSIS — J9383 Other pneumothorax: Secondary | ICD-10-CM

## 2011-03-05 NOTE — Progress Notes (Signed)
  Subjective:    Patient ID: [name], male    DOB: 11-12-1930, 77 y.o.   MRN: [id number]  HPI 77 yo man with hx COPD, active smoker,  followed by Dr Algie Coffer. Also with CAD, A Fib. Seen by pulmonary for Pneumothorax 01/2011   02/06/11 Post Hospital   He was admitted and treated for spontaneous R PTX. Discharged with resolution of PTX, but only on prn xopenex as his BD. He has remained SOB, not using the xopenex. Still not near prior baseline (before PTX).   02/21/11 Post Hospital  Readmitted for for recurrent right pnuemothorax. He required a chest tube. CXR prior to discharged with improving PTX.   Today chest xray shows resolved PTX.  Since discharge he feels he is better with less dyspnea and right sided pain.  He has not quit smoking we discussed smoking cesstation.   ROV 03/05/11 - follows up for severe COPD and R PTX. He was readmitted as above for recurrence, now s/p another chest tube. Using albuterol prn about twice a day, xopenex prn. Just started Spiriva on 6/28 - he thinks it is helping. Continues to smoke 4 -5 a day, wants to try to stop. Was d/c to home on O2. CXR today with small residual R PTX, no change from 6/27 but more noticeable than 6/22 discharge. His breathing has not decompensated as in the past when he had recurrent ptx.      Objective:   Physical Exam GEN: A/Ox3; pleasant , NAD, thin  HEENT:  [place]/AT,  EACs-clear, TMs-wnl, NOSE-clear, THROAT-clear, no lesions, no postnasal drip or exudate noted.   NECK:  Supple w/ fair ROM; no JVD; normal carotid impulses w/o bruits; no thyromegaly or nodules palpated; no lymphadenopathy.  RESP  Coarse BS w/ diminshed bs in bases no accessory muscle use, no dullness to percussion, mild end exp wheeze  CARD:  RRR, no m/r/g  , no peripheral edema, pulses intact, no cyanosis or clubbing.  Musco: Warm bil, no deformities or joint swelling noted.   Neuro: alert, no focal deficits noted.    Skin: Warm, no lesions or  rashes   Assessment & Plan:  Pneumothorax on right Small residual r PTX, no larger than on 6/28 film. No intervention at this time/.   COPD (chronic obstructive pulmonary disease) - continue spiriva - albuterol prn - O2 at all times - discussed smoking cessation.  - PFT end of July, then [facility]

## 2011-03-05 NOTE — Assessment & Plan Note (Signed)
Small residual r PTX, no larger than on 6/28 film. No intervention at this time/.

## 2011-03-05 NOTE — Patient Instructions (Signed)
Please continue Spiriva daily Wear your oxygen at all times Use albuterol nebulized as needed Your CXR shows a small stable pneumothorax compared with 02/21/11 We will follow up early August after your PFT's are done.

## 2011-03-05 NOTE — Assessment & Plan Note (Signed)
-   continue spiriva - albuterol prn - O2 at all times - discussed smoking cessation.  - PFT end of July, then [facility]

## 2011-03-06 ENCOUNTER — Ambulatory Visit: Payer: Medicare Other | Admitting: Emergency Medicine

## 2011-03-09 NOTE — H&P (Signed)
NAMEAUSTYN, SEIER NO.:  [id number]  MEDICAL RECORD NO.:  [id number]  LOCATION:  2116                         FACILITY:  [facility]  PHYSICIAN:  Orbie Hurst, MD         DATE OF BIRTH:  26-Jun-1931  DATE OF ADMISSION:  02/09/2011 DATE OF DISCHARGE:                             HISTORY & PHYSICAL   REASON FOR ADMISSION:  Right spontaneous pneumothorax.  HISTORY OF PRESENT ILLNESS:  The patient is a is a 77 year old Caucasian male with a past medical history of COPD on home oxygen 2 liters nasal cannula, history of congestive heart failure, history of atrial fibrillation status post pacemaker placement, history of heavy tobacco abuse and right spontaneous pneumothorax diagnosed in May 2012 status post chest tube placement who comes today to [facility] to the ER due to worsening of shortness of breath.  As per the patient and as per the medical records, he was admitted to [facility] in May 2011 after he developed a spontaneous right- sided pneumothorax due to severe bullous emphysema.  The patient underwent a chest tube placement, requiring intermittent suction/water seal and was removed on Jan 16, 2011.  Chest x-ray post removal showed no evidence of pneumothorax.  The patient then had a followup appointment by Dr. Edwyna Shell and a chest x-ray showed no recurrence of his pneumothorax but he was stable on 2 liters nasal cannula.  He reports that he had a followup appointment about 5 days ago with Dr. Delton Coombes from Pulmonary and he was told to come back to [facility] next Monday for chest tube placement and possible pleurodesis due to recurrent pneumothorax.  The patient reports that today in the morning, he was having more shortness of breath, worse with exertion but he denies any cough, sputum production, fevers, chills, night sweats, chest pain, hemoptysis, or syncopal episodes.  He initially came to [facility] to the ER and chest x-ray showed  unchanged loculated right lateral pneumothorax of about 20-30%.  The patient has been hemodynamically stable with O2 saturation 100% on 2 liters nasal cannula and a pulmonary and critical care consultation and admission was requested by Dr. Eber Hong from the ER for further management.  REVIEW OF SYSTEMS:  All other systems were negative except as above in HPI.  PAST MEDICAL HISTORY: 1. COPD on home oxygen, 2 liters nasal cannula. 2. Right spontaneous pneumothorax secondary to severe bullous     emphysema. 3. History of biventricular heart failure with ejection fraction of     about 20-25% with also pulmonary hypertension. 4. History of AFib status post pacemaker on anticoagulation with     Coumadin. 5. History of tobacco abuse. 6. Coronary artery disease status post coronary artery bypass grafting     in 2003. 7. History of hypertension. 8. History of dyslipidemia. 9. History of strokes.  PAST SURGICAL HISTORY:  Status post appendectomy many years ago, status post CABG, and status post pacemaker placement.  ALLERGIES:  No known drug allergies.  SOCIAL HISTORY:  The patient used to smoke 1-2 packs a day for more than 60 years and he is now smoking 1-2 cigarettes a day after  he was diagnosed with right spontaneous pneumothorax in May 2012.  He denies any alcohol or illicit drug use.  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  He lives in [place].  He denies any significant occupational exposure.  FAMILY HISTORY:  Mother died at age 17 due to coronary artery disease and father died at age 95 due to coronary artery disease and complications of diabetes.  PHYSICAL EXAMINATION:  GENERAL:  The patient is alert, oriented x3, cooperative in no acute respiratory distress. VITAL SIGNS:  Blood pressure is 125/78, temperature 37, respiratory rate 20, heart rate 72, O2 saturation 100% on 2 liters nasal cannula. HEENT:  Oral mucosa is moist.  No JVD.  No  cervical or supraclavicular lymphadenopathy.  No oral thrush.  No postnasal drip. CARDIOVASCULAR:  Irregular rhythm.  S1 and S2 normal. LUNGS:  Increased AP diameter of the thorax with mildly decreased air entry.  With mild respiratory wheezing bilaterally. ABDOMEN:  Soft, nontender, nondistended.  Bowel sounds active.  No peritoneal signs. EXTREMITIES:  No lower extremity edema.  No calf tenderness. NEUROLOGIC:  Alert, oriented x3.  No focal deficits.  LABORATORY AND IMAGING DATA:  Chest x-ray February 09, 2011 with unchanged loculated right lateral pneumothorax of 20-30%.  CBC:  WBC 9.7, hemoglobin 12.4, hematocrit 38.7, platelet count 185,000. Chem 8 stat:  Sodium 144, potassium 4.4, chloride 101, glucose 110, BUN 25, creatinine 130,  PT 1.27, PTT 32.  Echocardiogram October 31, 2010 with left ventricular ejection fraction of 20-25% with diffuse hypokinesis, mild aortic stenoses, dilated right ventricle, dilated right atrium, moderate tricuspid regurgitation and pulmonary artery systolic pressure of 41 mmHg.  ASSESSMENT/PLAN:  The patient is a 77 year old Caucasian male with a past medical history of emphysema on home oxygen 2 liters nasal cannula complicated by right spontaneous pneumothorax secondary to bullous emphysema, history of atrial fibrillation status post pacemaker on chronic anticoagulation with Coumadin, history of stroke, history of coronary artery disease status post coronary artery bypass graft, history of congestive heart failure, and pulmonary hypertension and tobacco abuse who comes to the ER due to worsening of shortness of breath for the last day and history of recurrence of right-sided pneumothorax. 1. Right spontaneous pneumothorax secondary to bullous emphysema.  The     patient was initially admitted in May due to right-sided     pneumothorax and chest tube was placed with improvement of the     pneumothorax.  He then had a followup chest x-ray on February 06, 2011     which shows recurrence of the right-sided pneumothorax of     approximately 30%.  The patient today reports worsening of his     respiratory symptoms including more shortness of breath.  On     physical exam, he has active wheezing and chest x-ray shows     unchanged loculated right-sided pneumothorax of 20-30%.  I spoke     personally to Dr. Delton Coombes tonight and the patient will be transferred     to the MICU.  The final treatment will be to place a second chest     tube for possible pleurodesis due to recurrent right-sided     pneumothorax.  The patient does not have signs of acute respiratory     distress or tension pneumothorax at this point in time.  We     will monitor him closely in the ICU and we will place a chest tube. 2. History of chronic obstructive  pulmonary disease.  The patient has     severe chronic obstructive pulmonary disease on home oxygen 2     liters nasal cannula.  He has active wheezing and we will start     treatment for chronic obstructive pulmonary disease exacerbation     with Solu-Medrol and he will have bronchodilators q.6 h. and q.4 h.     p.r.n.  We will repeat chest x-ray in the morning.  Again the     patient will have a second chest tube placement and the final     treatment will be possible pleurodesis due to recurrent spontaneous     pneumothorax secondary to severe bullous emphysema. 3. Atrial fibrillation status post pacemaker on chronic     anticoagulation with Coumadin.  The patient reports that he did he     stop Coumadin about 3 days ago and the INR today is 1.27.  We will     continue holding anticoagulation for now for chest tube placement. 4. History of congestive heart failure with ejection fraction 20-30%.     Likely due to coronary artery disease.  The patient will continue     on home medications including metoprolol and Lasix. 5. Pulmonary hypertension likely secondary to congestive heart disease     with ejection fraction 20-25%  and also due to severe lung disease,     due to severe bullous emphysema.  We will continue treatment for     chronic obstructive pulmonary disease exacerbations and we will     continue diuretics and beta-blocker for congestive heart failure. 6. History of a stroke.  We will continue Lipitor and we will hold     anticoagulation or aspirin for now due to chest tube placement. 7. Deep venous thrombosis prophylaxis.  SCDs. 8. Fluids, electrolytes, and nutrition.  We will keep him n.p.o. for     now and we will replace electrolytes as needed.  We will minimize     IV fluids.  CODE STATUS:  Full code.  The patient will be transferred to the MICU for further management.     Orbie Hurst, MD     JR/MEDQ  D:  02/10/2011  T:  02/10/2011  Job:  [id number]  Electronically Signed by Orbie Hurst M.D. on 03/09/2011 09:51:08 PM

## 2011-03-20 ENCOUNTER — Ambulatory Visit (INDEPENDENT_AMBULATORY_CARE_PROVIDER_SITE_OTHER): Payer: Medicare Other | Admitting: Emergency Medicine

## 2011-03-20 DIAGNOSIS — J449 Chronic obstructive pulmonary disease, unspecified: Secondary | ICD-10-CM

## 2011-03-20 LAB — PULMONARY FUNCTION TEST

## 2011-03-20 NOTE — Progress Notes (Signed)
PFT done today. 

## 2011-03-29 ENCOUNTER — Ambulatory Visit (INDEPENDENT_AMBULATORY_CARE_PROVIDER_SITE_OTHER): Payer: Medicare Other | Admitting: Emergency Medicine

## 2011-03-29 ENCOUNTER — Encounter: Payer: Self-pay | Admitting: Emergency Medicine

## 2011-03-29 DIAGNOSIS — J939 Pneumothorax, unspecified: Secondary | ICD-10-CM

## 2011-03-29 DIAGNOSIS — J9383 Other pneumothorax: Secondary | ICD-10-CM

## 2011-03-29 DIAGNOSIS — J449 Chronic obstructive pulmonary disease, unspecified: Secondary | ICD-10-CM

## 2011-03-29 MED ORDER — TIOTROPIUM BROMIDE MONOHYDRATE 18 MCG IN CAPS: 18.0000 ug | ORAL_CAPSULE | Freq: Every day | RESPIRATORY_TRACT | Status: DC

## 2011-03-29 NOTE — Assessment & Plan Note (Signed)
Repeat CXR next OV

## 2011-03-29 NOTE — Assessment & Plan Note (Signed)
We will restart the Spiriva qd, SABA prn Repeat CXR next time Wears O2 at all times.

## 2011-03-29 NOTE — Patient Instructions (Signed)
We will restart Spiriva daily Use albuterol or xopenex as needed for shortness of breath Use your oxygen at all times Follow up with Dr Delton Coombes in 6 months with a CXR Call our office if you develop any changes in your breathing.

## 2011-03-29 NOTE — Progress Notes (Signed)
  Subjective:    Patient ID: [name], male    DOB: 1930/09/24, 77 y.o.   MRN: [id number]  HPI 77 yo man with hx COPD, active smoker,  followed by Dr Algie Coffer. Also with CAD, A Fib. Seen by pulmonary for Pneumothorax 01/2011   [facility]   He was admitted and treated for spontaneous R PTX. Discharged with resolution of PTX, but only on prn xopenex as his BD. He has remained SOB, not using the xopenex. Still not near prior baseline (before PTX).   [facility]/11 [facility]  Readmitted for for recurrent right pnuemothorax. He required a chest tube. CXR prior to discharged with improving PTX.   Today chest xray shows resolved PTX.  Since discharge he feels he is better with less dyspnea and right sided pain.  He has not quit smoking we discussed smoking cesstation.   ROV 03/05/11 - follows up for severe COPD and R PTX. He was readmitted as above for recurrence, now s/p another chest tube. Using albuterol prn about twice a day, xopenex prn. Just started Spiriva on 6/28 - he thinks it is helping. Continues to smoke 4 -5 a day, wants to try to stop. Was d/c to home on O2. CXR today with small residual R PTX, no change from 6/27 but more noticeable than 6/22 discharge. His breathing has not decompensated as in the past when he had recurrent ptx.   ROV 03/29/11 -- severe COPD and R PTX. Follows up after PFT 7/25 - FEV1 40% predicted. Currently on Spiriva + SABA. Continues to have his baseline SOB. He ran out of Spiriva and hasn't been taking.      Objective:   Physical Exam GEN: A/Ox3; pleasant , NAD, thin  HEENT:  [place]/AT,  EACs-clear, TMs-wnl, NOSE-clear, THROAT-clear, no lesions, no postnasal drip or exudate noted.   NECK:  Supple w/ fair ROM; no JVD; normal carotid impulses w/o bruits; no thyromegaly or nodules palpated; no lymphadenopathy.  RESP  Coarse BS w/ diminshed bs in bases no accessory muscle use, no dullness to percussion, mild end exp wheeze  CARD:  RRR, no m/r/g  , no  peripheral edema, pulses intact, no cyanosis or clubbing.  Musco: Warm bil, no deformities or joint swelling noted.   Neuro: alert, no focal deficits noted.    Skin: Warm, no lesions or rashes   Assessment & Plan:  Pneumothorax on right Repeat CXR next OV  COPD (chronic obstructive pulmonary disease) We will restart the Spiriva qd, SABA prn Repeat CXR next time Wears O2 at all times.

## 2011-04-05 ENCOUNTER — Encounter: Payer: Self-pay | Admitting: Internal Medicine

## 2011-04-08 ENCOUNTER — Encounter: Payer: Self-pay | Admitting: Internal Medicine

## 2011-04-08 ENCOUNTER — Ambulatory Visit (INDEPENDENT_AMBULATORY_CARE_PROVIDER_SITE_OTHER): Payer: Medicare Other | Admitting: Internal Medicine

## 2011-04-08 ENCOUNTER — Emergency Department (HOSPITAL_COMMUNITY): Payer: Medicare Other

## 2011-04-08 ENCOUNTER — Inpatient Hospital Stay (HOSPITAL_COMMUNITY)
Admission: EM | Admit: 2011-04-08 | Discharge: 2011-04-14 | DRG: 194 | Disposition: A | Discharge status: Home / Self Care | Payer: Medicare Other | Attending: Cardiovascular Disease | Admitting: Cardiovascular Disease

## 2011-04-08 VITALS — BP 140/88 | HR 64 | Resp 16 | Ht 73.0 in | Wt 152.0 lb

## 2011-04-08 DIAGNOSIS — Z7901 Long term (current) use of anticoagulants: Secondary | ICD-10-CM

## 2011-04-08 DIAGNOSIS — I252 Old myocardial infarction: Secondary | ICD-10-CM

## 2011-04-08 DIAGNOSIS — I10 Essential (primary) hypertension: Secondary | ICD-10-CM | POA: Diagnosis present

## 2011-04-08 DIAGNOSIS — I4891 Unspecified atrial fibrillation: Secondary | ICD-10-CM

## 2011-04-08 DIAGNOSIS — Z8673 Personal history of transient ischemic attack (TIA), and cerebral infarction without residual deficits: Secondary | ICD-10-CM

## 2011-04-08 DIAGNOSIS — Z951 Presence of aortocoronary bypass graft: Secondary | ICD-10-CM

## 2011-04-08 DIAGNOSIS — I495 Sick sinus syndrome: Secondary | ICD-10-CM

## 2011-04-08 DIAGNOSIS — I2510 Atherosclerotic heart disease of native coronary artery without angina pectoris: Secondary | ICD-10-CM | POA: Diagnosis present

## 2011-04-08 DIAGNOSIS — Z950 Presence of cardiac pacemaker: Secondary | ICD-10-CM

## 2011-04-08 DIAGNOSIS — J189 Pneumonia, unspecified organism: Principal | ICD-10-CM | POA: Diagnosis present

## 2011-04-08 DIAGNOSIS — Z9981 Dependence on supplemental oxygen: Secondary | ICD-10-CM

## 2011-04-08 DIAGNOSIS — J441 Chronic obstructive pulmonary disease with (acute) exacerbation: Secondary | ICD-10-CM | POA: Diagnosis present

## 2011-04-08 DIAGNOSIS — I442 Atrioventricular block, complete: Secondary | ICD-10-CM

## 2011-04-08 DIAGNOSIS — I2589 Other forms of chronic ischemic heart disease: Secondary | ICD-10-CM | POA: Diagnosis present

## 2011-04-08 LAB — COMPREHENSIVE METABOLIC PANEL WITH GFR
ALT: 10 U/L (ref 0–53)
AST: 24 U/L (ref 0–37)
Alkaline Phosphatase: 72 U/L (ref 39–117)
CO2: 28 meq/L (ref 19–32)
Calcium: 9.8 mg/dL (ref 8.4–10.5)
Chloride: 101 meq/L (ref 96–112)
GFR calc non Af Amer: >60 mL/min (ref >60)
Potassium: 3.9 meq/L (ref 3.5–5.1)
Sodium: 138 meq/L (ref 135–145)

## 2011-04-08 LAB — COMPREHENSIVE METABOLIC PANEL
Albumin: 3.8 g/dL (ref 3.5–5.2)
BUN: 16 mg/dL (ref 6–23)
Creatinine, Ser: 0.74 mg/dL (ref 0.50–1.35)
GFR calc Af Amer: >60 mL/min (ref >60)
Glucose, Bld: 105 mg/dL — ABNORMAL HIGH (ref 70–99)
Total Bilirubin: 0.8 mg/dL (ref 0.3–1.2)
Total Protein: 6.9 g/dL (ref 6.0–8.3)

## 2011-04-08 LAB — CBC
HCT: 39.2 % (ref 39.0–52.0)
Hemoglobin: 12.5 g/dL — ABNORMAL LOW (ref 13.0–17.0)
MCH: 28.6 pg (ref 26.0–34.0)
MCHC: 31.9 g/dL (ref 30.0–36.0)
MCV: 89.7 fL (ref 78.0–100.0)
Platelets: 203 K/uL (ref 150–400)
RBC: 4.37 MIL/uL (ref 4.22–5.81)
RDW: 13.9 % (ref 11.5–15.5)
WBC: 14.3 K/uL — ABNORMAL HIGH (ref 4.0–10.5)

## 2011-04-08 LAB — DIFFERENTIAL
Basophils Absolute: 0 K/uL (ref 0.0–0.1)
Basophils Relative: 0 % (ref 0–1)
Eosinophils Absolute: 0 K/uL (ref 0.0–0.7)
Eosinophils Relative: 0 % (ref 0–5)
Lymphocytes Relative: 8 % — ABNORMAL LOW (ref 12–46)
Lymphs Abs: 1.1 10*3/uL (ref 0.7–4.0)
Monocytes Absolute: 1.2 10*3/uL — ABNORMAL HIGH (ref 0.1–1.0)
Monocytes Relative: 8 % (ref 3–12)
Neutro Abs: 11.9 K/uL — ABNORMAL HIGH (ref 1.7–7.7)
Neutrophils Relative %: 84 % — ABNORMAL HIGH (ref 43–77)

## 2011-04-08 LAB — URINE MICROSCOPIC-ADD ON

## 2011-04-08 LAB — URINALYSIS, ROUTINE W REFLEX MICROSCOPIC
Bilirubin Urine: NEGATIVE
Glucose, UA: NEGATIVE mg/dL
Hgb urine dipstick: NEGATIVE
Ketones, ur: NEGATIVE mg/dL
Leukocytes, UA: NEGATIVE
Nitrite: NEGATIVE
Protein, ur: 100 mg/dL — AB
Specific Gravity, Urine: 1.021 (ref 1.005–1.030)
Urobilinogen, UA: 1 mg/dL (ref 0.0–1.0)
pH: 7 (ref 5.0–8.0)

## 2011-04-08 LAB — PROCALCITONIN: Procalcitonin: <0.1 ng/mL

## 2011-04-08 LAB — LACTIC ACID, PLASMA: Lactic Acid, Venous: 1.1 mmol/L (ref 0.5–2.2)

## 2011-04-08 LAB — PROTIME-INR
INR: 1.67 — ABNORMAL HIGH (ref 0.00–1.49)
Prothrombin Time: 20 seconds — ABNORMAL HIGH (ref 11.6–15.2)

## 2011-04-08 LAB — PRO B NATRIURETIC PEPTIDE: Pro B Natriuretic peptide (BNP): 3593 pg/mL — ABNORMAL HIGH (ref 0–450)

## 2011-04-08 NOTE — Assessment & Plan Note (Signed)
Normal pacemaker function See Pace Art report No changes today  

## 2011-04-08 NOTE — Assessment & Plan Note (Signed)
Stable No change required today  

## 2011-04-08 NOTE — Progress Notes (Signed)
The patient presents today for routine electrophysiology followup.  Since recently having his pacemaker implanted, the patient reports doing very well.  He has severe COPD and requires home O2 but feels that his breathing is slightly improved.  Today, he denies symptoms of palpitations, chest pain, orthopnea, PND, lower extremity edema, dizziness, presyncope, syncope, or neurologic sequela.  The patient feels that he is tolerating medications without difficulties and is otherwise without complaint today.   Past Medical History  Diagnosis Date  . COPD (chronic obstructive pulmonary disease)   . Prostate cancer 2003  . Stroke 2000  . CAD (coronary artery disease)   . AF (atrial fibrillation)   . HTN (hypertension)   . Pneumothorax, right   . Complete heart block     S/P pacemaker insertion// [employer] RFSR   Past Surgical History  Procedure Date  . Coronary artery bypass graft 2003  . Prostatectomy 2003  . Appendectomy   . Eye surgery     bilateral for gaze problem  . Pacemaker insertion     SJM by JA for CHB    Current Outpatient Prescriptions  Medication Sig Dispense Refill  . albuterol (PROVENTIL) (2.5 MG/3ML) 0.083% nebulizer solution Take 3 mLs (2.5 mg total) by nebulization every 6 (six) hours as needed for wheezing.  360 mL  6  . calcium-vitamin D (OSCAL WITH D) 250-125 MG-UNIT per tablet Take 1 tablet by mouth 2 (two) times daily.       Marland Kitchen levalbuterol (XOPENEX HFA) 45 MCG/ACT inhaler Inhale 1-2 puffs into the lungs 4 (four) times daily as needed.        . Multiple Vitamin (MULTIVITAMIN) tablet Take 1 tablet by mouth daily.        . nitroGLYCERIN (NITROSTAT) 0.4 MG SL tablet Place 0.4 mg under the tongue every 5 (five) minutes as needed. As directed       . tiotropium (SPIRIVA) 18 MCG inhalation capsule Place 1 capsule (18 mcg total) into inhaler and inhale daily.  30 capsule  11  . warfarin (COUMADIN) 5 MG tablet Take as directed        No Known Allergies  History    Social History  . Marital Status: Married    Spouse Name: N/A    Number of Children: N/A  . Years of Education: N/A   Occupational History  . retired    Social History Main Topics  . Smoking status: Current Everyday Smoker -- 2.0 packs/day for 60 years  . Smokeless tobacco: Not on file   Comment: pt down to 1-2 ciagrettes daily  . Alcohol Use: No  . Drug Use: No  . Sexually Active: Not on file   Other Topics Concern  . Not on file   Social History Narrative  . No narrative on file    Family History  Problem Relation Age of Onset  . Heart attack Father     died age 56  . Heart disease Mother   . Heart disease Sister   . Heart disease Brother   . Multiple sclerosis Mother     died age 104  . Kidney failure Brother    Physical Exam: Filed Vitals:   04/08/11 1239  BP: 140/88  Pulse: 64  Resp: 16  Height: 6\' 1"  (1.854 m)  Weight: 152 lb (68.947 kg)    GEN- The patient is chronically ill appearing, alert and oriented x 3 today.   Head- normocephalic, atraumatic Eyes-  Sclera clear, conjunctiva pink Ears- hearing intact  Oropharynx- clear Neck- supple, no JVP Lymph- no cervical lymphadenopathy Lungs- decreased BS throughout Chest- pacemaker pocket is well healed Heart- Regular rate and rhythm (paced),   GI- soft, NT, ND, + BS Extremities- no clubbing, cyanosis, or edema  Pacemaker interrogation- reviewed in detail today,  See PACEART report  Assessment and Plan:

## 2011-04-08 NOTE — Assessment & Plan Note (Signed)
Rate controlled permanent afib Continue long term coumadin

## 2011-04-09 LAB — CARDIAC PANEL(CRET KIN+CKTOT+MB+TROPI)
CK, MB: 3.7 ng/mL (ref 0.3–4.0)
CK, MB: 3.9 ng/mL (ref 0.3–4.0)
Relative Index: INVALID (ref 0.0–2.5)
Troponin I: <0.3 ng/mL (ref <0.30)
Troponin I: <0.3 ng/mL (ref <0.30)

## 2011-04-09 LAB — URINE CULTURE
Colony Count: 25000
Culture  Setup Time: 201208140220

## 2011-04-09 LAB — CBC
HCT: 37.3 % — ABNORMAL LOW (ref 39.0–52.0)
Hemoglobin: 12.1 g/dL — ABNORMAL LOW (ref 13.0–17.0)
MCH: 29.1 pg (ref 26.0–34.0)
MCHC: 32.4 g/dL (ref 30.0–36.0)
MCV: 89.7 fL (ref 78.0–100.0)
RDW: 14 % (ref 11.5–15.5)

## 2011-04-09 LAB — LIPID PANEL
Cholesterol: 147 mg/dL (ref 0–200)
Triglycerides: 41 mg/dL (ref <150)
VLDL: 8 mg/dL (ref 0–40)

## 2011-04-09 LAB — BASIC METABOLIC PANEL
BUN: 16 mg/dL (ref 6–23)
CO2: 24 mEq/L (ref 19–32)
Chloride: 101 mEq/L (ref 96–112)
Creatinine, Ser: 0.82 mg/dL (ref 0.50–1.35)
Glucose, Bld: 174 mg/dL — ABNORMAL HIGH (ref 70–99)
Potassium: 3.9 mEq/L (ref 3.5–5.1)

## 2011-04-09 LAB — PROTIME-INR
INR: 1.8 — ABNORMAL HIGH (ref 0.00–1.49)
Prothrombin Time: 21.2 seconds — ABNORMAL HIGH (ref 11.6–15.2)

## 2011-04-09 LAB — PROCALCITONIN: Procalcitonin: 0.16 ng/mL

## 2011-04-10 LAB — PROTIME-INR: Prothrombin Time: 26 seconds — ABNORMAL HIGH (ref 11.6–15.2)

## 2011-04-12 LAB — DIFFERENTIAL
Lymphs Abs: 1.8 10*3/uL (ref 0.7–4.0)
Monocytes Absolute: 1 10*3/uL (ref 0.1–1.0)
Monocytes Relative: 15 % — ABNORMAL HIGH (ref 3–12)
Neutro Abs: 3.8 10*3/uL (ref 1.7–7.7)
Neutrophils Relative %: 55 % (ref 43–77)

## 2011-04-12 LAB — BASIC METABOLIC PANEL
BUN: 16 mg/dL (ref 6–23)
CO2: 29 mEq/L (ref 19–32)
Calcium: 9.2 mg/dL (ref 8.4–10.5)
Creatinine, Ser: 0.88 mg/dL (ref 0.50–1.35)
Glucose, Bld: 93 mg/dL (ref 70–99)

## 2011-04-12 LAB — CBC
HCT: 34.5 % — ABNORMAL LOW (ref 39.0–52.0)
Hemoglobin: 11.3 g/dL — ABNORMAL LOW (ref 13.0–17.0)
MCH: 29.3 pg (ref 26.0–34.0)
MCHC: 32.8 g/dL (ref 30.0–36.0)
MCV: 89.4 fL (ref 78.0–100.0)
RBC: 3.86 MIL/uL — ABNORMAL LOW (ref 4.22–5.81)

## 2011-04-13 LAB — PROTIME-INR: INR: 2.3 — ABNORMAL HIGH (ref 0.00–1.49)

## 2011-04-14 ENCOUNTER — Inpatient Hospital Stay (HOSPITAL_COMMUNITY): Payer: Medicare Other

## 2011-04-14 LAB — COMPREHENSIVE METABOLIC PANEL WITH GFR
ALT: 11 U/L (ref 0–53)
AST: 17 U/L (ref 0–37)
Albumin: 2.7 g/dL — ABNORMAL LOW (ref 3.5–5.2)
Alkaline Phosphatase: 61 U/L (ref 39–117)
BUN: 18 mg/dL (ref 6–23)
CO2: 31 meq/L (ref 19–32)
Calcium: 9.3 mg/dL (ref 8.4–10.5)
Chloride: 103 meq/L (ref 96–112)
Creatinine, Ser: 1.04 mg/dL (ref 0.50–1.35)
GFR calc Af Amer: >60 mL/min
GFR calc non Af Amer: >60 mL/min
Glucose, Bld: 92 mg/dL (ref 70–99)
Potassium: 4.1 meq/L (ref 3.5–5.1)
Sodium: 139 meq/L (ref 135–145)
Total Bilirubin: 0.3 mg/dL (ref 0.3–1.2)
Total Protein: 5.8 g/dL — ABNORMAL LOW (ref 6.0–8.3)

## 2011-04-14 LAB — CBC
HCT: 34.6 % — ABNORMAL LOW (ref 39.0–52.0)
Hemoglobin: 11 g/dL — ABNORMAL LOW (ref 13.0–17.0)
MCHC: 31.8 g/dL (ref 30.0–36.0)
MCV: 90.8 fL (ref 78.0–100.0)

## 2011-04-14 LAB — PROTIME-INR: Prothrombin Time: 28.3 seconds — ABNORMAL HIGH (ref 11.6–15.2)

## 2011-04-15 ENCOUNTER — Ambulatory Visit (INDEPENDENT_AMBULATORY_CARE_PROVIDER_SITE_OTHER): Payer: Medicare Other | Admitting: Ophthalmology

## 2011-04-15 LAB — CULTURE, BLOOD (ROUTINE X 2)
Culture  Setup Time: 201208140258
Culture  Setup Time: 201208140258
Culture: NO GROWTH
Culture: NO GROWTH

## 2011-04-15 NOTE — Discharge Summary (Signed)
[name], [name] NO.:  [id number]  MEDICAL RECORD NO.:  [id number]  LOCATION:  1416                         FACILITY:  [facility]  PHYSICIAN:  Ricki Rodriguez, M.D.  DATE OF BIRTH:  04-01-1931  DATE OF ADMISSION:  04/08/2011 DATE OF DISCHARGE:  04/14/2011                              DISCHARGE SUMMARY   DISCHARGING PHYSICIAN:  Dr. Orpah Cobb  FINAL DIAGNOSES: 1. Left lung pneumonia. 2. Severe chronic obstructive pulmonary disease. 3. Coronary artery disease. 4. Hypertension. 5. Atrial fibrillation. 6. Status post pacemaker placement.  DISCHARGE MEDICATIONS: 1. Cipro 500 mg 1 twice daily for 4 days. 2. Carvedilol 3.125 mg 1 twice daily. 3. Lasix 20 mg 1 daily. 4. Losartan 50 mg 1 daily. 5. Protonix 40 mg daily. 6. Simvastatin 10 mg daily. 7. Albuterol 2.5 mg in 3 mL of saline nebulization every 6 hours as     needed. 8. Calcium with vitamin D over-the-counter 1 daily. 9. Coumadin 7.5 mg on Monday, Wednesday, Friday and half of 7.5 mg on     rest of the 4 days. 10.Multivitamin 1 daily. 11.Nitroglycerin sublingual 0.4 mg, 1 under the tongue every 5 minutes     x3 as needed for chest pain. 12.Spiriva 18 mcg 1 daily.  DISCHARGE ACTIVITY:  The patient can engage in activity gradually as tolerated, and must use cane or walker for ambulation.  Followup by Dr. Orpah Cobb in 2 weeks.  CONDITION ON DISCHARGE:  Improved.  HISTORY:  This 77 year old white male presented with shortness of breath and easy fatigability while working in the yard along with cold chills and temperature of 102 degrees Fahrenheit.  PHYSICAL EXAMINATION:  VITAL SIGNS:  The patient was alert and oriented x3, in mild respiratory distress.  Blood pressure 164/77, pulse 60, and T-max 102.1. HEENT:  Conjunctiva pink. NECK:  Supple. No JVD. LUNGS:  Decreased breath sounds on left base along with some rhonchi and rales. HEART:  Irregularly irregular rhythm.  Soft systolic  murmur. ABDOMEN:  Soft.  Bowel sounds present, nontender. EXTREMITIES:  No edema, cyanosis, or clubbing.  LABORATORY DATA:  Normal hemoglobin/hematocrit elevated WBC count of 14,300, platelet count normal at 203,000, and subsequent WBC count was down to 6300 on August 19.  Electrolytes were normal.  Glucose borderline at 105, BUN 16, and creatinine 0.74.  Urinalysis was essentially unremarkable except for 100 mg of protein, PT/INR was 1.67. Subsequent INR was 2.6.  X-ray of the chest showed a left lower lobe infiltrate, cardiomegaly without pulmonary edema.  Subsequent chest x- ray on August 19, showed improved aeration of left lower lobe.  EKG showed atrial fibrillation with left bundle-branch block and paced rhythm.  HOSPITAL COURSE:  The patient was admitted to telemetry bed.  He was started on IV antibiotic Zosyn and vancomycin.  His Coumadin dose was adjusted by the pharmacy with daily PT/INR.  He received breathing treatments.  He had slow and steady improvement over 1 week of hospitalization, and he was discharged home in satisfactory condition with a followup by me in 2 weeks.     Ricki Rodriguez, M.D.     ASK/MEDQ  D:  04/14/2011  T:  04/15/2011  Job:  [id number]  Electronically Signed by Orpah Cobb M.D. on 04/15/2011 05:02:09 AM

## 2011-04-22 ENCOUNTER — Ambulatory Visit (INDEPENDENT_AMBULATORY_CARE_PROVIDER_SITE_OTHER): Payer: Medicare Other | Admitting: Ophthalmology

## 2011-04-22 DIAGNOSIS — H35349 Macular cyst, hole, or pseudohole, unspecified eye: Secondary | ICD-10-CM

## 2011-04-22 DIAGNOSIS — H34219 Partial retinal artery occlusion, unspecified eye: Secondary | ICD-10-CM

## 2011-04-22 DIAGNOSIS — H35379 Puckering of macula, unspecified eye: Secondary | ICD-10-CM

## 2011-04-22 DIAGNOSIS — H43819 Vitreous degeneration, unspecified eye: Secondary | ICD-10-CM

## 2011-06-06 LAB — PROTIME-INR
INR: 1.1
Prothrombin Time: 14.7

## 2011-06-06 LAB — CBC
Hemoglobin: 15.6
MCHC: 33.2
MCV: 88.7
RBC: 5.32
RDW: 13.8

## 2011-06-06 LAB — DIFFERENTIAL
Eosinophils Absolute: 0.4
Lymphocytes Relative: 32
Lymphs Abs: 3.4 — ABNORMAL HIGH
Monocytes Relative: 11
Neutro Abs: 5.7
Neutrophils Relative %: 53

## 2011-06-06 LAB — APTT: aPTT: 42 — ABNORMAL HIGH

## 2011-06-06 LAB — COMPREHENSIVE METABOLIC PANEL
BUN: 12
CO2: 29
Calcium: 9.9
Creatinine, Ser: 1.02
GFR calc non Af Amer: >60
Glucose, Bld: 87
Total Protein: 6.7

## 2011-08-29 ENCOUNTER — Encounter: Payer: Self-pay | Admitting: Internal Medicine

## 2011-09-25 ENCOUNTER — Other Ambulatory Visit: Payer: Self-pay | Admitting: Emergency Medicine

## 2011-09-25 DIAGNOSIS — J449 Chronic obstructive pulmonary disease, unspecified: Secondary | ICD-10-CM

## 2011-09-26 ENCOUNTER — Ambulatory Visit (INDEPENDENT_AMBULATORY_CARE_PROVIDER_SITE_OTHER)
Admission: RE | Admit: 2011-09-26 | Discharge: 2011-09-26 | Disposition: A | Discharge status: Home / Self Care | Payer: Medicare Other | Source: Ambulatory Visit | Attending: Emergency Medicine | Admitting: Emergency Medicine

## 2011-09-26 ENCOUNTER — Encounter: Payer: Self-pay | Admitting: Emergency Medicine

## 2011-09-26 ENCOUNTER — Ambulatory Visit (INDEPENDENT_AMBULATORY_CARE_PROVIDER_SITE_OTHER): Payer: Medicare Other | Admitting: Emergency Medicine

## 2011-09-26 DIAGNOSIS — J449 Chronic obstructive pulmonary disease, unspecified: Secondary | ICD-10-CM

## 2011-09-26 NOTE — Patient Instructions (Signed)
Continue your Spiriva Use your rescue inhaler as needed Repeat walking oximetry today Follow with Dr Delton Coombes in 6 months or sooner if you have any problems

## 2011-09-26 NOTE — Progress Notes (Signed)
  Subjective:    Patient ID: [name], male    DOB: 02/28/1931, 78 y.o.   MRN: [id number]  HPI 78 yo man with hx COPD, active smoker,  followed by Dr Algie Coffer. Also with CAD, A Fib. Seen by pulmonary for Pneumothorax 01/2011   [facility]   He was admitted and treated for spontaneous R PTX. Discharged with resolution of PTX, but only on prn xopenex as his BD. He has remained SOB, not using the xopenex. Still not near prior baseline (before PTX).   [facility]/11 [facility]  Readmitted for for recurrent right pnuemothorax. He required a chest tube. CXR prior to discharged with improving PTX.   Today chest xray shows resolved PTX.  Since discharge he feels he is better with less dyspnea and right sided pain.  He has not quit smoking we discussed smoking cesstation.   ROV 03/05/11 - follows up for severe COPD and R PTX. He was readmitted as above for recurrence, now s/p another chest tube. Using albuterol prn about twice a day, xopenex prn. Just started Spiriva on 6/28 - he thinks it is helping. Continues to smoke 4 -5 a day, wants to try to stop. Was d/c to home on O2. CXR today with small residual R PTX, no change from 6/27 but more noticeable than 6/22 discharge. His breathing has not decompensated as in the past when he had recurrent ptx.   ROV 03/29/11 -- severe COPD and R PTX. Follows up after PFT 7/25 - FEV1 40% predicted. Currently on Spiriva + SABA. Continues to have his baseline SOB. He ran out of Spiriva and hasn't been taking.   ROV 09/26/11 -- severe COPD, hx R PTX, on Spiriva. Since last visit his O2 was stopped after he underwent reassuring walking oximetry. Denies CP, does wheeze but no different than usual, some cough. He was treated for PNA this fall, admitted to [facility].      Objective:   Physical Exam GEN: A/Ox3; pleasant , NAD, thin  HEENT:  [place]/AT,  EACs-clear, TMs-wnl, NOSE-clear, THROAT-clear, no lesions, no postnasal drip or exudate noted.   NECK:  Supple w/ fair ROM;  no JVD; normal carotid impulses w/o bruits; no thyromegaly or nodules palpated; no lymphadenopathy.  RESP  Coarse BS w/ diminshed bs in bases no accessory muscle use, no dullness to percussion, no wheeze  CARD:  RRR, no m/r/g  , no peripheral edema, pulses intact, no cyanosis or clubbing.  Musco: Warm bil, no deformities or joint swelling noted.   Neuro: alert, no focal deficits noted.    Skin: Warm, no lesions or rashes   Assessment & Plan:  COPD (chronic obstructive pulmonary disease) Has been doing well since last visit with the exception of admission to the hospital in August. He is on Spiriva daily and albuterol when necessary which he never requires. Walking oximetry has shown that he did not desaturate.  - Repeat walking oximetry today - Continue Spiriva - followup in 6 month

## 2011-09-26 NOTE — Assessment & Plan Note (Signed)
Has been doing well since last visit with the exception of admission to the hospital in August. He is on Spiriva daily and albuterol when necessary which he never requires. Walking oximetry has shown that he did not desaturate.  - Repeat walking oximetry today - Continue Spiriva - followup in 6 month

## 2011-10-10 IMAGING — CR DG CHEST 1V PORT
2 series · 2 of 2 positions shown · non-contrast
Comparison: 02/06/2011

CLINICAL DATA: Shortness of breath with cough.

PORTABLE CHEST - 1 VIEW

[AP (1 of 2)]
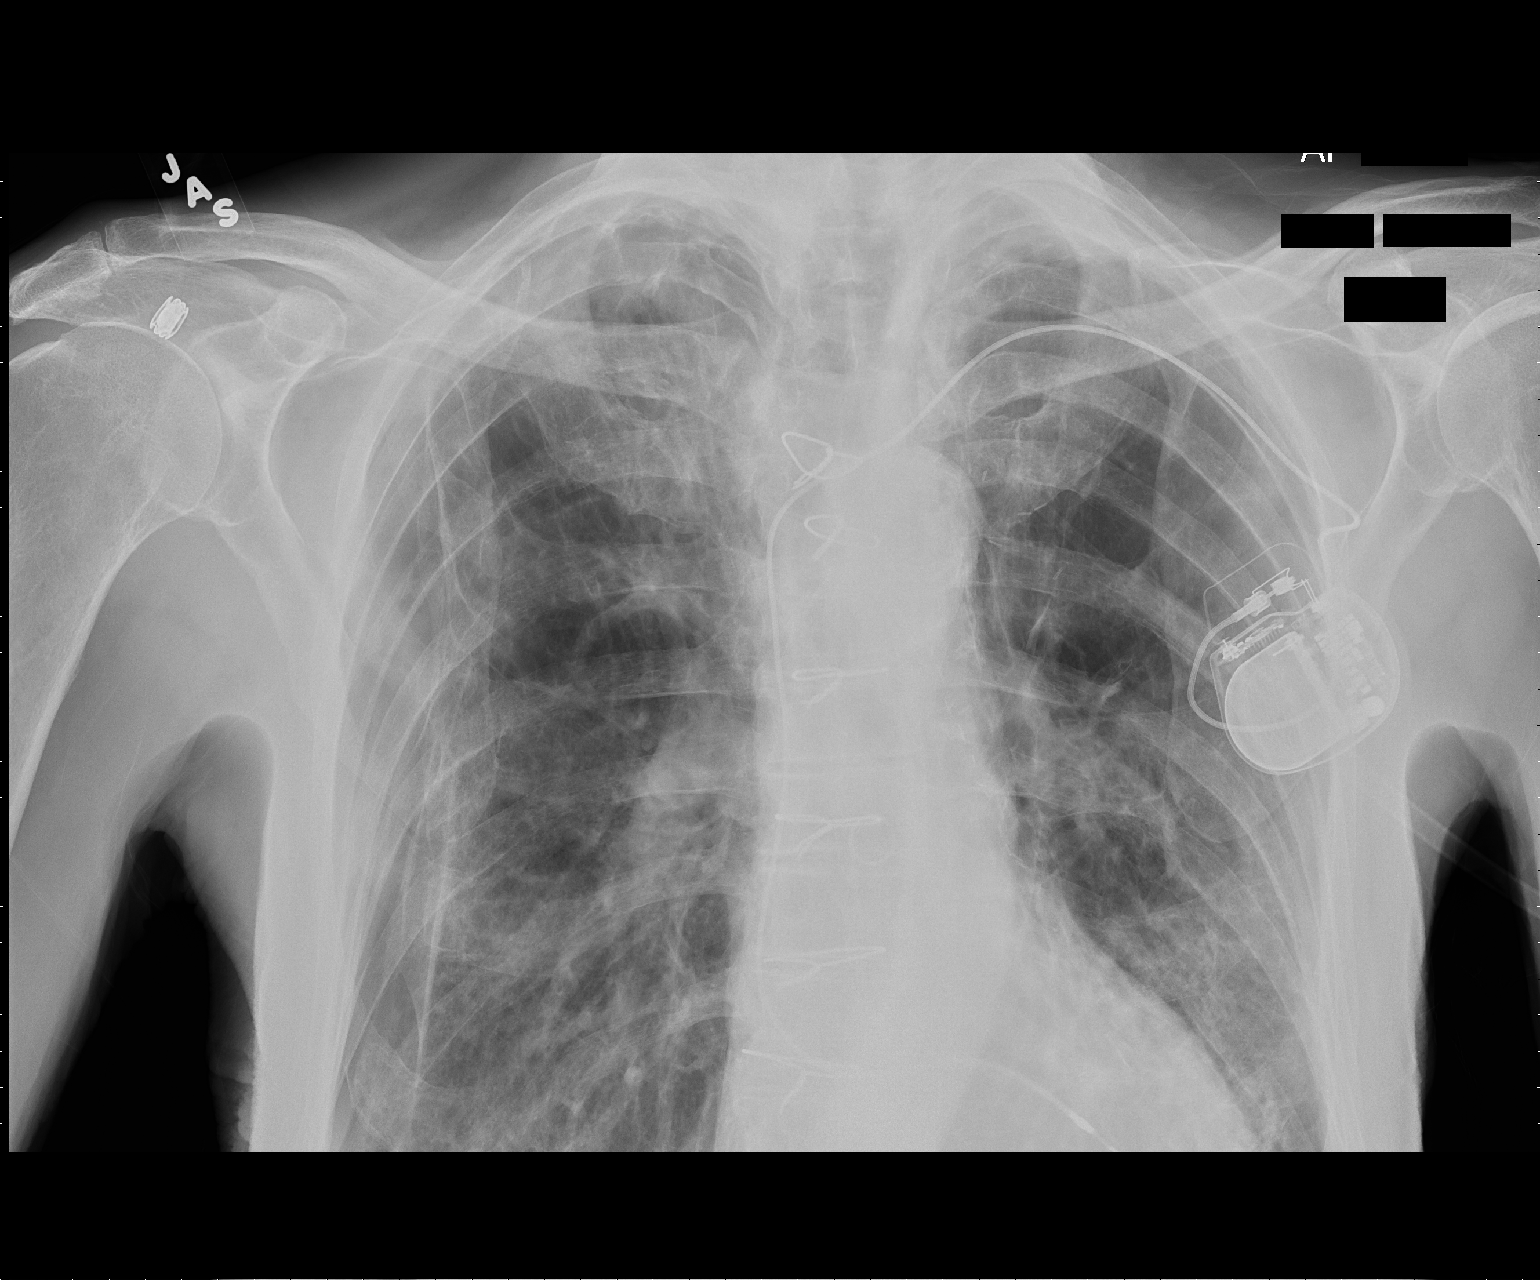

[AP (2 of 2)]
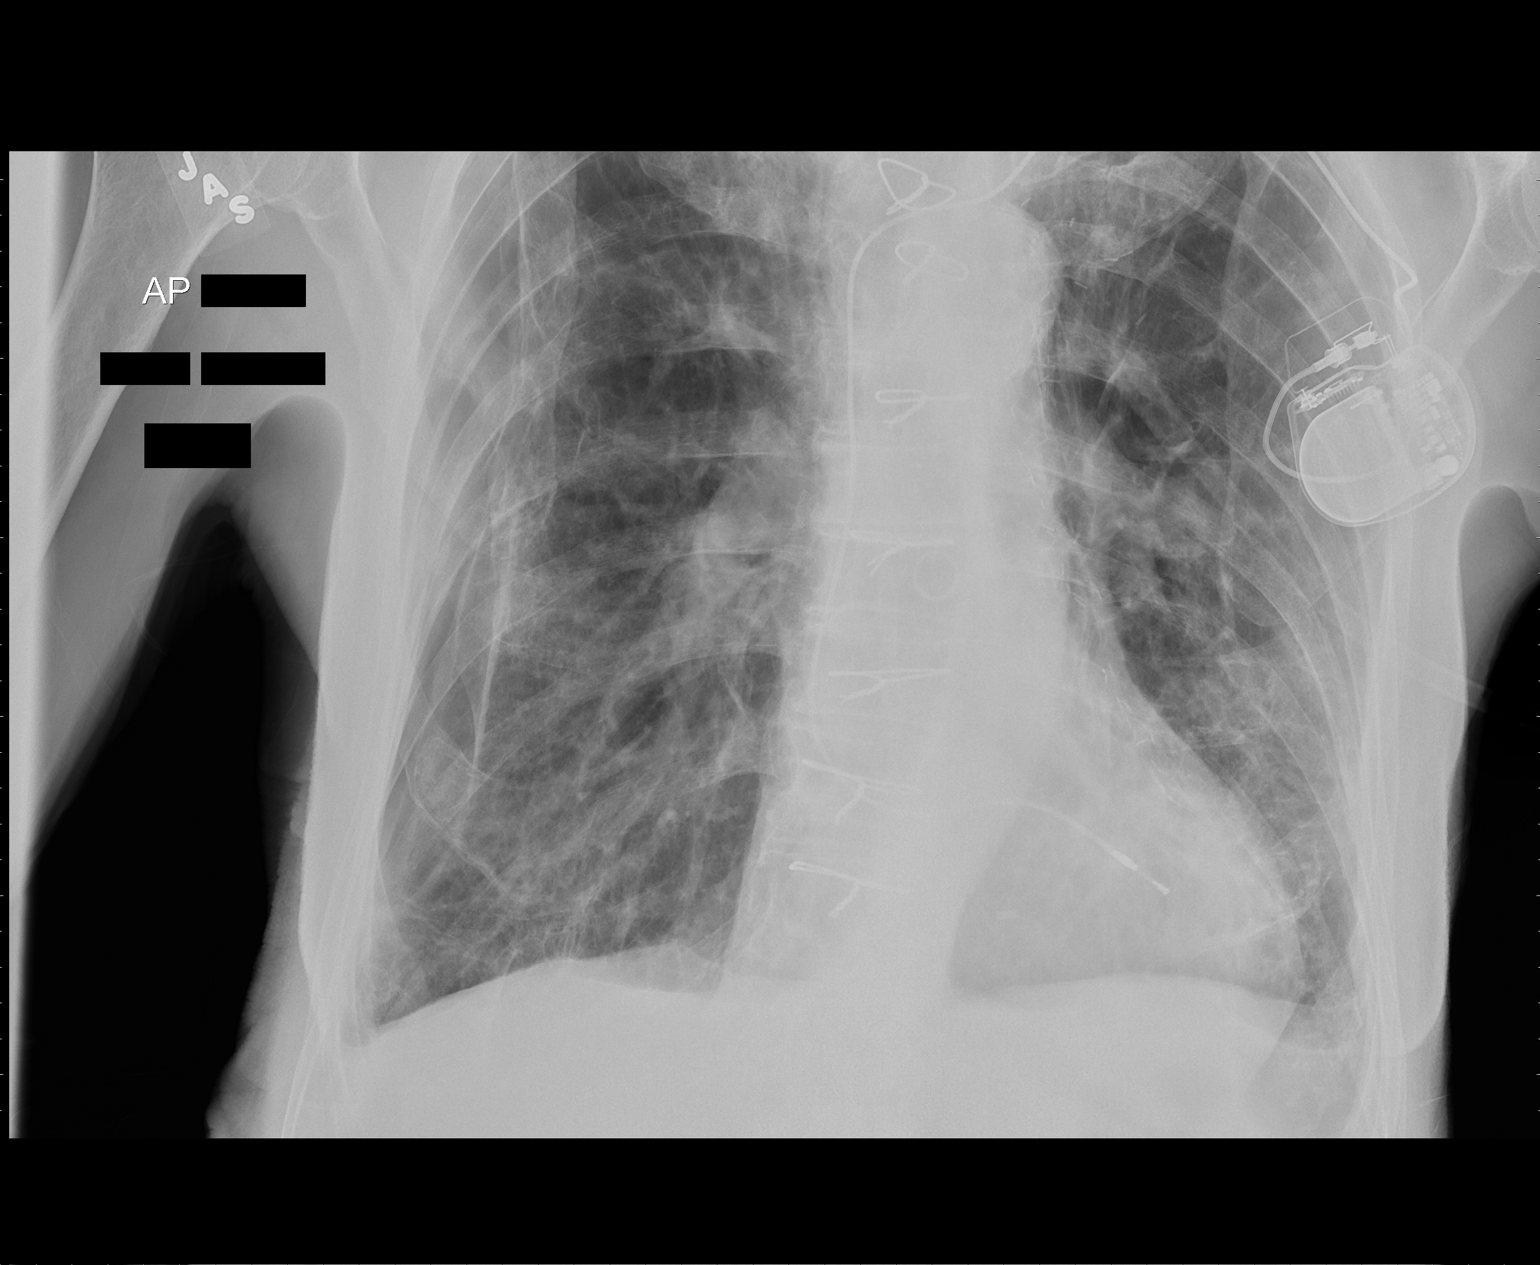

[2 of 2 positions shown; findings below may reference images not displayed]

FINDINGS: Loculated lateral right pneumothorax redemonstrated, 20-
30%.   COPD.  Cardiomegaly.  Unchanged permanent transvenous pacer.
Osteopenia.
IMPRESSION: Unchanged in loculated right lateral pneumothorax 20-30%.

[REDACTED] aware.

## 2011-10-11 IMAGING — CR DG CHEST 1V PORT
1 series · 1 of 1 positions shown · non-contrast
Comparison: 02/09/2011

CLINICAL DATA: Respiratory distress.

PORTABLE CHEST - 1 VIEW

[view not recorded]
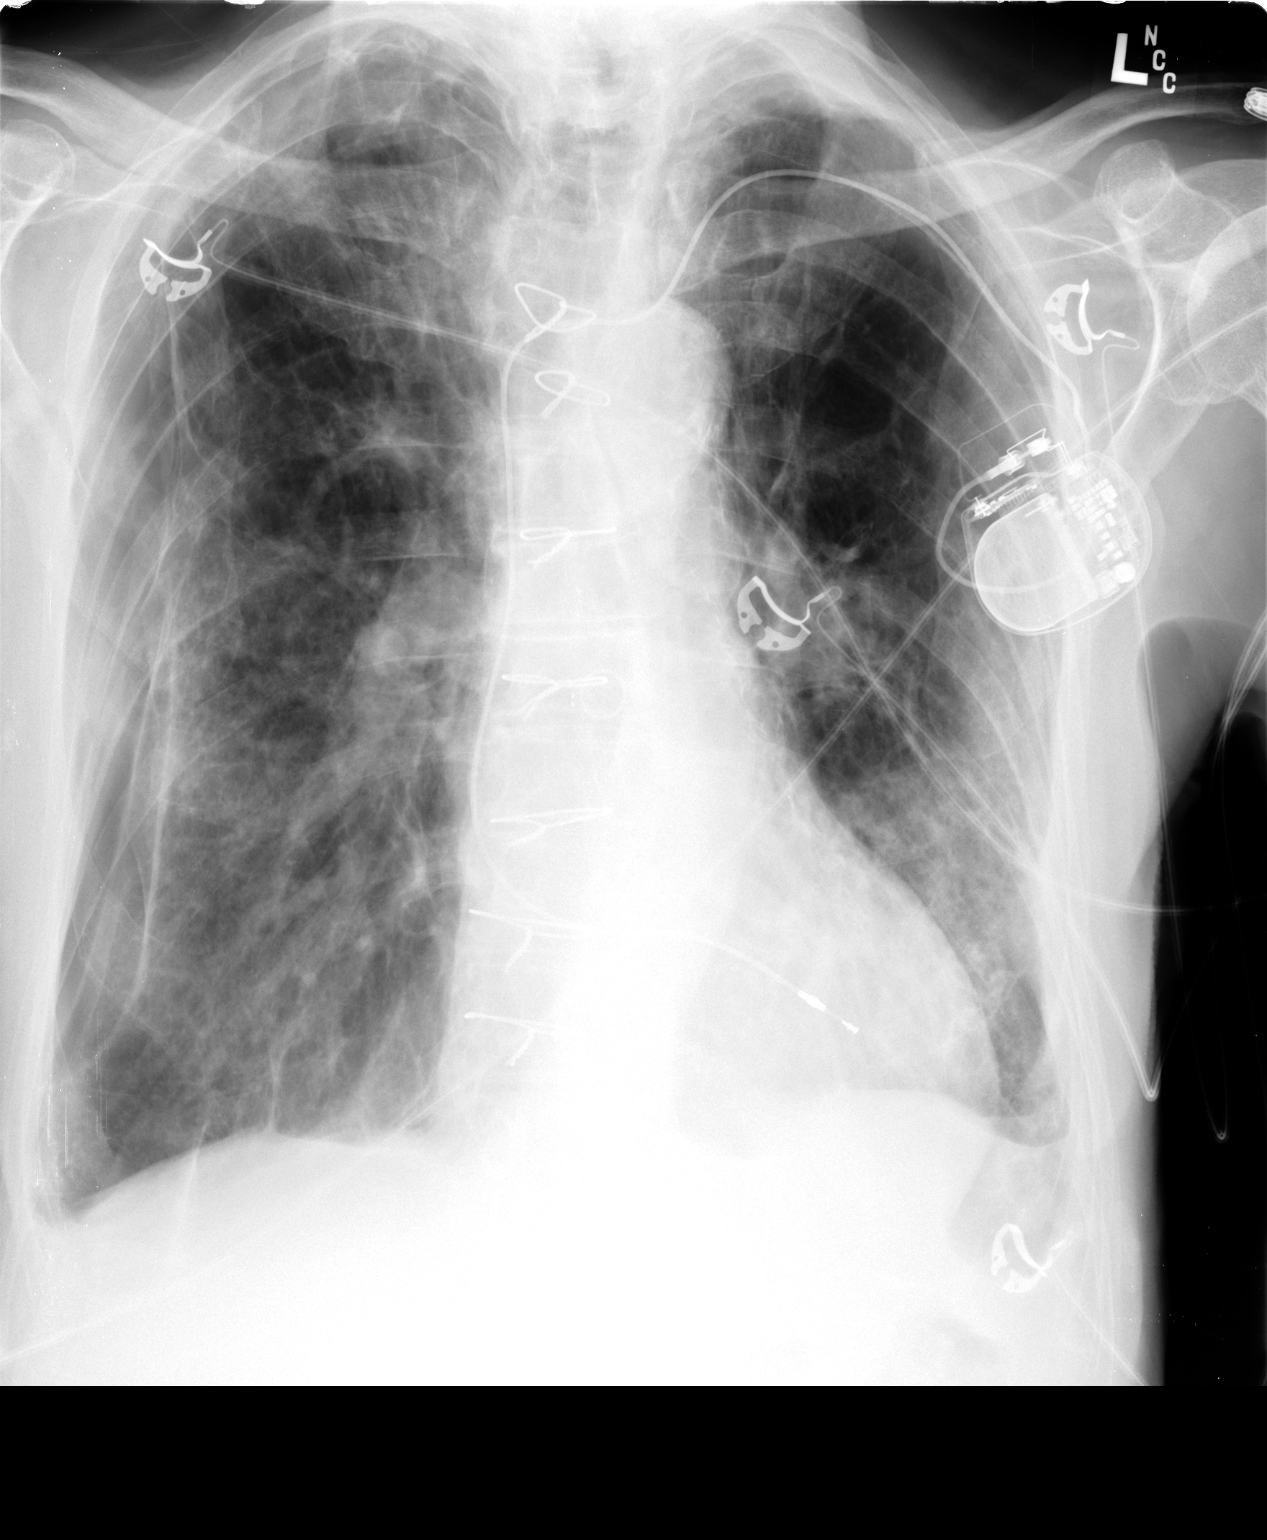

[1 of 1 positions shown; findings below may reference images not displayed]

FINDINGS: Stable residual lateral right pneumothorax.  Stable
underlying chronic lung disease.
IMPRESSION: Stable right lateral pneumothorax.

## 2011-10-13 IMAGING — CR DG CHEST 1V PORT
1 series · 1 of 1 positions shown · non-contrast
Comparison: 02/10/2011

CLINICAL DATA: Right chest tube placement.  Shortness of breath

PORTABLE CHEST - 1 VIEW

[view not recorded]
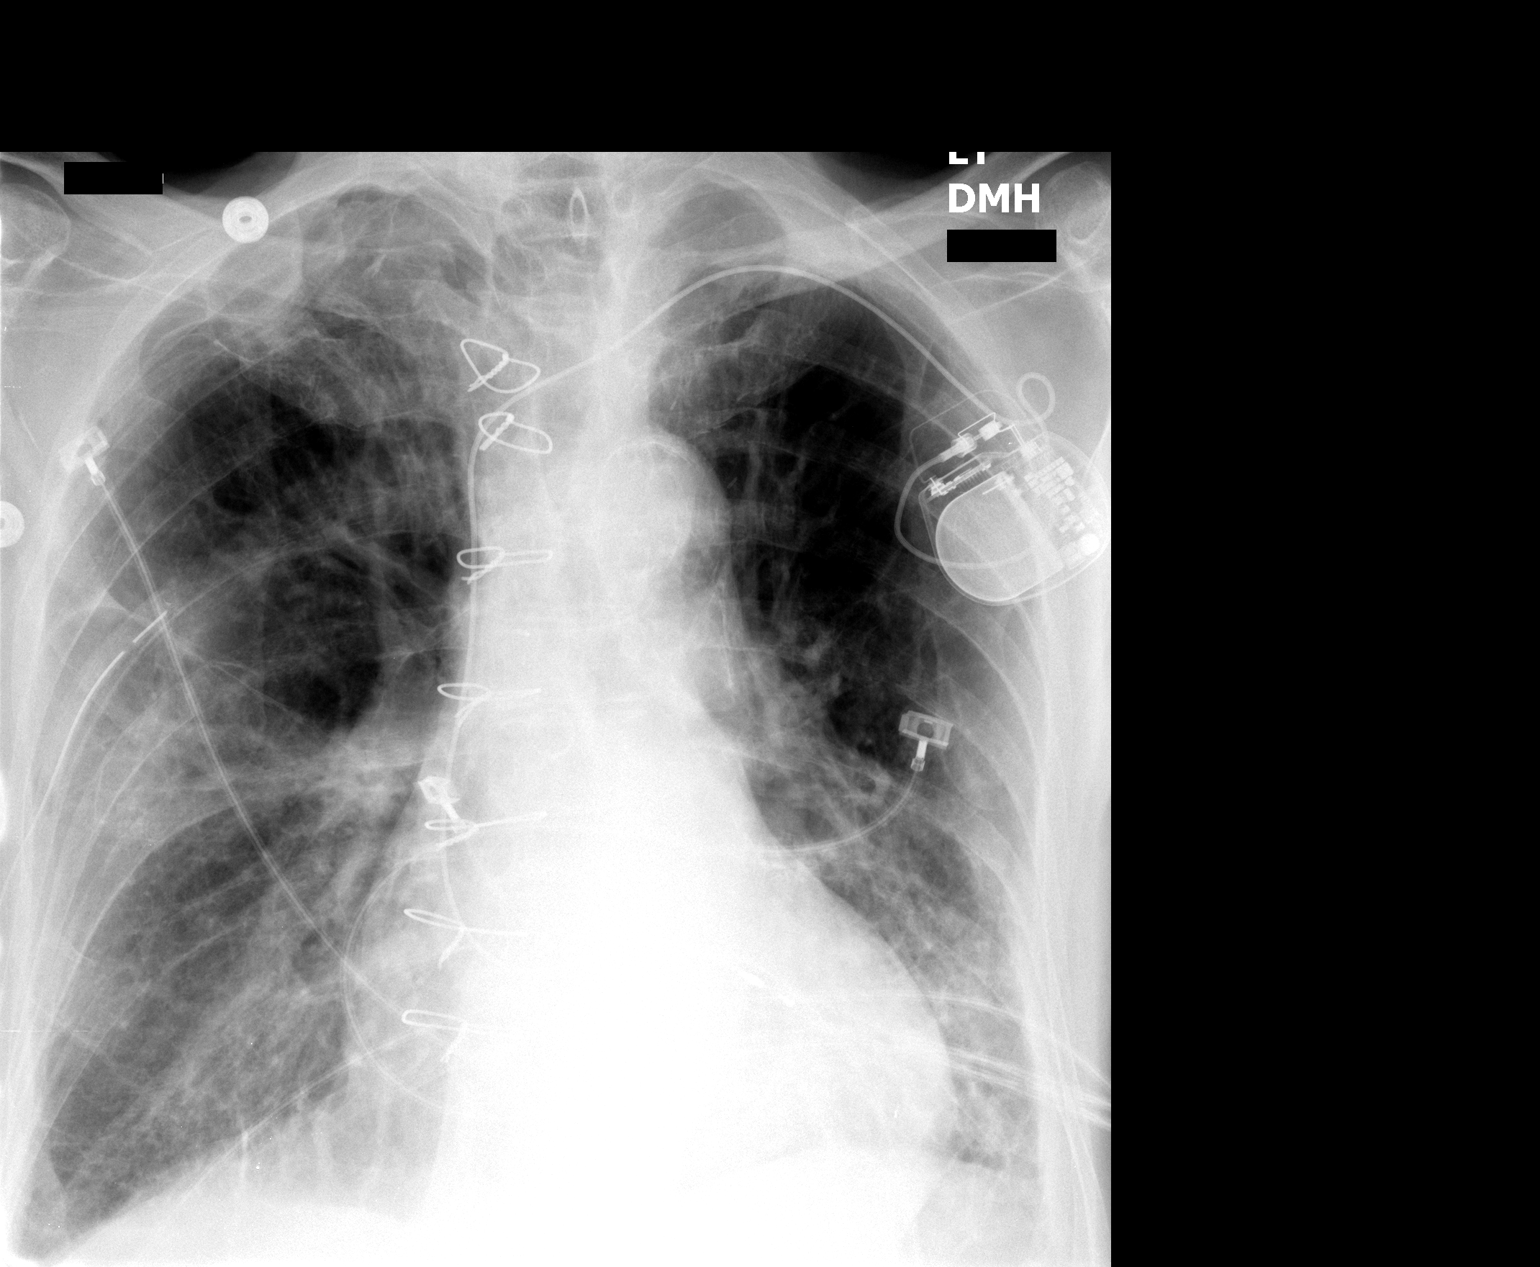

[1 of 1 positions shown; findings below may reference images not displayed]

FINDINGS: A pacer is in place via a left subclavian approach and
the position is stable.  A right chest tube remains unchanged in
position.  The patient is status post median sternotomy and
sternotomy wires appear intact.

Changes of underlying COPD with pronounced pleuroparenchymal
scarring are noted and unchanged in appearance. No evidence for
pneumothorax is seen.  No new areas of focal infiltrate or
atelectasis are noted and no signs of congestive failure noted.

Heart size is upper limits of normal with stable ectasia of the
thoracic aorta.
IMPRESSION: Stable cardiopulmonary appearance with unchanged chest tube
position and no signs of pneumothorax.

## 2011-10-15 IMAGING — CR DG CHEST 1V PORT
1 series · 1 of 1 positions shown · non-contrast
Comparison: February 13, 2011

CLINICAL DATA: Shortness of breath

PORTABLE CHEST - 1 VIEW

[view not recorded]
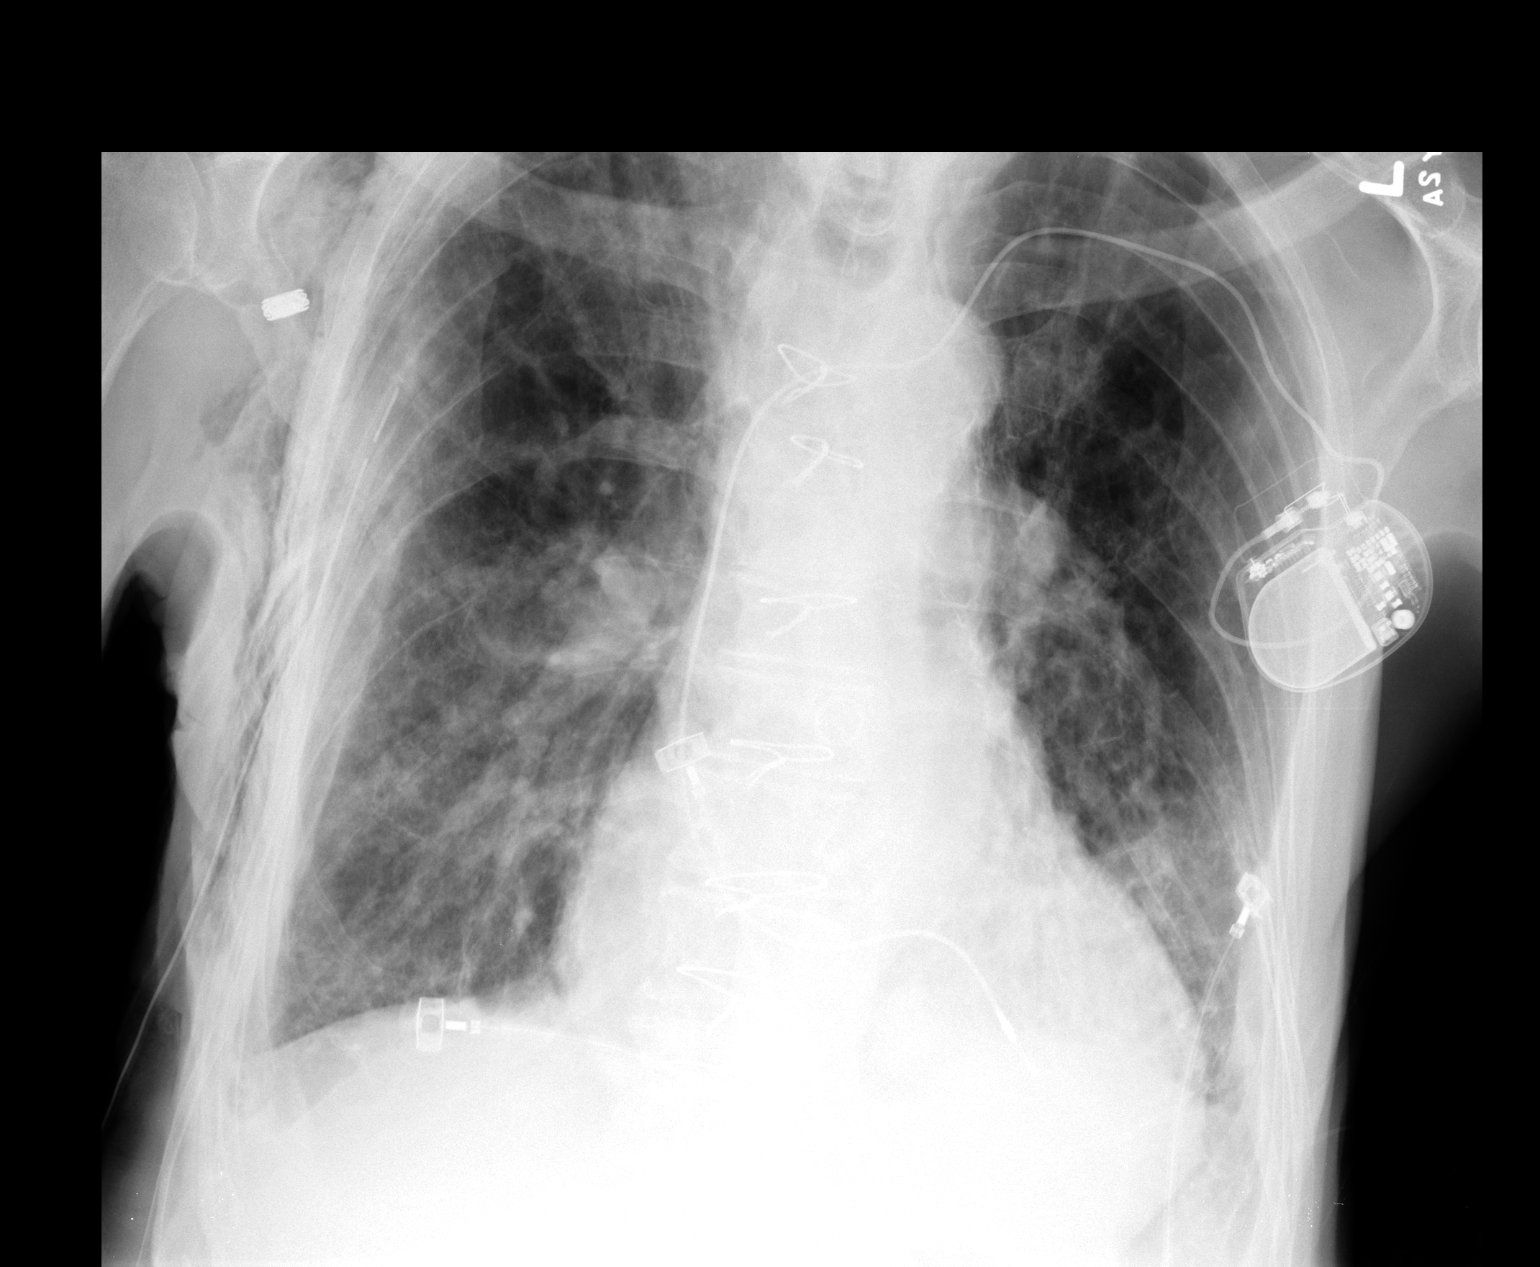

[1 of 1 positions shown; findings below may reference images not displayed]

FINDINGS: The right chest tube tip is stable in position.  There
is a minute right apical pneumothorax.  Subcutaneous emphysema on
the right is now present.  Chronic interstitial fibrotic changes
are noted.  Left pleural effusion has decreased.  Cardiomegaly is
stable.
IMPRESSION: Minute right apical pneumothorax.

## 2011-10-23 ENCOUNTER — Ambulatory Visit (INDEPENDENT_AMBULATORY_CARE_PROVIDER_SITE_OTHER): Payer: Medicare Other | Admitting: Ophthalmology

## 2011-10-23 DIAGNOSIS — H35039 Hypertensive retinopathy, unspecified eye: Secondary | ICD-10-CM

## 2011-10-23 DIAGNOSIS — H43819 Vitreous degeneration, unspecified eye: Secondary | ICD-10-CM

## 2011-10-23 DIAGNOSIS — H34219 Partial retinal artery occlusion, unspecified eye: Secondary | ICD-10-CM

## 2011-10-23 DIAGNOSIS — I10 Essential (primary) hypertension: Secondary | ICD-10-CM

## 2011-10-23 DIAGNOSIS — H3530 Unspecified macular degeneration: Secondary | ICD-10-CM

## 2011-10-23 DIAGNOSIS — H35379 Puckering of macula, unspecified eye: Secondary | ICD-10-CM

## 2011-11-28 ENCOUNTER — Telehealth: Payer: Self-pay | Admitting: Internal Medicine

## 2011-11-28 NOTE — Telephone Encounter (Signed)
11-28-11 re sent march recall/mt

## 2012-01-01 ENCOUNTER — Ambulatory Visit
Admission: RE | Admit: 2012-01-01 | Discharge: 2012-01-01 | Disposition: A | Discharge status: Home / Self Care | Payer: Medicare Other | Source: Ambulatory Visit | Attending: Cardiovascular Disease | Admitting: Cardiovascular Disease

## 2012-01-01 ENCOUNTER — Other Ambulatory Visit: Payer: Self-pay | Admitting: Cardiovascular Disease

## 2012-01-01 DIAGNOSIS — M25559 Pain in unspecified hip: Secondary | ICD-10-CM

## 2012-01-07 ENCOUNTER — Observation Stay (HOSPITAL_COMMUNITY)
Admission: EM | Admit: 2012-01-07 | Discharge: 2012-01-09 | Disposition: A | Discharge status: Home / Self Care | Payer: Medicare Other | Attending: Cardiovascular Disease | Admitting: Cardiovascular Disease

## 2012-01-07 ENCOUNTER — Emergency Department (HOSPITAL_COMMUNITY): Payer: Medicare Other

## 2012-01-07 ENCOUNTER — Encounter (HOSPITAL_COMMUNITY): Payer: Self-pay | Admitting: Adult Health

## 2012-01-07 DIAGNOSIS — Z8546 Personal history of malignant neoplasm of prostate: Secondary | ICD-10-CM | POA: Insufficient documentation

## 2012-01-07 DIAGNOSIS — I10 Essential (primary) hypertension: Secondary | ICD-10-CM | POA: Insufficient documentation

## 2012-01-07 DIAGNOSIS — R42 Dizziness and giddiness: Principal | ICD-10-CM | POA: Diagnosis present

## 2012-01-07 DIAGNOSIS — J449 Chronic obstructive pulmonary disease, unspecified: Secondary | ICD-10-CM | POA: Insufficient documentation

## 2012-01-07 DIAGNOSIS — I2510 Atherosclerotic heart disease of native coronary artery without angina pectoris: Secondary | ICD-10-CM | POA: Insufficient documentation

## 2012-01-07 DIAGNOSIS — J4489 Other specified chronic obstructive pulmonary disease: Secondary | ICD-10-CM | POA: Insufficient documentation

## 2012-01-07 DIAGNOSIS — Z950 Presence of cardiac pacemaker: Secondary | ICD-10-CM | POA: Insufficient documentation

## 2012-01-07 DIAGNOSIS — Z8673 Personal history of transient ischemic attack (TIA), and cerebral infarction without residual deficits: Secondary | ICD-10-CM | POA: Insufficient documentation

## 2012-01-07 DIAGNOSIS — I359 Nonrheumatic aortic valve disorder, unspecified: Secondary | ICD-10-CM | POA: Insufficient documentation

## 2012-01-07 HISTORY — DX: Presence of cardiac pacemaker: Z95.0

## 2012-01-07 HISTORY — DX: Heart failure, unspecified: I50.9

## 2012-01-07 HISTORY — DX: Pneumonia, unspecified organism: J18.9

## 2012-01-07 NOTE — ED Notes (Signed)
OLD AND NEW EKG GIVEN TO DR Hyacinth Meeker

## 2012-01-07 NOTE — ED Notes (Signed)
Patient transported to X-ray 

## 2012-01-07 NOTE — ED Provider Notes (Signed)
History     CSN: [id number]  Arrival date & time 01/07/12  2101   First MD Initiated Contact with Patient 01/07/12 2255      Chief Complaint  Patient presents with  . Dizziness    (Consider location/radiation/quality/duration/timing/severity/associated sxs/prior treatment) HPI Comments: While pt was getting ready for bed - while standing and putting on pajama bottoms - became acutely dizzy - described as a spinning feeling - when he sat down, it did not improve - he denies nausea or vomiting.  He does have hx of stroke in the past which was only numbness and has totally improved.  He states that the dizzyness lasted > 2 hours and only recently has improved.  Sx were severe, persistent and gradually resolved.  Had to walk holding on to the fence for balance to get to his car at home.  Denies CP, SOB, HA, visual change, no tinnitus and no change in hearing, no focal numbness or weakness.  No meds pta for the dizziness.  Has never happened to him before.  The history is provided by the patient and the spouse.    Past Medical History  Diagnosis Date  . COPD (chronic obstructive pulmonary disease)   . Prostate cancer 2003  . Stroke 2000  . CAD (coronary artery disease)   . AF (atrial fibrillation)   . HTN (hypertension)   . Pneumothorax, right   . Complete heart block     S/P pacemaker insertion// [employer]ent RFSR    Past Surgical History  Procedure Date  . Coronary artery bypass graft 2003  . Prostatectomy 2003  . Appendectomy   . Eye surgery     bilateral for gaze problem  . Pacemaker insertion     SJM by JA for CHB    Family History  Problem Relation Age of Onset  . Heart attack Father     died age 86  . Heart disease Mother   . Heart disease Sister   . Heart disease Brother   . Multiple sclerosis Mother     died age 30  . Kidney failure Brother     History  Substance Use Topics  . Smoking status: Current Everyday Smoker -- 2.0 packs/day for 60 years   Types: Cigarettes  . Smokeless tobacco: Not on file   Comment: pt down to 3-4 ciagrettes daily  . Alcohol Use: No      Review of Systems  All other systems reviewed and are negative.    Allergies  Review of patient's allergies indicates no known allergies.  Home Medications   Current Outpatient Rx  Name Route Sig Dispense Refill  . CALCIUM CARBONATE-VITAMIN D 250-125 MG-UNIT PO TABS Oral Take 1 tablet by mouth 2 (two) times daily.     Marland Kitchen LEVALBUTEROL TARTRATE 45 MCG/ACT IN AERO Inhalation Inhale 1-2 puffs into the lungs 4 (four) times daily as needed.      Marland Kitchen ONE-DAILY MULTI VITAMINS PO TABS Oral Take 1 tablet by mouth daily.      Marland Kitchen NITROGLYCERIN 0.4 MG SL SUBL Sublingual Place 0.4 mg under the tongue every 5 (five) minutes as needed. As directed     . TIOTROPIUM BROMIDE MONOHYDRATE 18 MCG IN CAPS Inhalation Place 1 capsule (18 mcg total) into inhaler and inhale daily. 30 capsule 11  . WARFARIN SODIUM 7.5 MG PO TABS Oral Take 7.5 mg by mouth daily.      BP 127/64  Pulse 55  Temp(Src) 97 F (36.1 C) (Oral)  Resp 20  SpO2 97%  Physical Exam  Nursing note and vitals reviewed. Constitutional: He appears well-developed and well-nourished. No distress.  HENT:  Head: Normocephalic and atraumatic.  Mouth/Throat: Oropharynx is clear and moist. No oropharyngeal exudate.  Eyes: Conjunctivae and EOM are normal. Pupils are equal, round, and reactive to light. Right eye exhibits no discharge. Left eye exhibits no discharge. No scleral icterus.  Neck: Normal range of motion. Neck supple. No JVD present. No thyromegaly present.  Cardiovascular: Normal rate, regular rhythm, normal heart sounds and intact distal pulses.  Exam reveals no gallop and no friction rub.   No murmur heard. Pulmonary/Chest: Effort normal and breath sounds normal. No respiratory distress. He has no wheezes. He has no rales.  Abdominal: Soft. Bowel sounds are normal. He exhibits no distension and no mass. There is no  tenderness.  Musculoskeletal: Normal range of motion. He exhibits no edema and no tenderness.  Lymphadenopathy:    He has no cervical adenopathy.  Neurological: He is alert. Coordination normal.       Normal strength and sensation of the bilateral upper and lower extremities, normal limits coordination without ataxia, normal finger-nose-finger, no pronator drift, normal extraocular movements and normal peripheral visual fields with normal cranial nerves III through XII. His speech is clear, his movements her purse ice, his gait is stable however after walking for approximately 5 seconds he develops recurrent vertigo. This requires him to sit down, the symptoms fatigue over the next 30-45 seconds. He can ambulate back to his bed in his vertigo returns. After another minute he is symptom-free. There is no nystagmus noted on exam.  Skin: Skin is warm and dry. No rash noted. No erythema.  Psychiatric: He has a normal mood and affect. His behavior is normal.    ED Course  Procedures (including critical care time)  ED ECG REPORT   Date: 01/08/2012   Rate: 61  Rhythm: Electronically paced rhythm  QRS Axis: right  Intervals: Widened QRS consistent with paced rhythm  ST/T Wave abnormalities: nonspecific ST/T changes - inferior ST depressions and V5,V6 depressions  Conduction Disutrbances:Paced rhythm  Narrative Interpretation:   Old EKG Reviewed: Compared with EKG from 04/10/2011, no significant changes. other than ST changes  ED ECG REPORT   Date: 01/08/2012   Rate: 63  Rhythm: Electronically paced  QRS Axis: indeterminate  Intervals: Widened QRS  ST/T Wave abnormalities: nonspecific ST/T changes  Conduction Disutrbances:Widened QRS  Narrative Interpretation:   Old EKG Reviewed: changes noted and ST depressions in inferior leads have essentially resolved    Labs Reviewed  CBC - Abnormal; Notable for the following:    Hemoglobin 12.8 (*)    HCT 38.9 (*)    All other components within  normal limits  DIFFERENTIAL - Abnormal; Notable for the following:    Monocytes Relative 15 (*)    Monocytes Absolute 1.1 (*)    All other components within normal limits  PROTIME-INR - Abnormal; Notable for the following:    Prothrombin Time 35.6 (*)    INR 3.49 (*)    All other components within normal limits  APTT - Abnormal; Notable for the following:    aPTT 49 (*)    All other components within normal limits  CK TOTAL AND CKMB - Abnormal; Notable for the following:    CK, MB 6.2 (*)    Relative Index 6.2 (*)    All other components within normal limits  GLUCOSE, CAPILLARY - Abnormal; Notable for the following:  Glucose-Capillary 127 (*)    All other components within normal limits  TROPONIN I  BASIC METABOLIC PANEL   Dg Chest 2 View  01/07/2012  *RADIOLOGY REPORT*  Clinical Data: Dizziness.  Shortness of breath.  COPD.  Coronary artery disease.  CHEST - 2 VIEW  Comparison: 09/26/2011  Findings: Severe COPD again demonstrated.  No evidence of acute infiltrate or pulmonary edema.  Pleural thickening left lung bases unchanged.  No evidence of pleural effusion.  Mild cardiomegaly stable and there is no evidence of congestive heart failure.  A single lead transvenous pacemaker remains in appropriate position.  Prior CABG again noted.  IMPRESSION: Stable cardiomegaly and severe COPD.  No acute findings.  Original Report Authenticated By: Danae Orleans, M.D.   Ct Head Wo Contrast  01/08/2012  *RADIOLOGY REPORT*  Clinical Data: Dizziness.  Shortness of breath.  Vertigo.  CT HEAD WITHOUT CONTRAST  Technique:  Contiguous axial images were obtained from the base of the skull through the vertex without contrast.  Comparison: Report from 11/25/2002  Findings: Abnormal hypodense lesion in the left occipital lobe noted with adjacent white matter hypodensity.  Although this could represent encephalomalacia, there may be a mantle of surrounding cortex and I cannot completely exclude a mass lesion  with associated vasogenic edema.  Small bilateral thalamic lacunar infarcts are present along with a lacunar infarct of the head of the right caudate nucleus.  There are potential small lacunar infarcts involving the lentiform nuclei.  Periventricular and corona radiata white matter hypodensities are most compatible with chronic ischemic microvascular white matter disease.  No intracranial hemorrhage or acute CVA is observed.  Polypoid mucoperiosteal thickening in the left maxillary sinus noted.  Atherosclerotic calcification of the carotid siphons is present. Incidental note is made of a cavum septi pellucidi and cavum vergae.  IMPRESSION:  1.  Abnormal lesion in the left occipital lobe probably represents an old stroke, but I cannot completely exclude the possibility of a mass with adjacent vasogenic edema.  If the patient does not have a history of old left PCA distribution stroke, then MRI of the brain with without contrast may be warranted. 2.  Remote lacunar infarcts in the basal ganglia and thalami. 3. Periventricular and corona radiata white matter hypodensities are most compatible with chronic ischemic microvascular white matter disease. 4.  Chronic left maxillary sinusitis.  Original Report Authenticated By: Dellia Cloud, M.D.     1. Vertigo       MDM  Patient has normal vital signs including blood pressure, pulse and his neurologic exam is consistent with a peripheral vertigo. He states that his symptoms initially lasted several hours ago after seeing his inducible and fatigable vertigo this evening with change in position I suspect that this is a peripheral source. He has never had this before and has no other neurologic deficits including visual fields, visual acuity or any other focal neurologic complaint. There is no limb ataxia, no truncal ataxia and no ocular vestibular dysfunction.  After further discussion with the patient, he states that he feels much better when in the supine  position and has had no chest pain or shortness of breath outside of his normal chronic COPD shortness of breath. His vital signs have remained stable, his repeat EKG shows resolution of the inferior EKG changes of ST depressions. I have discussed his care with the cardiologist Dr. Algie Coffer who is also his primary care Dr. and who has agreed to admit him to the hospital.  His CK-MB  was elevated at 6.2, troponin was normal, CK was normal and CBC was normal. Chest x-ray showed no signs of significant abnormalities and a CT scan of the head showed old infarcts and a possible mass. I suspect he'll need an MRI as part of his evaluation.      Vida Roller, MD 01/08/12 [phone]

## 2012-01-07 NOTE — ED Notes (Addendum)
Reports changing into PJs tonight at 2030 when he became dizzy, felt like the room was spinning and he was spinning. Denies LOC, nausea, chest pain. C/o SOB. Alert and oriented, answers all questions appriopriately. Wife states, his pulse was fast.  Reports dizziness is worse with position change

## 2012-01-07 NOTE — ED Notes (Signed)
CBG CHECKED BY EMT R [id number]

## 2012-01-08 ENCOUNTER — Emergency Department (HOSPITAL_COMMUNITY): Payer: Medicare Other

## 2012-01-08 ENCOUNTER — Encounter (HOSPITAL_COMMUNITY): Payer: Self-pay | Admitting: Radiology

## 2012-01-08 DIAGNOSIS — R42 Dizziness and giddiness: Secondary | ICD-10-CM | POA: Diagnosis present

## 2012-01-08 LAB — BASIC METABOLIC PANEL
Chloride: 107 mEq/L (ref 96–112)
GFR calc Af Amer: 69 mL/min — ABNORMAL LOW (ref >90)
GFR calc non Af Amer: 60 mL/min — ABNORMAL LOW (ref >90)
Glucose, Bld: 116 mg/dL — ABNORMAL HIGH (ref 70–99)
Potassium: 4.1 mEq/L (ref 3.5–5.1)
Sodium: 142 mEq/L (ref 135–145)

## 2012-01-08 LAB — TROPONIN I: Troponin I: <0.3 ng/mL (ref <0.30)

## 2012-01-08 LAB — DIFFERENTIAL
Eosinophils Absolute: 0.3 10*3/uL (ref 0.0–0.7)
Lymphs Abs: 2.5 10*3/uL (ref 0.7–4.0)
Neutro Abs: 3.2 10*3/uL (ref 1.7–7.7)
Neutrophils Relative %: 45 % (ref 43–77)

## 2012-01-08 LAB — CBC
MCH: 29.4 pg (ref 26.0–34.0)
Platelets: 189 10*3/uL (ref 150–400)
RBC: 4.36 MIL/uL (ref 4.22–5.81)
WBC: 7.2 10*3/uL (ref 4.0–10.5)

## 2012-01-08 LAB — APTT: aPTT: 49 seconds — ABNORMAL HIGH (ref 24–37)

## 2012-01-08 LAB — PROTIME-INR: INR: 3.49 — ABNORMAL HIGH (ref 0.00–1.49)

## 2012-01-08 LAB — CARDIAC PANEL(CRET KIN+CKTOT+MB+TROPI): Relative Index: INVALID (ref 0.0–2.5)

## 2012-01-08 MED ORDER — HYDROCODONE-ACETAMINOPHEN 5-325 MG PO TABS: 1.0000 | ORAL_TABLET | ORAL | Status: DC | PRN

## 2012-01-08 MED ORDER — ONDANSETRON HCL 4 MG PO TABS: 4.0000 mg | ORAL_TABLET | Freq: Four times a day (QID) | ORAL | Status: DC | PRN

## 2012-01-08 MED ORDER — TIOTROPIUM BROMIDE MONOHYDRATE 18 MCG IN CAPS
18.0000 ug | ORAL_CAPSULE | Freq: Every day | RESPIRATORY_TRACT | Status: DC
  Administered 2012-01-08 – 2012-01-09 (×2): 18 ug via RESPIRATORY_TRACT
  Filled 2012-01-08: qty 5

## 2012-01-08 MED ORDER — SODIUM CHLORIDE 0.9 % IJ SOLN: 3.0000 mL | INTRAMUSCULAR | Status: DC | PRN

## 2012-01-08 MED ORDER — LEVALBUTEROL TARTRATE 45 MCG/ACT IN AERO: 2.0000 | INHALATION_SPRAY | Freq: Four times a day (QID) | RESPIRATORY_TRACT | Status: DC | PRN

## 2012-01-08 MED ORDER — SODIUM CHLORIDE 0.9 % IV SOLN: 250.0000 mL | INTRAVENOUS | Status: DC | PRN

## 2012-01-08 MED ORDER — SODIUM CHLORIDE 0.9 % IJ SOLN: 3.0000 mL | Freq: Two times a day (BID) | INTRAMUSCULAR | Status: DC

## 2012-01-08 MED ORDER — DOCUSATE SODIUM 100 MG PO CAPS
100.0000 mg | ORAL_CAPSULE | Freq: Two times a day (BID) | ORAL | Status: DC
  Administered 2012-01-08: 100 mg via ORAL
  Filled 2012-01-08 (×4): qty 1

## 2012-01-08 MED ORDER — ACETAMINOPHEN 650 MG RE SUPP: 650.0000 mg | Freq: Four times a day (QID) | RECTAL | Status: DC | PRN

## 2012-01-08 MED ORDER — SODIUM CHLORIDE 0.9 % IJ SOLN
3.0000 mL | Freq: Two times a day (BID) | INTRAMUSCULAR | Status: DC
  Administered 2012-01-08: 3 mL via INTRAVENOUS

## 2012-01-08 MED ORDER — ALUM & MAG HYDROXIDE-SIMETH 200-200-20 MG/5ML PO SUSP
30.0000 mL | Freq: Four times a day (QID) | ORAL | Status: DC | PRN
Start: 2012-01-08 — End: 2012-01-09

## 2012-01-08 MED ORDER — ONDANSETRON HCL 4 MG/2ML IJ SOLN: 4.0000 mg | Freq: Four times a day (QID) | INTRAMUSCULAR | Status: DC | PRN

## 2012-01-08 MED ORDER — ACETAMINOPHEN 325 MG PO TABS: 650.0000 mg | ORAL_TABLET | Freq: Four times a day (QID) | ORAL | Status: DC | PRN

## 2012-01-08 NOTE — ED Notes (Signed)
Pt bent down to pick up object and did not feel dizzy.  Pt also stood up and sat in chair and did not feel dizzy.  Denies pain.

## 2012-01-08 NOTE — ED Notes (Signed)
Pt ao x 4.  Denies dizziness, sob. Wife at bedside.

## 2012-01-08 NOTE — ED Notes (Signed)
Dr Darryl Nestle at bedside.

## 2012-01-08 NOTE — Progress Notes (Signed)
*  PRELIMINARY RESULTS* Echocardiogram 2D Echocardiogram has been performed.  [name] R 01/08/2012, 10:20 AM

## 2012-01-08 NOTE — Progress Notes (Signed)
Utilization review complete 

## 2012-01-08 NOTE — Progress Notes (Signed)
Pt on RA 97% resting; ambulated in hallway and sats dropped to 88% on RA, did not drop below, but rebounded quickly to the 90s;  pts only was that he felt weak and tired; pt currently sitting in room eating; will continue to monitor

## 2012-01-08 NOTE — ED Notes (Signed)
Paged Dr. Kadakia to [phone] 

## 2012-01-08 NOTE — Progress Notes (Addendum)
   CARE MANAGEMENT NOTE 01/08/2012  Patient:  [name], [name]   Account Number:  [id number]  Date Initiated:  01/08/2012  Documentation initiated by:  [facility]  Subjective/Objective Assessment:   COPD, CAD     Action/Plan:   lives at home with wife   Anticipated DC Date:  01/09/2012   Anticipated DC Plan:  HOME/SELF CARE      DC Planning Services  CM consult      Choice offered to / List presented to:             Status of service:  In process, will continue to follow Medicare Important Message given?   (If response is "NO", the following Medicare IM given date fields will be blank) Date Medicare IM given:   Date Additional Medicare IM given:    Discharge Disposition:    Per UR Regulation:  Reviewed for med. necessity/level of care/duration of stay  If discussed at Long Length of Stay Meetings, dates discussed:    Comments:  01/08/2012 1120 Spoke to pt and states he does well with ambulating. He notes getting up and working for 10 mins and resting for 15 mins. Experience shortness of breath when he is up ambulating at home. Wife states he had oxygen in the past but used for a short period. At 0315, oxygen level dropped to 88%, spoke to Unit RN and she will follow up with MD. Explained to pt that NCM will assist in getting oxygen for home if it is needed. NCM will continue to follow until d/c. Isidoro Donning RN CCM Case Mgmt phone [phone]

## 2012-01-08 NOTE — H&P (Signed)
[name] is an 78 y.o. male.   Chief Complaint: Dizziness HPI: 78 years old white male experienced sudden onset of dizziness without fever, cough or palpitation or gastro-intestinal bleed.  Past Medical History  Diagnosis Date  . COPD (chronic obstructive pulmonary disease)   . Prostate cancer 2003  . Stroke 2000  . CAD (coronary artery disease)   . AF (atrial fibrillation)   . HTN (hypertension)   . Pneumothorax, right   . Complete heart block     S/P pacemaker insertion// [employer] Jude [employer]ent RFSR      Past Surgical History  Procedure Date  . Coronary artery bypass graft 2003  . Prostatectomy 2003  . Appendectomy   . Eye surgery     bilateral for gaze problem  . Pacemaker insertion     SJM by JA for CHB    Family History  Problem Relation Age of Onset  . Heart attack Father     died age 60  . Heart disease Mother   . Heart disease Sister   . Heart disease Brother   . Multiple sclerosis Mother     died age 92  . Kidney failure Brother    Social History:  reports that he has been smoking Cigarettes.  He has a 120 pack-year smoking history. He does not have any smokeless tobacco history on file. He reports that he does not drink alcohol or use illicit drugs.  Allergies: No Known Allergies   (Not in a hospital admission)  Results for orders placed during the hospital encounter of 01/07/12 (from the past 48 hour(s))  PROTIME-INR     Status: Abnormal   Collection Time   01/07/12 11:11 PM      Component Value Range Comment   Prothrombin Time 35.6 (*) 11.6 - 15.2 (seconds)    INR 3.49 (*) 0.00 - 1.49    APTT     Status: Abnormal   Collection Time   01/07/12 11:11 PM      Component Value Range Comment   aPTT 49 (*) 24 - 37 (seconds)   CK TOTAL AND CKMB     Status: Abnormal   Collection Time   01/07/12 11:13 PM      Component Value Range Comment   Total CK 100  7 - 232 (U/L)    CK, MB 6.2 (*) 0.3 - 4.0 (ng/mL)    Relative Index 6.2 (*) 0.0 - 2.5    TROPONIN I      Status: Normal   Collection Time   01/07/12 11:13 PM      Component Value Range Comment   Troponin I <0.30  <0.30 (ng/mL)   GLUCOSE, CAPILLARY     Status: Abnormal   Collection Time   01/07/12 11:48 PM      Component Value Range Comment   Glucose-Capillary 127 (*) 70 - 99 (mg/dL)   CBC     Status: Abnormal   Collection Time   01/08/12  1:25 AM      Component Value Range Comment   WBC 7.2  4.0 - 10.5 (K/uL)    RBC 4.36  4.22 - 5.81 (MIL/uL)    Hemoglobin 12.8 (*) 13.0 - 17.0 (g/dL)    HCT 16.1 (*) 09.6 - 52.0 (%)    MCV 89.2  78.0 - 100.0 (fL)    MCH 29.4  26.0 - 34.0 (pg)    MCHC 32.9  30.0 - 36.0 (g/dL)    RDW 04.5  40.9 - 81.1 (%)  Platelets 189  150 - 400 (K/uL)   DIFFERENTIAL     Status: Abnormal   Collection Time   01/08/12  1:25 AM      Component Value Range Comment   Neutrophils Relative 45  43 - 77 (%)    Neutro Abs 3.2  1.7 - 7.7 (K/uL)    Lymphocytes Relative 35  12 - 46 (%)    Lymphs Abs 2.5  0.7 - 4.0 (K/uL)    Monocytes Relative 15 (*) 3 - 12 (%)    Monocytes Absolute 1.1 (*) 0.1 - 1.0 (K/uL)    Eosinophils Relative 5  0 - 5 (%)    Eosinophils Absolute 0.3  0.0 - 0.7 (K/uL)    Basophils Relative 0  0 - 1 (%)    Basophils Absolute 0.0  0.0 - 0.1 (K/uL)   BASIC METABOLIC PANEL     Status: Abnormal   Collection Time   01/08/12  1:25 AM      Component Value Range Comment   Sodium 142  135 - 145 (mEq/L)    Potassium 4.1  3.5 - 5.1 (mEq/L)    Chloride 107  96 - 112 (mEq/L)    CO2 28  19 - 32 (mEq/L)    Glucose, Bld 116 (*) 70 - 99 (mg/dL)    BUN 26 (*) 6 - 23 (mg/dL)    Creatinine, Ser 4.09  0.50 - 1.35 (mg/dL)    Calcium 8.8  8.4 - 10.5 (mg/dL)    GFR calc non Af Amer 60 (*) >90 (mL/min)    GFR calc Af Amer 69 (*) >90 (mL/min)    Dg Chest 2 View  01/07/2012  *RADIOLOGY REPORT*  Clinical Data: Dizziness.  Shortness of breath.  COPD.  Coronary artery disease.  CHEST - 2 VIEW  Comparison: 09/26/2011  Findings: Severe COPD again demonstrated.  No evidence of  acute infiltrate or pulmonary edema.  Pleural thickening left lung bases unchanged.  No evidence of pleural effusion.  Mild cardiomegaly stable and there is no evidence of congestive heart failure.  A single lead transvenous pacemaker remains in appropriate position.  Prior CABG again noted.  IMPRESSION: Stable cardiomegaly and severe COPD.  No acute findings.  Original Report Authenticated By: Danae Orleans, M.D.   Ct Head Wo Contrast  01/08/2012  *RADIOLOGY REPORT*  Clinical Data: Dizziness.  Shortness of breath.  Vertigo.  CT HEAD WITHOUT CONTRAST  Technique:  Contiguous axial images were obtained from the base of the skull through the vertex without contrast.  Comparison: Report from 11/25/2002  Findings: Abnormal hypodense lesion in the left occipital lobe noted with adjacent white matter hypodensity.  Although this could represent encephalomalacia, there may be a mantle of surrounding cortex and I cannot completely exclude a mass lesion with associated vasogenic edema.  Small bilateral thalamic lacunar infarcts are present along with a lacunar infarct of the head of the right caudate nucleus.  There are potential small lacunar infarcts involving the lentiform nuclei.  Periventricular and corona radiata white matter hypodensities are most compatible with chronic ischemic microvascular white matter disease.  No intracranial hemorrhage or acute CVA is observed.  Polypoid mucoperiosteal thickening in the left maxillary sinus noted.  Atherosclerotic calcification of the carotid siphons is present. Incidental note is made of a cavum septi pellucidi and cavum vergae.  IMPRESSION:  1.  Abnormal lesion in the left occipital lobe probably represents an old stroke, but I cannot completely exclude the possibility of a mass with adjacent  vasogenic edema.  If the patient does not have a history of old left PCA distribution stroke, then MRI of the brain with without contrast may be warranted. 2.  Remote lacunar infarcts  in the basal ganglia and thalami. 3. Periventricular and corona radiata white matter hypodensities are most compatible with chronic ischemic microvascular white matter disease. 4.  Chronic left maxillary sinusitis.  Original Report Authenticated By: Dellia Cloud, M.D.    @ROS @  Blood pressure 127/64, pulse 55, temperature 97 F (36.1 C), temperature source Oral, resp. rate 20, SpO2 97.00%. HEENT: [place]/AT, blue eyes, Conjunctiva pink.  NECK: Supple. No JVD.  LUNGS: Decreased breath sounds on left base along without rhonchi and rales.  HEART: Irregularly irregular rhythm. Soft systolic murmur.  ABDOMEN: Soft. Bowel sounds present, nontender.  EXTREMITIES: No edema, cyanosis, or clubbing. CNS:- Grossly intact. Romberg negative. No positional vertigo   Assessment/Plan Dizziness COPD HTN A. Fibrillation CAD S/P pacemaker  Observation Home medications Echo  Nyquan Selbe S 01/08/2012, 2:33 AM

## 2012-01-08 NOTE — ED Notes (Signed)
Called lab.  Stated ck, ckmb in process, however ,they state they never rcvd blood for cbc and cmet.

## 2012-01-09 LAB — CBC
HCT: 44.3 % (ref 39.0–52.0)
Hemoglobin: 14.2 g/dL (ref 13.0–17.0)
RBC: 4.92 MIL/uL (ref 4.22–5.81)
WBC: 6.8 10*3/uL (ref 4.0–10.5)

## 2012-01-09 LAB — PROTIME-INR: INR: 3.22 — ABNORMAL HIGH (ref 0.00–1.49)

## 2012-01-09 LAB — BASIC METABOLIC PANEL
BUN: 19 mg/dL (ref 6–23)
Chloride: 105 mEq/L (ref 96–112)
GFR calc Af Amer: 77 mL/min — ABNORMAL LOW (ref >90)
Glucose, Bld: 90 mg/dL (ref 70–99)
Potassium: 4.9 mEq/L (ref 3.5–5.1)

## 2012-01-09 NOTE — Discharge Instructions (Signed)

## 2012-01-09 NOTE — Progress Notes (Signed)
Came to visit pt. Pt is already discharged.

## 2012-01-09 NOTE — Discharge Summary (Signed)
Physician Discharge Summary  Patient ID: [name] MRN: [id number] DOB/AGE: 01-15-1931 78 y.o.  Admit date: 01/07/2012 Discharge date: 01/09/2012  Admission Diagnoses: Dizziness  COPD  HTN  A. Fibrillation  CAD  S/P pacemaker  Discharge Diagnoses:  Principal Problem:  *Dizziness Aortic Stenosis COPD  HTN  A. Fibrillation  CAD  S/P pacemaker   Discharged Condition: good  Hospital Course: 78 years old white male with multiple medical conditions and end stage COPD had dizziness. He has moderate aortic stenosis with LVH. His ICD-pacemaker is working well. He was advised to use walker all the time and avoid strenuous activity.  Consults: cardiology  Significant Diagnostic Studies: labs: Normal CBC and BMET. Borderline CK/MB without elevation of total CK. Cardiac graphics: Echocardiogram: Moderate AS, Mild MR, Mild LV systolic dysfunction. EF 45-50 %.  Treatments: Telemetry, Echocardiogram and PCD interrogation.  Discharge Exam: Blood pressure 156/81, pulse 64, temperature 98.4 F (36.9 C), temperature source Oral, resp. rate 20, height 6\' 1"  (1.854 m), weight 76.204 kg (168 lb), SpO2 97.00%. HEENT: [place]/AT, blue eyes, Conjunctiva pink. Sclera-non-icteric. NECK: Supple. No JVD.  LUNGS: Decreased breath sounds on left base along without rhonchi and rales.  HEART: Irregularly irregular rhythm. Soft systolic murmur.  ABDOMEN: Soft. Bowel sounds present, nontender.  EXTREMITIES: No edema, cyanosis, or clubbing.  CNS:- Grossly intact. Romberg negative. No positional vertigo Skin:-Warm and dry.  Disposition: 01-Home or Self Care  Discharge Orders    Future Appointments: Provider: Department: Dept Phone: Center:   03/02/2012 9:00 AM Hillis Range, MD [facility] [phone] [id number]   04/22/2012 9:30 AM Sherrie George, MD [facility] [phone] None     Medication List  As of 01/09/2012  2:19 PM   TAKE these medications         CALCIUM-VITAMIN D PO   Take 1 tablet by mouth daily.      carvedilol 3.125 MG tablet   Commonly known as: COREG   Take 3.125 mg by mouth 2 (two) times daily with a meal.      cyclobenzaprine 10 MG tablet   Commonly known as: FLEXERIL   Take 5 mg by mouth at bedtime.      diclofenac 50 MG EC tablet   Commonly known as: VOLTAREN   Take 50 mg by mouth 2 (two) times daily.      furosemide 20 MG tablet   Commonly known as: LASIX   Take 20 mg by mouth daily.      losartan 50 MG tablet   Commonly known as: COZAAR   Take 50 mg by mouth daily.      mulitivitamin with minerals Tabs   Take 1 tablet by mouth daily.      nitroGLYCERIN 0.4 MG SL tablet   Commonly known as: NITROSTAT   Place 0.4 mg under the tongue every 5 (five) minutes as needed. For chest pain      pantoprazole 40 MG tablet   Commonly known as: PROTONIX   Take 40 mg by mouth daily.      simvastatin 10 MG tablet   Commonly known as: ZOCOR   Take 10 mg by mouth at bedtime.      tiotropium 18 MCG inhalation capsule   Commonly known as: SPIRIVA   Place 18 mcg into inhaler and inhale daily.      warfarin 7.5 MG tablet   Commonly known as: COUMADIN   Take 3.75-7.5 mg by mouth daily. Sundays-0.5 tablet  All other days-1 tablet  SignedOrpah Cobb S 01/09/2012, 2:19 PM

## 2012-03-02 ENCOUNTER — Encounter: Payer: Self-pay | Admitting: Internal Medicine

## 2012-03-02 ENCOUNTER — Ambulatory Visit (INDEPENDENT_AMBULATORY_CARE_PROVIDER_SITE_OTHER): Payer: Medicare Other | Admitting: Internal Medicine

## 2012-03-02 VITALS — BP 140/70 | HR 86 | Resp 18 | Ht 74.0 in | Wt 166.8 lb

## 2012-03-02 DIAGNOSIS — F17200 Nicotine dependence, unspecified, uncomplicated: Secondary | ICD-10-CM

## 2012-03-02 DIAGNOSIS — I4891 Unspecified atrial fibrillation: Secondary | ICD-10-CM

## 2012-03-02 DIAGNOSIS — I442 Atrioventricular block, complete: Secondary | ICD-10-CM

## 2012-03-02 DIAGNOSIS — Z720 Tobacco use: Secondary | ICD-10-CM | POA: Insufficient documentation

## 2012-03-02 LAB — PACEMAKER DEVICE OBSERVATION
BATTERY VOLTAGE: 2.993 V
BMOD-0002RV: 12
BRDY-0004RV: 120 {beats}/min
DEVICE MODEL PM: 7084939
VENTRICULAR PACING PM: 99

## 2012-03-02 NOTE — Patient Instructions (Addendum)
Remote monitoring is used to monitor your Pacemaker of ICD from home. This monitoring reduces the number of office visits required to check your device to one time per year. It allows [place] to keep an eye on the functioning of your device to ensure it is working properly. You are scheduled for a device check from home on 06/08/2012. You may send your transmission at any time that day. If you have a wireless device, the transmission will be sent automatically. After your physician reviews your transmission, you will receive a postcard with your next transmission date.  Your physician wants you to follow-up in: 1 year with Dr. Johney Frame. You will receive a reminder letter in the mail two months in advance. If you don't receive a letter, please call our office to schedule the follow-up appointment.

## 2012-03-02 NOTE — Assessment & Plan Note (Signed)
Smoking cessation is discussed at length (>3 minutes).  He is not ready to quit

## 2012-03-02 NOTE — Assessment & Plan Note (Signed)
Rate controlled Continue coumadin (followed by Dr Algie Coffer)

## 2012-03-02 NOTE — Assessment & Plan Note (Signed)
Normal pacemaker function See Pace Art report No changes today 100% V paced  Merlin checks every 3 months Return in 1 year

## 2012-03-02 NOTE — Progress Notes (Signed)
Primary Cardiologist:  Dr Mont Dutton is a 78 y.o. male who presents today for routine electrophysiology followup.  Since last being seen in our clinic, the patient reports doing very well.  Today, he denies symptoms of palpitations, chest pain, shortness of breath,  lower extremity edema, presyncope, or syncope.  He has chronic unsteadiness which he describes as "dizziness".  The patient is otherwise without complaint today.   Past Medical History  Diagnosis Date  . COPD (chronic obstructive pulmonary disease)   . Prostate cancer 2003  . Stroke 2000  . CAD (coronary artery disease)   . AF (atrial fibrillation)     permanent  . HTN (hypertension)   . Pneumothorax, right   . Complete heart block     S/P pacemaker insertion// St. Jude Accent RFSR  . Pacemaker   . CHF (congestive heart failure)   . Pneumonia     hx of on right   . Tobacco abuse    Past Surgical History  Procedure Date  . Coronary artery bypass graft 2003  . Prostatectomy 2003  . Appendectomy   . Eye surgery     bilateral for gaze problem  . Pacemaker insertion 11/06/10    St jude pacemaker implanted by Dr Johney Frame for complete heart block  . Cholecystectomy     Current Outpatient Prescriptions  Medication Sig Dispense Refill  . atorvastatin (LIPITOR) 20 MG tablet Take 20 mg by mouth daily.      Marland Kitchen CALCIUM-VITAMIN D PO Take 1 tablet by mouth daily.      . carvedilol (COREG) 3.125 MG tablet Take 3.125 mg by mouth 2 (two) times daily with a meal.      . diclofenac (VOLTAREN) 50 MG EC tablet Take 50 mg by mouth 2 (two) times daily.      . furosemide (LASIX) 20 MG tablet Take 20 mg by mouth daily.      Marland Kitchen lisinopril (PRINIVIL,ZESTRIL) 5 MG tablet Take 5 mg by mouth daily.      Marland Kitchen losartan (COZAAR) 50 MG tablet Take 50 mg by mouth daily.      . Multiple Vitamin (MULITIVITAMIN WITH MINERALS) TABS Take 1 tablet by mouth daily.      . nitroGLYCERIN (NITROSTAT) 0.4 MG SL tablet Place 0.4 mg under the tongue every  5 (five) minutes as needed. For chest pain      . pantoprazole (PROTONIX) 40 MG tablet Take 40 mg by mouth daily.      Marland Kitchen tiotropium (SPIRIVA) 18 MCG inhalation capsule Place 18 mcg into inhaler and inhale daily.      Marland Kitchen warfarin (COUMADIN) 7.5 MG tablet Take 3.75-7.5 mg by mouth daily. Sundays-0.5 tablet All other days-1 tablet      . cyclobenzaprine (FLEXERIL) 10 MG tablet Take 5 mg by mouth at bedtime.        Physical Exam: Filed Vitals:   03/02/12 0915  BP: 140/70  Pulse: 86  Resp: 18  Height: 6\' 2"  (1.88 m)  Weight: 166 lb 12.8 oz (75.66 kg)  SpO2: 96%    GEN- The patient is elderly appearing, alert and oriented x 3 today.   Head- normocephalic, atraumatic Eyes-  Sclera clear, conjunctiva pink Ears- hearing intact Oropharynx- clear Lungs- Clear to ausculation bilaterally with a prolonged expiratory phase, normal work of breathing Chest- pacemaker pocket is well healed Heart- Regular rate and rhythm (paced) GI- soft, NT, ND, + BS Extremities- no clubbing, cyanosis, or edema  Pacemaker interrogation- reviewed in detail  today,  See PACEART report  Assessment and Plan:

## 2012-03-25 ENCOUNTER — Encounter: Payer: Self-pay | Admitting: Emergency Medicine

## 2012-03-25 ENCOUNTER — Ambulatory Visit (INDEPENDENT_AMBULATORY_CARE_PROVIDER_SITE_OTHER): Payer: Medicare Other | Admitting: Emergency Medicine

## 2012-03-25 VITALS — BP 130/72 | HR 61 | Temp 97.8°F | Ht 73.0 in | Wt 166.4 lb

## 2012-03-25 DIAGNOSIS — J449 Chronic obstructive pulmonary disease, unspecified: Secondary | ICD-10-CM

## 2012-03-25 MED ORDER — TIOTROPIUM BROMIDE MONOHYDRATE 18 MCG IN CAPS: 18.0000 ug | ORAL_CAPSULE | Freq: Every day | RESPIRATORY_TRACT | Status: AC

## 2012-03-25 NOTE — Assessment & Plan Note (Signed)
Some overall decline since last time, but I do not believe this is due to his COPD.  - refill spiriva - encouraged smoking cessation - rov 3 mon

## 2012-03-25 NOTE — Patient Instructions (Addendum)
Please continue your Spiriva daily Work on stopping smoking! Follow with Dr Delton Coombes in 3 months or sooner if you have any problems.

## 2012-03-25 NOTE — Progress Notes (Signed)
  Subjective:    Patient ID: [name], male    DOB: 04/04/1931, 78 y.o.   MRN: [id number] HPI 78 yo man with hx COPD, active smoker,  followed by Dr Algie Coffer. Also with CAD, A Fib. Seen by pulmonary for Pneumothorax 01/2011   [facility]/11 [facility]   He was admitted and treated for spontaneous R PTX. Discharged with resolution of PTX, but only on prn xopenex as his BD. He has remained SOB, not using the xopenex. Still not near prior baseline (before PTX).   02/21/11 Post Hospital  Readmitted for for recurrent right pnuemothorax. He required a chest tube. CXR prior to discharged with improving PTX.   Today chest xray shows resolved PTX.  Since discharge he feels he is better with less dyspnea and right sided pain.  He has not quit smoking we discussed smoking cesstation.   ROV 03/05/11 - follows up for severe COPD and R PTX. He was readmitted as above for recurrence, now s/p another chest tube. Using albuterol prn about twice a day, xopenex prn. Just started Spiriva on 6/28 - he thinks it is helping. Continues to smoke 4 -5 a day, wants to try to stop. Was d/c to home on O2. CXR today with small residual R PTX, no change from 6/27 but more noticeable than 6/22 discharge. His breathing has not decompensated as in the past when he had recurrent ptx.   ROV 03/29/11 -- severe COPD and R PTX. Follows up after PFT 7/25 - FEV1 40% predicted. Currently on Spiriva + SABA. Continues to have his baseline SOB. He ran out of Spiriva and hasn't been taking.   ROV 09/26/11 -- severe COPD, hx R PTX, on Spiriva. Since last visit his O2 was stopped after he underwent reassuring walking oximetry. Denies CP, does wheeze but no different than usual, some cough. He was treated for PNA this fall, admitted to [facility].   ROV 03/25/12 -- severe COPD, hx R PTX, on Spiriva. Still smoking 1 pk/week. He was hospitalized once since last time for his heart valve. Has caused him to slow down his activity - no longer doing yard work. Takes  Spiriva daily.      Objective:   Physical Exam Filed Vitals:   03/25/12 1433  BP: 130/72  Pulse: 61  Temp: 97.8 F (36.6 C)    GEN: A/Ox3; pleasant , NAD, thin  HEENT:  [place]/AT,  EACs-clear, TMs-wnl, NOSE-clear, THROAT-clear, no lesions, no postnasal drip or exudate noted.   NECK:  Supple w/ fair ROM; no JVD; normal carotid impulses w/o bruits; no thyromegaly or nodules palpated; no lymphadenopathy.  RESP  Coarse BS w/ diminshed bs in bases no accessory muscle use, no dullness to percussion, no wheeze  CARD:  RRR, no m/r/g  , no peripheral edema, pulses intact, no cyanosis or clubbing.  Musco: Warm bil, no deformities or joint swelling noted.   Neuro: alert, no focal deficits noted.    Skin: Warm, no lesions or rashes   Assessment & Plan:  COPD (chronic obstructive pulmonary disease) Some overall decline since last time, but I do not believe this is due to his COPD.  - refill spiriva - encouraged smoking cessation - rov 3 mon

## 2012-04-22 ENCOUNTER — Ambulatory Visit (INDEPENDENT_AMBULATORY_CARE_PROVIDER_SITE_OTHER): Payer: Medicare Other | Admitting: Ophthalmology

## 2012-04-22 DIAGNOSIS — H43819 Vitreous degeneration, unspecified eye: Secondary | ICD-10-CM

## 2012-04-22 DIAGNOSIS — H3560 Retinal hemorrhage, unspecified eye: Secondary | ICD-10-CM

## 2012-04-22 DIAGNOSIS — H3530 Unspecified macular degeneration: Secondary | ICD-10-CM

## 2012-04-22 DIAGNOSIS — H35379 Puckering of macula, unspecified eye: Secondary | ICD-10-CM

## 2012-05-01 ENCOUNTER — Encounter: Payer: Self-pay | Admitting: Internal Medicine

## 2012-05-05 ENCOUNTER — Emergency Department (HOSPITAL_COMMUNITY): Payer: Medicare Other

## 2012-05-05 ENCOUNTER — Inpatient Hospital Stay (HOSPITAL_COMMUNITY)
Admission: EM | Admit: 2012-05-05 | Discharge: 2012-05-08 | DRG: 192 | Disposition: A | Discharge status: Home / Self Care | Payer: Medicare Other | Attending: Cardiovascular Disease | Admitting: Cardiovascular Disease

## 2012-05-05 ENCOUNTER — Encounter (HOSPITAL_COMMUNITY): Payer: Self-pay | Admitting: *Deleted

## 2012-05-05 ENCOUNTER — Inpatient Hospital Stay (HOSPITAL_COMMUNITY): Payer: Medicare Other

## 2012-05-05 DIAGNOSIS — J441 Chronic obstructive pulmonary disease with (acute) exacerbation: Principal | ICD-10-CM | POA: Diagnosis present

## 2012-05-05 DIAGNOSIS — F17200 Nicotine dependence, unspecified, uncomplicated: Secondary | ICD-10-CM | POA: Diagnosis present

## 2012-05-05 DIAGNOSIS — Z8249 Family history of ischemic heart disease and other diseases of the circulatory system: Secondary | ICD-10-CM

## 2012-05-05 DIAGNOSIS — Z9089 Acquired absence of other organs: Secondary | ICD-10-CM

## 2012-05-05 DIAGNOSIS — I10 Essential (primary) hypertension: Secondary | ICD-10-CM | POA: Diagnosis present

## 2012-05-05 DIAGNOSIS — Z8673 Personal history of transient ischemic attack (TIA), and cerebral infarction without residual deficits: Secondary | ICD-10-CM

## 2012-05-05 DIAGNOSIS — I509 Heart failure, unspecified: Secondary | ICD-10-CM | POA: Diagnosis present

## 2012-05-05 DIAGNOSIS — Z951 Presence of aortocoronary bypass graft: Secondary | ICD-10-CM

## 2012-05-05 DIAGNOSIS — I2510 Atherosclerotic heart disease of native coronary artery without angina pectoris: Secondary | ICD-10-CM | POA: Diagnosis present

## 2012-05-05 DIAGNOSIS — Z79899 Other long term (current) drug therapy: Secondary | ICD-10-CM

## 2012-05-05 DIAGNOSIS — G9389 Other specified disorders of brain: Secondary | ICD-10-CM | POA: Diagnosis present

## 2012-05-05 DIAGNOSIS — R42 Dizziness and giddiness: Secondary | ICD-10-CM | POA: Diagnosis present

## 2012-05-05 DIAGNOSIS — J449 Chronic obstructive pulmonary disease, unspecified: Secondary | ICD-10-CM | POA: Diagnosis present

## 2012-05-05 DIAGNOSIS — Z950 Presence of cardiac pacemaker: Secondary | ICD-10-CM

## 2012-05-05 DIAGNOSIS — Z720 Tobacco use: Secondary | ICD-10-CM | POA: Diagnosis present

## 2012-05-05 DIAGNOSIS — Z9079 Acquired absence of other genital organ(s): Secondary | ICD-10-CM

## 2012-05-05 DIAGNOSIS — Z7901 Long term (current) use of anticoagulants: Secondary | ICD-10-CM

## 2012-05-05 DIAGNOSIS — I4891 Unspecified atrial fibrillation: Secondary | ICD-10-CM | POA: Diagnosis present

## 2012-05-05 DIAGNOSIS — Z8546 Personal history of malignant neoplasm of prostate: Secondary | ICD-10-CM

## 2012-05-05 LAB — CBC WITH DIFFERENTIAL/PLATELET
Basophils Absolute: 0 10*3/uL (ref 0.0–0.1)
Lymphocytes Relative: 13 % (ref 12–46)
Neutro Abs: 10.8 10*3/uL — ABNORMAL HIGH (ref 1.7–7.7)
Platelets: 220 10*3/uL (ref 150–400)
RDW: 14.3 % (ref 11.5–15.5)
WBC: 14.1 10*3/uL — ABNORMAL HIGH (ref 4.0–10.5)

## 2012-05-05 LAB — COMPREHENSIVE METABOLIC PANEL
ALT: 15 U/L (ref 0–53)
AST: 25 U/L (ref 0–37)
CO2: 29 mEq/L (ref 19–32)
Chloride: 101 mEq/L (ref 96–112)
GFR calc non Af Amer: 58 mL/min — ABNORMAL LOW (ref >90)
Potassium: 4 mEq/L (ref 3.5–5.1)
Sodium: 140 mEq/L (ref 135–145)
Total Bilirubin: 0.6 mg/dL (ref 0.3–1.2)

## 2012-05-05 MED ORDER — SODIUM CHLORIDE 0.9 % IJ SOLN
3.0000 mL | Freq: Two times a day (BID) | INTRAMUSCULAR | Status: DC
Start: 2012-05-05 — End: 2012-05-08
  Administered 2012-05-05 – 2012-05-08 (×5): 3 mL via INTRAVENOUS

## 2012-05-05 MED ORDER — DICLOFENAC SODIUM 50 MG PO TBEC
50.0000 mg | DELAYED_RELEASE_TABLET | Freq: Two times a day (BID) | ORAL | Status: DC
  Administered 2012-05-06 – 2012-05-08 (×6): 50 mg via ORAL
  Filled 2012-05-05 (×7): qty 1

## 2012-05-05 MED ORDER — ACETAMINOPHEN 325 MG PO TABS
650.0000 mg | ORAL_TABLET | Freq: Once | ORAL | Status: AC
  Administered 2012-05-05: 650 mg via ORAL
  Filled 2012-05-05: qty 2

## 2012-05-05 MED ORDER — HEPARIN SODIUM (PORCINE) 5000 UNIT/ML IJ SOLN
5000.0000 [IU] | Freq: Three times a day (TID) | INTRAMUSCULAR | Status: DC
  Filled 2012-05-05 (×2): qty 1

## 2012-05-05 MED ORDER — WARFARIN - PHYSICIAN DOSING INPATIENT
Freq: Every day | Status: DC
  Administered 2012-05-07: 19:00:00

## 2012-05-05 MED ORDER — CYCLOBENZAPRINE HCL 10 MG PO TABS
5.0000 mg | ORAL_TABLET | Freq: Every day | ORAL | Status: DC
  Administered 2012-05-05 – 2012-05-07 (×3): 5 mg via ORAL
  Filled 2012-05-05 (×4): qty 0.5

## 2012-05-05 MED ORDER — SODIUM CHLORIDE 0.9 % IJ SOLN: 3.0000 mL | INTRAMUSCULAR | Status: DC | PRN

## 2012-05-05 MED ORDER — ADULT MULTIVITAMIN W/MINERALS CH
1.0000 | ORAL_TABLET | Freq: Every day | ORAL | Status: DC
  Administered 2012-05-06 – 2012-05-08 (×3): 1 via ORAL
  Filled 2012-05-05 (×3): qty 1

## 2012-05-05 MED ORDER — PANTOPRAZOLE SODIUM 40 MG PO TBEC
40.0000 mg | DELAYED_RELEASE_TABLET | Freq: Every day | ORAL | Status: DC
  Administered 2012-05-06 – 2012-05-08 (×3): 40 mg via ORAL
  Filled 2012-05-05 (×3): qty 1

## 2012-05-05 MED ORDER — DOCUSATE SODIUM 100 MG PO CAPS
100.0000 mg | ORAL_CAPSULE | Freq: Two times a day (BID) | ORAL | Status: DC
  Administered 2012-05-05 – 2012-05-08 (×6): 100 mg via ORAL
  Filled 2012-05-05 (×7): qty 1

## 2012-05-05 MED ORDER — ONDANSETRON HCL 4 MG PO TABS: 4.0000 mg | ORAL_TABLET | Freq: Four times a day (QID) | ORAL | Status: DC | PRN

## 2012-05-05 MED ORDER — DEXTROSE 5 % IV SOLN
1.0000 g | INTRAVENOUS | Status: DC
  Administered 2012-05-05 – 2012-05-07 (×3): 1 g via INTRAVENOUS
  Filled 2012-05-05 (×5): qty 10

## 2012-05-05 MED ORDER — CARVEDILOL 3.125 MG PO TABS
3.1250 mg | ORAL_TABLET | Freq: Two times a day (BID) | ORAL | Status: DC
  Administered 2012-05-06 – 2012-05-08 (×6): 3.125 mg via ORAL
  Filled 2012-05-05 (×7): qty 1

## 2012-05-05 MED ORDER — SODIUM CHLORIDE 0.9 % IV SOLN
INTRAVENOUS | Status: AC
  Administered 2012-05-05: 22:00:00 via INTRAVENOUS

## 2012-05-05 MED ORDER — WARFARIN SODIUM 7.5 MG PO TABS: 3.7500 mg | ORAL_TABLET | Freq: Every day | ORAL | Status: DC

## 2012-05-05 MED ORDER — WARFARIN SODIUM 2.5 MG PO TABS: 3.7500 mg | ORAL_TABLET | ORAL | Status: DC

## 2012-05-05 MED ORDER — TIOTROPIUM BROMIDE MONOHYDRATE 18 MCG IN CAPS
18.0000 ug | ORAL_CAPSULE | Freq: Every day | RESPIRATORY_TRACT | Status: DC
  Administered 2012-05-07 – 2012-05-08 (×2): 18 ug via RESPIRATORY_TRACT
  Filled 2012-05-05: qty 5

## 2012-05-05 MED ORDER — SODIUM CHLORIDE 0.9 % IV SOLN: 250.0000 mL | INTRAVENOUS | Status: DC | PRN

## 2012-05-05 MED ORDER — ALUM & MAG HYDROXIDE-SIMETH 200-200-20 MG/5ML PO SUSP: 30.0000 mL | Freq: Four times a day (QID) | ORAL | Status: DC | PRN

## 2012-05-05 MED ORDER — ATORVASTATIN CALCIUM 20 MG PO TABS
20.0000 mg | ORAL_TABLET | Freq: Every day | ORAL | Status: DC
  Administered 2012-05-06 – 2012-05-08 (×3): 20 mg via ORAL
  Filled 2012-05-05 (×3): qty 1

## 2012-05-05 MED ORDER — ONDANSETRON HCL 4 MG/2ML IJ SOLN: 4.0000 mg | Freq: Four times a day (QID) | INTRAMUSCULAR | Status: DC | PRN

## 2012-05-05 MED ORDER — NITROGLYCERIN 0.4 MG SL SUBL: 0.4000 mg | SUBLINGUAL_TABLET | SUBLINGUAL | Status: DC | PRN

## 2012-05-05 MED ORDER — SODIUM CHLORIDE 0.9 % IJ SOLN: 3.0000 mL | Freq: Two times a day (BID) | INTRAMUSCULAR | Status: DC

## 2012-05-05 MED ORDER — WARFARIN SODIUM 7.5 MG PO TABS
7.5000 mg | ORAL_TABLET | ORAL | Status: DC
  Administered 2012-05-05 – 2012-05-06 (×2): 7.5 mg via ORAL
  Filled 2012-05-05 (×3): qty 1

## 2012-05-05 MED ORDER — LOSARTAN POTASSIUM 50 MG PO TABS
50.0000 mg | ORAL_TABLET | Freq: Every day | ORAL | Status: DC
  Administered 2012-05-06 – 2012-05-08 (×3): 50 mg via ORAL
  Filled 2012-05-05 (×3): qty 1

## 2012-05-05 NOTE — ED Notes (Signed)
REPORT GIVEN TO 300 WEST UNIT NURSE , DENIES  ANY PAIN OR DISCOMFORT , RESPIRATIONS UNLABORED , IV SITE UNREMARKABLE , SPOUSE AT BEDSIDE. TRANSPORTED BY TELETECH.

## 2012-05-05 NOTE — ED Notes (Signed)
Pt needs assistance when standing.  He almost fell in the br .  He was so weak when he stood up he almost fell

## 2012-05-05 NOTE — ED Notes (Signed)
The pt woke up after a nap at 1200n and did not feel well c/o being coldcoughing.  He was well this am

## 2012-05-05 NOTE — H&P (Signed)
[name] is an 78 y.o. male.   Chief Complaint: Dizziness HPI: 78 years old male felt weak and dizzy post visit to [facility] earlier in the day. No chest pain. Chronic shortness of breath and cough with end stage COPD. Mild fever on the floor with elevated white count.  Past Medical History  Diagnosis Date  . COPD (chronic obstructive pulmonary disease)   . Prostate cancer 2003  . Stroke 2000  . CAD (coronary artery disease)   . AF (atrial fibrillation)     permanent  . HTN (hypertension)   . Pneumothorax, right   . Complete heart block     S/P pacemaker insertion// St. Jude Accent RFSR  . Pacemaker   . CHF (congestive heart failure)   . Pneumonia     hx of on right   . Tobacco abuse       Past Surgical History  Procedure Date  . Coronary artery bypass graft 2003  . Prostatectomy 2003  . Appendectomy   . Eye surgery     bilateral for gaze problem  . Pacemaker insertion 11/06/10    St jude pacemaker implanted by Dr Johney Frame for complete heart block  . Cholecystectomy     Family History  Problem Relation Age of Onset  . Heart attack Father     died age 70  . Heart disease Mother   . Heart disease Sister   . Heart disease Brother   . Multiple sclerosis Mother     died age 40  . Kidney failure Brother    Social History:  reports that he has been smoking Cigarettes.  He has a 120 pack-year smoking history. He has never used smokeless tobacco. He reports that he does not drink alcohol or use illicit drugs.  Allergies: No Known Allergies  Medications Prior to Admission  Medication Sig Dispense Refill  . atorvastatin (LIPITOR) 20 MG tablet Take 20 mg by mouth daily.      Marland Kitchen CALCIUM-VITAMIN D PO Take 1 tablet by mouth daily.      . carvedilol (COREG) 3.125 MG tablet Take 3.125 mg by mouth 2 (two) times daily with a meal.      . furosemide (LASIX) 20 MG tablet Take 20 mg by mouth daily.      Marland Kitchen lisinopril (PRINIVIL,ZESTRIL) 5 MG tablet Take 5 mg by mouth daily.      Marland Kitchen  losartan (COZAAR) 50 MG tablet Take 50 mg by mouth daily.      . Multiple Vitamin (MULITIVITAMIN WITH MINERALS) TABS Take 1 tablet by mouth daily.      . nitroGLYCERIN (NITROSTAT) 0.4 MG SL tablet Place 0.4 mg under the tongue every 5 (five) minutes as needed. For chest pain      . pantoprazole (PROTONIX) 40 MG tablet Take 40 mg by mouth daily.      Marland Kitchen tiotropium (SPIRIVA) 18 MCG inhalation capsule Place 1 capsule (18 mcg total) into inhaler and inhale daily.  30 capsule  12  . warfarin (COUMADIN) 7.5 MG tablet Take 3.75-7.5 mg by mouth daily. Sundays-0.5 tablet All other days-1 tablet        Results for orders placed during the hospital encounter of 05/05/12 (from the past 48 hour(s))  CBC WITH DIFFERENTIAL     Status: Abnormal   Collection Time   05/05/12  5:23 PM      Component Value Range Comment   WBC 14.1 (*) 4.0 - 10.5 K/uL    RBC 4.81  4.22 -  5.81 MIL/uL    Hemoglobin 14.1  13.0 - 17.0 g/dL    HCT 08.6  57.8 - 46.9 %    MCV 90.0  78.0 - 100.0 fL    MCH 29.3  26.0 - 34.0 pg    MCHC 32.6  30.0 - 36.0 g/dL    RDW 62.9  52.8 - 41.3 %    Platelets 220  150 - 400 K/uL    Neutrophils Relative 76  43 - 77 %    Neutro Abs 10.8 (*) 1.7 - 7.7 K/uL    Lymphocytes Relative 13  12 - 46 %    Lymphs Abs 1.8  0.7 - 4.0 K/uL    Monocytes Relative 10  3 - 12 %    Monocytes Absolute 1.5 (*) 0.1 - 1.0 K/uL    Eosinophils Relative 1  0 - 5 %    Eosinophils Absolute 0.1  0.0 - 0.7 K/uL    Basophils Relative 0  0 - 1 %    Basophils Absolute 0.0  0.0 - 0.1 K/uL   COMPREHENSIVE METABOLIC PANEL     Status: Abnormal   Collection Time   05/05/12  5:23 PM      Component Value Range Comment   Sodium 140  135 - 145 mEq/L    Potassium 4.0  3.5 - 5.1 mEq/L    Chloride 101  96 - 112 mEq/L    CO2 29  19 - 32 mEq/L    Glucose, Bld 113 (*) 70 - 99 mg/dL    BUN 23  6 - 23 mg/dL    Creatinine, Ser 2.44  0.50 - 1.35 mg/dL    Calcium 9.8  8.4 - 01.0 mg/dL    Total Protein 7.7  6.0 - 8.3 g/dL    Albumin 4.1   3.5 - 5.2 g/dL    AST 25  0 - 37 U/L    ALT 15  0 - 53 U/L    Alkaline Phosphatase 104  39 - 117 U/L    Total Bilirubin 0.6  0.3 - 1.2 mg/dL    GFR calc non Af Amer 58 (*) >90 mL/min    GFR calc Af Amer 67 (*) >90 mL/min   PROTIME-INR     Status: Abnormal   Collection Time   05/05/12  7:09 PM      Component Value Range Comment   Prothrombin Time 31.0 (*) 11.6 - 15.2 seconds    INR 2.93 (*) 0.00 - 1.49    Dg Chest 2 View  05/05/2012  *RADIOLOGY REPORT*  Clinical Data: COPD, coughing and smoker.  CHEST - 2 VIEW  Comparison: 12/28/2011  Findings: Two views the chest demonstrate a left cardiac pacemaker. The cardiac lead is in the region of the right ventricle.  There are chronic emphysematous changes throughout the lungs.  Heart size is normal.  Previous median sternotomy.  Evidence for scarring at the lung apices.  IMPRESSION: Chronic lung changes with severe emphysematous disease.  No evidence for new airspace disease.   Original Report Authenticated By: Richarda Overlie, M.D.    Ct Head Wo Contrast  05/05/2012  *RADIOLOGY REPORT*  Clinical Data: Dizziness and weakness all over.  CT HEAD WITHOUT CONTRAST  Technique:  Contiguous axial images were obtained from the base of the skull through the vertex without contrast.  Comparison: 01/07/2012  Findings: There is chronic encephalomalacia in the left occipital lobe.  Stable patchy areas of low density throughout the white matter.  Stable appearance of  the ventricles and basal cisterns. Evidence for lacunar injuries involving the right thalamus and the right internal capsule.  No evidence for acute hemorrhage, mass lesion, midline shift, hydrocephalus or large new infarct.  Polyp or retention cyst in the left maxillary sinus.  No acute bony abnormality.  IMPRESSION: No acute intracranial abnormality.  Chronic white matter disease, possibly representing chronic small vessel ischemic changes.  Stable encephalomalacia in the left occipital lobe and evidence of old  lacunar injuries.   Original Report Authenticated By: Richarda Overlie, M.D.     @ROS @ No weight gain or loss. + COPD, + Chest pain, + Shortness of breath, + Pacemaker. + Pneumonia, + Stroke and Ca prostate, + CAD, + arthritis. Blood pressure 136/84, pulse 94, temperature 100.9 F (38.3 C), temperature source Oral, resp. rate 14, SpO2 99.00%.  HEENT: [place]/AT, blue eyes, Conjunctiva pink. Sclera- non-icteric, wears glasses. NECK: Supple. No JVD.  LUNGS: Decreased breath sounds on both base along without rhonchi and rales.  HEART: Irregularly irregular rhythm. Soft systolic murmur.  ABDOMEN: Soft. Bowel sounds present, nontender.  EXTREMITIES: No edema, cyanosis, or clubbing.  CNS:- Grossly intact. Moves all 4 extremities.     Assessment/Plan Dizziness Fever COPD HTN A. Fibrillation Perm. Pacemaker implant. CAD  Admit/Telemetry/IV fluid/IV antibiotic  Kindel Rochefort S 05/05/2012, 8:23 PM

## 2012-05-06 LAB — URINALYSIS, ROUTINE W REFLEX MICROSCOPIC
Glucose, UA: NEGATIVE mg/dL
Protein, ur: 30 mg/dL — AB
Specific Gravity, Urine: 1.018 (ref 1.005–1.030)
Urobilinogen, UA: 1 mg/dL (ref 0.0–1.0)

## 2012-05-06 LAB — BASIC METABOLIC PANEL
CO2: 30 mEq/L (ref 19–32)
Calcium: 9.5 mg/dL (ref 8.4–10.5)
Creatinine, Ser: 1.05 mg/dL (ref 0.50–1.35)
Glucose, Bld: 90 mg/dL (ref 70–99)
Sodium: 139 mEq/L (ref 135–145)

## 2012-05-06 LAB — URINE MICROSCOPIC-ADD ON

## 2012-05-06 NOTE — Progress Notes (Signed)
Subjective:  Feeling better. Able to stand up. T max 100.9.  Objective:  Vital Signs in the last 24 hours: Temp:  [98.2 F (36.8 C)-100.9 F (38.3 C)] 98.8 F (37.1 C) (09/11 0500) Pulse Rate:  [65-94] 65  (09/11 0500) Cardiac Rhythm:  [-] Ventricular paced (09/10 2020) Resp:  [14-20] 18  (09/11 0500) BP: (136-154)/(70-84) 154/70 mmHg (09/11 0500) SpO2:  [90 %-99 %] 94 % (09/11 0500) Weight:  [71.986 kg (158 lb 11.2 oz)] 71.986 kg (158 lb 11.2 oz) (09/10 2022)  Physical Exam: BP Readings from Last 1 Encounters:  05/06/12 154/70    Wt Readings from Last 1 Encounters:  05/05/12 71.986 kg (158 lb 11.2 oz)    Weight change:   HEENT: [place]/AT, Eyes-Blue, PERL, EOMI, Conjunctiva-Pink, Sclera-Non-icteric Neck: No JVD, No bruit, Trachea midline. Lungs:  Clear, Bilateral. Decreased air entry at bases. Cardiac:  Regular rhythm, normal S1 and S2, no S3. II/VI systolic murmur Abdomen:  Soft, non-tender. Extremities:  No edema present. No cyanosis. No clubbing. CNS: AxOx3, Cranial nerves grossly intact, moves all 4 extremities. Right handed. Skin: Warm and dry.   Intake/Output from previous day:      Lab Results: BMET    Component Value Date/Time   NA 139 05/06/2012 0507   K 4.5 05/06/2012 0507   CL 102 05/06/2012 0507   CO2 30 05/06/2012 0507   GLUCOSE 90 05/06/2012 0507   BUN 22 05/06/2012 0507   CREATININE 1.05 05/06/2012 0507   CALCIUM 9.5 05/06/2012 0507   GFRNONAA 65* 05/06/2012 0507   GFRAA 76* 05/06/2012 0507   CBC    Component Value Date/Time   WBC 14.1* 05/05/2012 1723   RBC 4.81 05/05/2012 1723   HGB 14.1 05/05/2012 1723   HCT 43.3 05/05/2012 1723   PLT 220 05/05/2012 1723   MCV 90.0 05/05/2012 1723   MCH 29.3 05/05/2012 1723   MCHC 32.6 05/05/2012 1723   RDW 14.3 05/05/2012 1723   LYMPHSABS 1.8 05/05/2012 1723   MONOABS 1.5* 05/05/2012 1723   EOSABS 0.1 05/05/2012 1723   BASOSABS 0.0 05/05/2012 1723   CARDIAC ENZYMES Lab Results  Component Value Date   CKTOTAL 87  01/08/2012   CKMB 5.9* 01/08/2012   TROPONINI <0.30 01/08/2012    Assessment/Plan:  Patient Active Hospital Problem List: Dizziness (01/08/2012)   Assessment: Improved   Plan: Increase activity as tolerated COPD (chronic obstructive pulmonary disease) (02/06/2011)   Assessment: Stable   Plan: Continue oxygen Permanent atrial fibrillation (02/06/2011)   Assessment: stable   Plan: Continue coumadin CAD (coronary artery disease) (02/06/2011) Hypertension (04/08/2011) Tobacco abuse (03/02/2012)    LOS: 1 day    Orpah Cobb  MD  05/06/2012, 9:14 AM

## 2012-05-07 LAB — CBC
Hemoglobin: 12.4 g/dL — ABNORMAL LOW (ref 13.0–17.0)
MCHC: 32.5 g/dL (ref 30.0–36.0)
Platelets: 184 10*3/uL (ref 150–400)
RDW: 14.3 % (ref 11.5–15.5)

## 2012-05-07 LAB — PROTIME-INR
INR: 3.23 — ABNORMAL HIGH (ref 0.00–1.49)
Prothrombin Time: 33.5 seconds — ABNORMAL HIGH (ref 11.6–15.2)

## 2012-05-07 MED ORDER — AMLODIPINE BESYLATE 5 MG PO TABS
5.0000 mg | ORAL_TABLET | Freq: Every day | ORAL | Status: DC
  Administered 2012-05-07 – 2012-05-08 (×2): 5 mg via ORAL
  Filled 2012-05-07 (×2): qty 1

## 2012-05-07 NOTE — Progress Notes (Signed)
Subjective:  Feeling better. Less dizzy. Decreasing cough. PT evaluation ordered. Afebrile.  Objective:  Vital Signs in the last 24 hours: Temp:  [97.7 F (36.5 C)-98.3 F (36.8 C)] 98.3 F (36.8 C) (09/12 0526) Pulse Rate:  [60-70] 70  (09/12 0753) Cardiac Rhythm:  [-] Ventricular paced (09/12 0741) Resp:  [16-18] 18  (09/12 0526) BP: (138-164)/(62-71) 145/63 mmHg (09/12 0753) SpO2:  [92 %-94 %] 94 % (09/12 0526) Weight:  [74.072 kg (163 lb 4.8 oz)] 74.072 kg (163 lb 4.8 oz) (09/12 0526)  Physical Exam: BP Readings from Last 1 Encounters:  05/07/12 145/63    Wt Readings from Last 1 Encounters:  05/07/12 74.072 kg (163 lb 4.8 oz)    Weight change: 2.087 kg (4 lb 9.6 oz)  HEENT: [place]/AT, Eyes-Blue, PERL, EOMI, Conjunctiva-Pink, Sclera-Non-icteric Neck: No JVD, No bruit, Trachea midline. Lungs:  Clear, Bilateral. Cardiac:  Regular rhythm, normal S1 and S2, no S3.  Abdomen:  Soft, non-tender. Extremities:  No edema present. No cyanosis. No clubbing. CNS: AxOx3, Cranial nerves grossly intact, moves all 4 extremities. Right handed. Skin: Warm and dry.   Intake/Output from previous day: 09/11 0701 - 09/12 0700 In: 3 [I.V.:3] Out: 750 [Urine:750]    Lab Results: BMET    Component Value Date/Time   NA 139 05/06/2012 0507   K 4.5 05/06/2012 0507   CL 102 05/06/2012 0507   CO2 30 05/06/2012 0507   GLUCOSE 90 05/06/2012 0507   BUN 22 05/06/2012 0507   CREATININE 1.05 05/06/2012 0507   CALCIUM 9.5 05/06/2012 0507   GFRNONAA 65* 05/06/2012 0507   GFRAA 76* 05/06/2012 0507   CBC    Component Value Date/Time   WBC 10.3 05/07/2012 0450   RBC 4.29 05/07/2012 0450   HGB 12.4* 05/07/2012 0450   HCT 38.1* 05/07/2012 0450   PLT 184 05/07/2012 0450   MCV 88.8 05/07/2012 0450   MCH 28.9 05/07/2012 0450   MCHC 32.5 05/07/2012 0450   RDW 14.3 05/07/2012 0450   LYMPHSABS 1.8 05/05/2012 1723   MONOABS 1.5* 05/05/2012 1723   EOSABS 0.1 05/05/2012 1723   BASOSABS 0.0 05/05/2012 1723   CARDIAC  ENZYMES Lab Results  Component Value Date   CKTOTAL 87 01/08/2012   CKMB 5.9* 01/08/2012   TROPONINI <0.30 01/08/2012    Assessment/Plan:  Patient Active Hospital Problem List:  Dizziness (01/08/2012) Assessment: Improved Plan: Increase activity as tolerated COPD (chronic obstructive pulmonary disease) (02/06/2011) Assessment: Stable Plan: Off oxygen per patient. Permanent atrial fibrillation (02/06/2011) Assessment: stable Plan: Continue coumadin Permanent pacemaker CAD (coronary artery disease) (02/06/2011) Hypertension (04/08/2011) Tobacco abuse (03/02/2012)  Home soon   LOS: 2 days    Orpah Cobb  MD  05/07/2012, 9:56 AM

## 2012-05-07 NOTE — Progress Notes (Signed)
Pt bp 218/106 manually. Dr Algie Coffer paged and new orders received will continue to monitor. Pt denies any vision changes or headache.

## 2012-05-07 NOTE — Clinical Documentation Improvement (Signed)
GENERIC DOCUMENTATION CLARIFICATION QUERY  THIS DOCUMENT IS NOT A PERMANENT PART OF THE MEDICAL RECORD  TO RESPOND TO THE THIS QUERY, FOLLOW THE INSTRUCTIONS BELOW:  1. If needed, update documentation for the patient's encounter via the notes activity.  2. Access this query again and click edit on the In [employer].  3. After updating, or not, click F2 to complete all highlighted (required) fields concerning your review. Select "additional documentation in the medical record" OR "no additional documentation provided".  4. Click Sign note button.  5. The deficiency will fall out of your In Basket *Please let us know if you are not able to complete this workflow by phone or e-mail (listed below).  Please update your documentation within the medical record to reflect your response to this query.                                                                                        05/07/12   Dear Dr. Algie Coffer  / Associates,  In a better effort to capture your patient's severity of illness, reflect appropriate length of stay and utilization of resources, a review of the patient medical record has revealed the following indicators.   PLEASE CLARIFY SUSPECTED UNDERLYING CAUSE OF ADMITTING SYMPTOM "DIZZINESS", WHAT IS BEING TREATED, IN NOTES AND DC SUMMARY. THANK YOU.  Possible Clinical Conditions? -  URI -  Early CAP -  Impending AE COPD -  Other condition (please state) -  Unable to clinically determine    Supporting Information: -  Risk Factors: Severe COPD, ES COPD, CAD, HTN, Permanent Afib with PPM, CHF, Tobacco Abuse -  Signs & Symptoms: Weakness, Dizziness, Fever, Cough -  Diagnostics: WBC:14.1, Neuts:10.8, T:100.9 -  Treatment: Rocephin IV, O2   You may use possible, probable, or suspect with inpatient documentation. possible, probable, suspected diagnoses MUST be documented at the time of discharge  Reviewed: additional documentation in the medical record  Thank  You,  Beverley Fiedler RN Clinical Documentation Specialist: Pager:  [phone] Health Information Management:  [phone] [facility]

## 2012-05-07 NOTE — Evaluation (Signed)
Physical Therapy Evaluation Patient Details Name: [name] MRN: [id number] DOB: [birth year] Today's Date: 05/07/2012 Time: 1346-1400 PT Time Calculation (min): 14 min  PT Assessment / Plan / Recommendation Clinical Impression  pt rpesents with COPD.  pt very motivated to increase mobility and activity tolerance.  pt notes he has DME at home from a previous illness and will be able to use his RW at D/C.      PT Assessment  Patient needs continued PT services    Follow Up Recommendations  Home health PT;Supervision - Intermittent    Barriers to Discharge None      Equipment Recommendations  None recommended by PT    Recommendations for Other Services OT consult   Frequency Min 3X/week    Precautions / Restrictions Precautions Precautions: Fall Restrictions Weight Bearing Restrictions: No   Pertinent Vitals/Pain Denies pain.        Mobility  Bed Mobility Bed Mobility: Not assessed Transfers Transfers: Sit to Stand;Stand to Sit Sit to Stand: 5: Supervision;With upper extremity assist;From chair/3-in-1;With armrests Stand to Sit: 5: Supervision;With upper extremity assist;To chair/3-in-1;With armrests Details for Transfer Assistance: demo's good use of UEs.   Ambulation/Gait Ambulation/Gait Assistance: 4: Min guard;4: Min Environmental consultant (Feet): 150 Feet Assistive device: None Ambulation/Gait Assistance Details: pt mildly unsteady with ~3 LOBs requiring side step and MinA to prevent fall.   Gait Pattern: Step-through pattern;Decreased stride length;Narrow base of support Stairs: No Wheelchair Mobility Wheelchair Mobility: No    Exercises     PT Diagnosis: Difficulty walking  PT Problem List: Decreased activity tolerance;Decreased balance;Decreased mobility;Decreased knowledge of use of DME PT Treatment Interventions: DME instruction;Gait training;Stair training;Functional mobility training;Therapeutic activities;Therapeutic exercise;Balance  training;Patient/family education   PT Goals Acute Rehab PT Goals PT Goal Formulation: With patient Time For Goal Achievement: 05/21/12 Potential to Achieve Goals: Good Pt will go Supine/Side to Sit: with modified independence PT Goal: Supine/Side to Sit - Progress: Goal set today Pt will go Sit to Supine/Side: with modified independence PT Goal: Sit to Supine/Side - Progress: Goal set today Pt will go Sit to Stand: with modified independence PT Goal: Sit to Stand - Progress: Goal set today Pt will go Stand to Sit: with modified independence PT Goal: Stand to Sit - Progress: Goal set today Pt will Ambulate: >150 feet;with modified independence;with least restrictive assistive device PT Goal: Ambulate - Progress: Goal set today Pt will Go Up / Down Stairs: 3-5 stairs;with supervision;with rail(s) PT Goal: Up/Down Stairs - Progress: Goal set today  Visit Information  Last PT Received On: 05/07/12 Assistance Needed: +1    Subjective Data  Subjective: I had been down at [facility] getting groceries and when I came home this happened.   Patient Stated Goal: Home   Prior Functioning  Home Living Lives With: Spouse Available Help at Discharge: Family;Available 24 hours/day Type of Home: House Home Access: Stairs to enter [employer] of Steps: 3 Home Layout: One level Bathroom Shower/Tub: Engineer, manufacturing systems: Standard Home Adaptive Equipment: Straight cane;Walker - rolling Prior Function Level of Independence: Needs assistance Needs Assistance: Light Housekeeping;Meal Prep Meal Prep: Moderate Light Housekeeping: Moderate Able to Take Stairs?: Yes Driving: No Vocation: Retired Musician: No difficulties Dominant Hand: Right    Cognition  Overall Cognitive Status: Appears within functional limits for tasks assessed/performed Arousal/Alertness: Awake/alert Orientation Level: Appears intact for tasks assessed Behavior During Session:  [facility] for tasks performed    Extremity/Trunk Assessment Right Lower Extremity Assessment RLE ROM/Strength/Tone: [facility] for tasks  assessed RLE Sensation: WFL - Light Touch Left Lower Extremity Assessment LLE ROM/Strength/Tone: WFL for tasks assessed LLE Sensation: WFL - Light Touch Trunk Assessment Trunk Assessment: Normal   Balance Balance Balance Assessed: Yes High Level Balance High Level Balance Activites: Direction changes;Turns;Head turns;Sudden stops  End of Session PT - End of Session Equipment Utilized During Treatment: Gait belt Activity Tolerance: Patient limited by fatigue Patient left: in chair;with call bell/phone within reach Nurse Communication: Mobility status  GP     Sunny Schlein, [place] [phone] 05/07/2012, 2:08 PM

## 2012-05-07 NOTE — Care Management Note (Signed)
    Page 1 of 1   05/07/2012     11:18:05 AM   CARE MANAGEMENT NOTE 05/07/2012  Patient:  [name]   Account Number:  [id number]  Date Initiated:  05/07/2012  Documentation initiated by:  GRAVES-BIGELOW,Jakory Matsuo  Subjective/Objective Assessment:   Pt admitted with dizziness. Placed on IV ABX due to fever. Pt is from home with wife and plans to return once stable.     Action/Plan:   CM spoke to pt and wife and no needs for home at this time. No further needs for CM.   Anticipated DC Date:  05/07/2012   Anticipated DC Plan:  HOME/SELF CARE  In-house referral  NA      DC Planning Services  CM consult      Choice offered to / List presented to:             Status of service:  Completed, signed off Medicare Important Message given?   (If response is "NO", the following Medicare IM given date fields will be blank) Date Medicare IM given:   Date Additional Medicare IM given:    Discharge Disposition:  HOME/SELF CARE  Per UR Regulation:  Reviewed for med. necessity/level of care/duration of stay  If discussed at Long Length of Stay Meetings, dates discussed:    Comments:

## 2012-05-08 ENCOUNTER — Inpatient Hospital Stay (HOSPITAL_COMMUNITY): Payer: Medicare Other

## 2012-05-08 LAB — PROTIME-INR
INR: 3.07 — ABNORMAL HIGH (ref 0.00–1.49)
Prothrombin Time: 32.2 seconds — ABNORMAL HIGH (ref 11.6–15.2)

## 2012-05-08 MED ORDER — HYDROCODONE-ACETAMINOPHEN 5-500 MG PO TABS: 1.0000 | ORAL_TABLET | Freq: Two times a day (BID) | ORAL | Status: AC | PRN

## 2012-05-08 MED ORDER — AMLODIPINE BESYLATE 5 MG PO TABS: 5.0000 mg | ORAL_TABLET | Freq: Every day | ORAL | Status: AC

## 2012-05-08 MED ORDER — MOXIFLOXACIN HCL 400 MG PO TABS: 400.0000 mg | ORAL_TABLET | Freq: Every day | ORAL | Status: AC

## 2012-05-08 MED ORDER — MOXIFLOXACIN HCL 400 MG PO TABS
400.0000 mg | ORAL_TABLET | Freq: Every day | ORAL | Status: DC
  Administered 2012-05-08: 400 mg via ORAL
  Filled 2012-05-08: qty 1

## 2012-05-08 MED ORDER — OXYCODONE-ACETAMINOPHEN 5-325 MG PO TABS
1.0000 | ORAL_TABLET | Freq: Once | ORAL | Status: AC
  Administered 2012-05-08: 1 via ORAL
  Filled 2012-05-08: qty 1

## 2012-05-08 NOTE — Discharge Summary (Signed)
Physician Discharge Summary  Patient ID: [name] MRN: [id number] DOB/AGE: 10/04/1930 78 y.o.  Admit date: 05/05/2012 Discharge date: 05/08/2012  Admission Diagnoses: COPD (chronic obstructive pulmonary disease) Permanent atrial fibrillation CAD (coronary artery disease) Hypertension Tobacco abuse  Discharge Diagnoses:  Active Problems: *Acute exacerbation of COPD (chronic obstructive pulmonary disease)* Principle problem Permanent atrial fibrillation Permanent Pacemaker CAD (coronary artery disease) Hypertension Tobacco abuse   Discharged Condition: good  Hospital Course: 78 years old male felt weak and dizzy post visit to [facility] earlier in the day on 05/05/2012. He was treated with IV fluids and IV followed by PO antibiotic and he felt better. He was discharged home in stable condition.  Consults: None  Significant Diagnostic Studies: labs: Elevated WBC count on admission improved to normal range. Normal eletrolytes and LFT. INR-3.02  EKG-A. Fibrillation and V-paced rhythm.  Radiology: CXR: Chronic lung changes with severe emphysematous disease. No evidence for new airspace disease.  CT scan of Head: No acute intracranial abnormality. Chronic white matter disease, possibly representing chronic small vessel ischemic changes. Stable encephalomalacia in the left occipital lobe and evidence of old lacunar injuries.  Left hip x-ray unremarkable for bony abnormality.  Treatments: IV hydration, antibiotics: ceftriaxone, analgesia: Vicodin and cardiac meds: lisinopril (Prinivil), carvedilol and amlodipine  Discharge Exam: Blood pressure 158/76, pulse 78, temperature 97.3 F (36.3 C), temperature source Oral, resp. rate 18, height 6\' 1"  (1.854 m), weight 74.345 kg (163 lb 14.4 oz), SpO2 93.00%. Gen; Tall, poorly nourished. HEENT: [place]/AT, blue eyes, Conjunctiva pink. Sclera- non-icteric, wears glasses.  NECK: Supple. No JVD.  LUNGS: Decreased breath sounds on both base  along without rhonchi and rales.  HEART: Irregularly irregular rhythm. Soft systolic murmur.  ABDOMEN: Soft. Bowel sounds present, nontender.  EXTREMITIES: No edema, cyanosis, or clubbing.  CNS:- Grossly intact. Moves all 4 extremities.   Disposition: 01-Home or Self Care  Discharge Orders    Future Appointments: Provider: Department: Dept Phone: Center:   06/08/2012 8:00 AM [facility] [phone] [facility]   10/28/2012 9:30 AM Sherrie George, MD [facility] [phone] None       Medication List     As of 05/08/2012  5:32 PM  Avelox 400 mg. One daily x 1 week.  TAKE these medications         amLODipine 5 MG tablet   Commonly known as: NORVASC   Take 1 tablet (5 mg total) by mouth daily.      atorvastatin 20 MG tablet   Commonly known as: LIPITOR   Take 20 mg by mouth daily.      CALCIUM-VITAMIN D PO   Take 1 tablet by mouth daily.      carvedilol 3.125 MG tablet   Commonly known as: COREG   Take 3.125 mg by mouth 2 (two) times daily with a meal.      furosemide 20 MG tablet   Commonly known as: LASIX   Take 20 mg by mouth daily.      HYDROcodone-acetaminophen 5-500 MG per tablet   Commonly known as: VICODIN   Take 1 tablet by mouth every 12 (twelve) hours as needed for pain.      lisinopril 5 MG tablet   Commonly known as: PRINIVIL,ZESTRIL   Take 5 mg by mouth daily.      losartan 50 MG tablet   Commonly known as: COZAAR   Take 50 mg by mouth daily.      multivitamin with minerals Tabs  Take 1 tablet by mouth daily.      nitroGLYCERIN 0.4 MG SL tablet   Commonly known as: NITROSTAT   Place 0.4 mg under the tongue every 5 (five) minutes as needed. For chest pain      pantoprazole 40 MG tablet   Commonly known as: PROTONIX   Take 40 mg by mouth daily.      tiotropium 18 MCG inhalation capsule   Commonly known as: SPIRIVA   Place 1 capsule (18 mcg total) into inhaler and inhale daily.      warfarin  7.5 MG tablet   Commonly known as: COUMADIN   Take 3.75-7.5 mg by mouth daily. Sundays-0.5 tablet  All other days-1 tablet           Follow-up Information    Follow up with [facility] S, MD. Schedule an appointment as soon as possible for a visit in 2 weeks.   Contact information:   [address] [place] [address] [phone]          Signed: Ricki Rodriguez 05/08/2012, 5:32 PM

## 2012-05-08 NOTE — Progress Notes (Signed)
Physical Therapy Treatment Patient Details Name: [name] MRN: [id number] DOB: Jul 31, 1931 Today's Date: 05/08/2012 Time: 1191-4782 PT Time Calculation (min): 14 min  PT Assessment / Plan / Recommendation Comments on Treatment Session  pt presents with COPD and today with L hip pain.  pt agreeable to ambulate despite painful L hip.  pt mobility limited today by hip pain and causing decreased balance.      Follow Up Recommendations  Home health PT;Supervision - Intermittent    Barriers to Discharge        Equipment Recommendations  None recommended by PT    Recommendations for Other Services OT consult  Frequency Min 3X/week   Plan Discharge plan remains appropriate;Frequency remains appropriate    Precautions / Restrictions Precautions Precautions: Fall Restrictions Weight Bearing Restrictions: No   Pertinent Vitals/Pain Indicates L hip pain with mobility, but does not rate.      Mobility  Bed Mobility Bed Mobility: Not assessed Transfers Transfers: Sit to Stand;Stand to Sit Sit to Stand: 5: Supervision;From bed;With upper extremity assist Stand to Sit: 5: Supervision;With upper extremity assist;To bed Ambulation/Gait Ambulation/Gait Assistance: 4: Min assist Ambulation Distance (Feet): 120 Feet Assistive device: None Ambulation/Gait Assistance Details: pt antalgic today L LE.  pt indicating L hip quite painful and unsure why.  pt needed occasional use of rail in hallway for support.   Gait Pattern: Step-through pattern;Decreased step length - right;Decreased stance time - left;Antalgic Stairs: No Wheelchair Mobility Wheelchair Mobility: No    Exercises     PT Diagnosis:    PT Problem List:   PT Treatment Interventions:     PT Goals Acute Rehab PT Goals Time For Goal Achievement: 05/21/12 PT Goal: Sit to Stand - Progress: Progressing toward goal PT Goal: Stand to Sit - Progress: Progressing toward goal PT Goal: Ambulate - Progress: Not progressing  Visit  Information  Last PT Received On: 05/08/12 Assistance Needed: +1    Subjective Data  Subjective: MY hip wasn't hurting like this yesterday.     Cognition  Overall Cognitive Status: Appears within functional limits for tasks assessed/performed Arousal/Alertness: Awake/alert Orientation Level: Appears intact for tasks assessed Behavior During Session: [facility] for tasks performed    Balance  Balance Balance Assessed: No  End of Session PT - End of Session Equipment Utilized During Treatment: Gait belt Activity Tolerance: Patient limited by pain Patient left: in bed;with call bell/phone within reach;with family/visitor present (Sitting EOB) Nurse Communication: Mobility status   GP     Sunny Schlein, [place] [phone] 05/08/2012, 1:47 PM

## 2012-05-08 NOTE — Progress Notes (Signed)
DC orders received.  Patient stable with no S/S of distress.  Discharge medication and information reviewed with patient and wife.  Patient DC home. [name]

## 2012-05-12 LAB — CULTURE, BLOOD (ROUTINE X 2): Culture: NO GROWTH

## 2012-06-08 ENCOUNTER — Ambulatory Visit (INDEPENDENT_AMBULATORY_CARE_PROVIDER_SITE_OTHER): Payer: Medicare Other | Admitting: *Deleted

## 2012-06-08 DIAGNOSIS — Z950 Presence of cardiac pacemaker: Secondary | ICD-10-CM

## 2012-06-08 DIAGNOSIS — I442 Atrioventricular block, complete: Secondary | ICD-10-CM

## 2012-06-09 ENCOUNTER — Encounter: Payer: Self-pay | Admitting: *Deleted

## 2012-06-09 ENCOUNTER — Encounter: Payer: Self-pay | Admitting: Internal Medicine

## 2012-06-09 DIAGNOSIS — Z950 Presence of cardiac pacemaker: Secondary | ICD-10-CM | POA: Insufficient documentation

## 2012-06-09 LAB — REMOTE PACEMAKER DEVICE
BRDY-0002RV: 60 {beats}/min
RV LEAD AMPLITUDE: 12 mv
RV LEAD IMPEDENCE PM: 690 Ohm
RV LEAD THRESHOLD: 0.5 V

## 2012-06-11 ENCOUNTER — Encounter: Payer: Self-pay | Admitting: *Deleted

## 2012-06-23 ENCOUNTER — Ambulatory Visit (INDEPENDENT_AMBULATORY_CARE_PROVIDER_SITE_OTHER): Payer: Medicare Other | Admitting: Emergency Medicine

## 2012-06-23 ENCOUNTER — Encounter: Payer: Self-pay | Admitting: Emergency Medicine

## 2012-06-23 VITALS — BP 130/80 | HR 66 | Temp 97.5°F | Ht 73.0 in | Wt 167.6 lb

## 2012-06-23 DIAGNOSIS — J449 Chronic obstructive pulmonary disease, unspecified: Secondary | ICD-10-CM

## 2012-06-23 DIAGNOSIS — Z720 Tobacco use: Secondary | ICD-10-CM

## 2012-06-23 DIAGNOSIS — F17200 Nicotine dependence, unspecified, uncomplicated: Secondary | ICD-10-CM

## 2012-06-23 NOTE — Patient Instructions (Addendum)
Please continue your Spiriva every day Use albuterol 2 puffs if needed Follow with Dr Delton Coombes in 6 months or sooner if you have any problems

## 2012-06-23 NOTE — Assessment & Plan Note (Signed)
Discussed cessation - he is trying to stop on his own, not interested in classes or meds at this time.

## 2012-06-23 NOTE — Progress Notes (Signed)
  Subjective:    Patient ID: [name], male    DOB: 13-Sep-1930, 78 y.o.   MRN: [id number] HPI 78 yo man with hx COPD, active smoker,  followed by Dr Algie Coffer. Also with CAD, A Fib. Seen by pulmonary for Pneumothorax 01/2011   02/06/11 Post Hospital   He was admitted and treated for spontaneous R PTX. Discharged with resolution of PTX, but only on prn xopenex as his BD. He has remained SOB, not using the xopenex. Still not near prior baseline (before PTX).   02/21/11 Post Hospital  Readmitted for for recurrent right pnuemothorax. He required a chest tube. CXR prior to discharged with improving PTX.   Today chest xray shows resolved PTX.  Since discharge he feels he is better with less dyspnea and right sided pain.  He has not quit smoking we discussed smoking cesstation.   ROV 03/05/11 - follows up for severe COPD and R PTX. He was readmitted as above for recurrence, now s/p another chest tube. Using albuterol prn about twice a day, xopenex prn. Just started Spiriva on 6/28 - he thinks it is helping. Continues to smoke 4 -5 a day, wants to try to stop. Was d/c to home on O2. CXR today with small residual R PTX, no change from 6/27 but more noticeable than 6/22 discharge. His breathing has not decompensated as in the past when he had recurrent ptx.   ROV 03/29/11 -- severe COPD and R PTX. Follows up after PFT 7/25 - FEV1 40% predicted. Currently on Spiriva + SABA. Continues to have his baseline SOB. He ran out of Spiriva and hasn't been taking.   ROV 09/26/11 -- severe COPD, hx R PTX, on Spiriva. Since last visit his O2 was stopped after he underwent reassuring walking oximetry. Denies CP, does wheeze but no different than usual, some cough. He was treated for PNA this fall, admitted to [facility].   ROV 03/25/12 -- severe COPD, hx R PTX, on Spiriva. Still smoking 1 pk/week. He was hospitalized once since last time for his heart valve. Has caused him to slow down his activity - no longer doing yard work. Takes  Spiriva daily.   ROV 06/23/12 -- severe COPD, hx R PTX, on Spiriva. Still smoking 1 pk/week. He is the same as last visit - no real wheeze or cough, able to exert. No exacerbations. Has had his flu shot.      Objective:   Physical Exam Filed Vitals:   06/23/12 1107  BP: 130/80  Pulse: 66  Temp: 97.5 F (36.4 C)    GEN: A/Ox3; pleasant , NAD, thin  HEENT:  [place]/AT,  EACs-clear, TMs-wnl, NOSE-clear, THROAT-clear, no lesions, no postnasal drip or exudate noted.   NECK:  Supple w/ fair ROM; no JVD; normal carotid impulses w/o bruits; no thyromegaly or nodules palpated; no lymphadenopathy.  RESP  Coarse BS w/ diminshed bs in bases no accessory muscle use, no dullness to percussion, no wheeze  CARD:  RRR, no m/r/g  , no peripheral edema, pulses intact, no cyanosis or clubbing.  Musco: Warm bil, no deformities or joint swelling noted.   Neuro: alert, no focal deficits noted.    Skin: Warm, no lesions or rashes   Assessment & Plan:  Tobacco abuse Discussed cessation - he is trying to stop on his own, not interested in classes or meds at this time.   COPD (chronic obstructive pulmonary disease) No change in meds rov 6 months

## 2012-06-23 NOTE — Assessment & Plan Note (Signed)
No change in meds rov 6 months

## 2012-09-14 ENCOUNTER — Ambulatory Visit (INDEPENDENT_AMBULATORY_CARE_PROVIDER_SITE_OTHER): Payer: Medicare Other | Admitting: *Deleted

## 2012-09-14 DIAGNOSIS — I442 Atrioventricular block, complete: Secondary | ICD-10-CM

## 2012-09-14 DIAGNOSIS — Z950 Presence of cardiac pacemaker: Secondary | ICD-10-CM

## 2012-09-14 LAB — REMOTE PACEMAKER DEVICE
BMOD-0002RV: 12
BRDY-0002RV: 60 {beats}/min
BRDY-0004RV: 120 {beats}/min
DEVICE MODEL PM: 7084939
VENTRICULAR PACING PM: 100

## 2012-09-22 ENCOUNTER — Encounter: Payer: Self-pay | Admitting: *Deleted

## 2012-10-01 ENCOUNTER — Encounter: Payer: Self-pay | Admitting: Internal Medicine

## 2012-10-28 ENCOUNTER — Ambulatory Visit (INDEPENDENT_AMBULATORY_CARE_PROVIDER_SITE_OTHER): Payer: Medicare Other | Admitting: Ophthalmology

## 2012-10-28 DIAGNOSIS — H35379 Puckering of macula, unspecified eye: Secondary | ICD-10-CM

## 2012-11-18 ENCOUNTER — Telehealth: Payer: Self-pay | Admitting: Internal Medicine

## 2012-11-18 NOTE — Telephone Encounter (Signed)
New problem    Pt is changing phone companies and needs to know if his device works w/voice over [employer]

## 2012-11-19 ENCOUNTER — Emergency Department (HOSPITAL_COMMUNITY): Payer: Medicare Other

## 2012-11-19 ENCOUNTER — Inpatient Hospital Stay (HOSPITAL_COMMUNITY)
Admission: EM | Admit: 2012-11-19 | Discharge: 2012-11-22 | DRG: 195 | Disposition: A | Discharge status: Home Health | Payer: Medicare Other | Attending: Cardiovascular Disease | Admitting: Cardiovascular Disease

## 2012-11-19 ENCOUNTER — Encounter (HOSPITAL_COMMUNITY): Payer: Self-pay | Admitting: *Deleted

## 2012-11-19 DIAGNOSIS — Z7901 Long term (current) use of anticoagulants: Secondary | ICD-10-CM

## 2012-11-19 DIAGNOSIS — Z8546 Personal history of malignant neoplasm of prostate: Secondary | ICD-10-CM

## 2012-11-19 DIAGNOSIS — I10 Essential (primary) hypertension: Secondary | ICD-10-CM | POA: Diagnosis present

## 2012-11-19 DIAGNOSIS — Z8673 Personal history of transient ischemic attack (TIA), and cerebral infarction without residual deficits: Secondary | ICD-10-CM

## 2012-11-19 DIAGNOSIS — F17200 Nicotine dependence, unspecified, uncomplicated: Secondary | ICD-10-CM | POA: Diagnosis present

## 2012-11-19 DIAGNOSIS — J4489 Other specified chronic obstructive pulmonary disease: Secondary | ICD-10-CM | POA: Diagnosis present

## 2012-11-19 DIAGNOSIS — R791 Abnormal coagulation profile: Secondary | ICD-10-CM | POA: Diagnosis present

## 2012-11-19 DIAGNOSIS — Z950 Presence of cardiac pacemaker: Secondary | ICD-10-CM

## 2012-11-19 DIAGNOSIS — J189 Pneumonia, unspecified organism: Principal | ICD-10-CM | POA: Diagnosis present

## 2012-11-19 DIAGNOSIS — J449 Chronic obstructive pulmonary disease, unspecified: Secondary | ICD-10-CM

## 2012-11-19 DIAGNOSIS — Z79899 Other long term (current) drug therapy: Secondary | ICD-10-CM

## 2012-11-19 DIAGNOSIS — I4891 Unspecified atrial fibrillation: Secondary | ICD-10-CM | POA: Diagnosis present

## 2012-11-19 DIAGNOSIS — Z951 Presence of aortocoronary bypass graft: Secondary | ICD-10-CM

## 2012-11-19 DIAGNOSIS — I2510 Atherosclerotic heart disease of native coronary artery without angina pectoris: Secondary | ICD-10-CM | POA: Diagnosis present

## 2012-11-19 DIAGNOSIS — D72829 Elevated white blood cell count, unspecified: Secondary | ICD-10-CM

## 2012-11-19 LAB — BASIC METABOLIC PANEL
BUN: 25 mg/dL — ABNORMAL HIGH (ref 6–23)
Creatinine, Ser: 1.15 mg/dL (ref 0.50–1.35)
GFR calc Af Amer: 67 mL/min — ABNORMAL LOW (ref >90)
GFR calc non Af Amer: 58 mL/min — ABNORMAL LOW (ref >90)
Glucose, Bld: 117 mg/dL — ABNORMAL HIGH (ref 70–99)

## 2012-11-19 LAB — POCT I-STAT TROPONIN I: Troponin i, poc: 0.04 ng/mL (ref 0.00–0.08)

## 2012-11-19 LAB — CBC
HCT: 40.7 % (ref 39.0–52.0)
Hemoglobin: 13.5 g/dL (ref 13.0–17.0)
MCH: 29.2 pg (ref 26.0–34.0)
MCHC: 33.2 g/dL (ref 30.0–36.0)
RDW: 14 % (ref 11.5–15.5)

## 2012-11-19 LAB — PRO B NATRIURETIC PEPTIDE: Pro B Natriuretic peptide (BNP): 688.3 pg/mL — ABNORMAL HIGH (ref 0–450)

## 2012-11-19 MED ORDER — ALBUTEROL SULFATE (5 MG/ML) 0.5% IN NEBU
5.0000 mg | INHALATION_SOLUTION | Freq: Once | RESPIRATORY_TRACT | Status: AC
  Administered 2012-11-19: 5 mg via RESPIRATORY_TRACT
  Filled 2012-11-19: qty 1

## 2012-11-19 MED ORDER — LEVOFLOXACIN IN D5W 750 MG/150ML IV SOLN
750.0000 mg | INTRAVENOUS | Status: DC
  Administered 2012-11-19: 750 mg via INTRAVENOUS
  Filled 2012-11-19 (×2): qty 150

## 2012-11-19 MED ORDER — FUROSEMIDE 20 MG PO TABS
20.0000 mg | ORAL_TABLET | Freq: Every day | ORAL | Status: DC
  Administered 2012-11-20 – 2012-11-22 (×3): 20 mg via ORAL
  Filled 2012-11-19 (×3): qty 1

## 2012-11-19 MED ORDER — METHYLPREDNISOLONE SODIUM SUCC 125 MG IJ SOLR
125.0000 mg | Freq: Once | INTRAMUSCULAR | Status: AC
  Administered 2012-11-19: 125 mg via INTRAVENOUS
  Filled 2012-11-19: qty 2

## 2012-11-19 MED ORDER — ACETAMINOPHEN 325 MG PO TABS
650.0000 mg | ORAL_TABLET | Freq: Once | ORAL | Status: AC
  Administered 2012-11-19: 650 mg via ORAL
  Filled 2012-11-19: qty 2

## 2012-11-19 MED ORDER — ALUM & MAG HYDROXIDE-SIMETH 200-200-20 MG/5ML PO SUSP: 30.0000 mL | Freq: Four times a day (QID) | ORAL | Status: DC | PRN

## 2012-11-19 MED ORDER — PANTOPRAZOLE SODIUM 40 MG PO TBEC
40.0000 mg | DELAYED_RELEASE_TABLET | Freq: Every day | ORAL | Status: DC
  Administered 2012-11-20 – 2012-11-22 (×3): 40 mg via ORAL
  Filled 2012-11-19 (×2): qty 1

## 2012-11-19 MED ORDER — GUAIFENESIN-DM 100-10 MG/5ML PO SYRP: 5.0000 mL | ORAL_SOLUTION | ORAL | Status: DC | PRN

## 2012-11-19 MED ORDER — ONDANSETRON HCL 4 MG PO TABS: 4.0000 mg | ORAL_TABLET | Freq: Four times a day (QID) | ORAL | Status: DC | PRN

## 2012-11-19 MED ORDER — SODIUM CHLORIDE 0.9 % IV SOLN
INTRAVENOUS | Status: DC
  Administered 2012-11-19 – 2012-11-22 (×3): via INTRAVENOUS

## 2012-11-19 MED ORDER — ADULT MULTIVITAMIN W/MINERALS CH
1.0000 | ORAL_TABLET | Freq: Every day | ORAL | Status: DC
  Administered 2012-11-20 – 2012-11-22 (×3): 1 via ORAL
  Filled 2012-11-19 (×3): qty 1

## 2012-11-19 MED ORDER — LOSARTAN POTASSIUM 50 MG PO TABS
50.0000 mg | ORAL_TABLET | Freq: Every evening | ORAL | Status: DC
  Administered 2012-11-20: 50 mg via ORAL
  Filled 2012-11-19 (×2): qty 1

## 2012-11-19 MED ORDER — ACETAMINOPHEN 650 MG RE SUPP: 650.0000 mg | Freq: Four times a day (QID) | RECTAL | Status: DC | PRN

## 2012-11-19 MED ORDER — ACETAMINOPHEN 325 MG PO TABS: 650.0000 mg | ORAL_TABLET | Freq: Four times a day (QID) | ORAL | Status: DC | PRN

## 2012-11-19 MED ORDER — IPRATROPIUM BROMIDE 0.02 % IN SOLN
0.5000 mg | Freq: Once | RESPIRATORY_TRACT | Status: AC
  Administered 2012-11-19: 0.5 mg via RESPIRATORY_TRACT
  Filled 2012-11-19: qty 2.5

## 2012-11-19 MED ORDER — SIMVASTATIN 10 MG PO TABS
10.0000 mg | ORAL_TABLET | Freq: Every day | ORAL | Status: DC
  Administered 2012-11-20 – 2012-11-21 (×2): 10 mg via ORAL
  Filled 2012-11-19 (×3): qty 1

## 2012-11-19 MED ORDER — CARVEDILOL 3.125 MG PO TABS
3.1250 mg | ORAL_TABLET | Freq: Two times a day (BID) | ORAL | Status: DC
  Administered 2012-11-20 – 2012-11-22 (×5): 3.125 mg via ORAL
  Filled 2012-11-19 (×7): qty 1

## 2012-11-19 MED ORDER — HEPARIN SODIUM (PORCINE) 5000 UNIT/ML IJ SOLN
5000.0000 [IU] | Freq: Three times a day (TID) | INTRAMUSCULAR | Status: DC
  Administered 2012-11-20 (×2): 5000 [IU] via SUBCUTANEOUS
  Filled 2012-11-19 (×5): qty 1

## 2012-11-19 MED ORDER — ONDANSETRON HCL 4 MG/2ML IJ SOLN: 4.0000 mg | Freq: Four times a day (QID) | INTRAMUSCULAR | Status: DC | PRN

## 2012-11-19 MED ORDER — WARFARIN SODIUM 5 MG PO TABS
5.0000 mg | ORAL_TABLET | Freq: Every day | ORAL | Status: DC
  Administered 2012-11-20 (×2): 5 mg via ORAL
  Filled 2012-11-19 (×2): qty 1

## 2012-11-19 MED ORDER — ALBUTEROL SULFATE (5 MG/ML) 0.5% IN NEBU
2.5000 mg | INHALATION_SOLUTION | Freq: Four times a day (QID) | RESPIRATORY_TRACT | Status: DC
  Administered 2012-11-20 – 2012-11-22 (×10): 2.5 mg via RESPIRATORY_TRACT
  Filled 2012-11-19 (×8): qty 0.5

## 2012-11-19 MED ORDER — AMLODIPINE BESYLATE 5 MG PO TABS
5.0000 mg | ORAL_TABLET | Freq: Every day | ORAL | Status: DC
  Administered 2012-11-20: 5 mg via ORAL
  Filled 2012-11-19 (×2): qty 1

## 2012-11-19 MED ORDER — IPRATROPIUM BROMIDE 0.02 % IN SOLN
0.5000 mg | Freq: Four times a day (QID) | RESPIRATORY_TRACT | Status: DC
  Administered 2012-11-20 – 2012-11-22 (×10): 0.5 mg via RESPIRATORY_TRACT
  Filled 2012-11-19 (×8): qty 2.5

## 2012-11-19 MED ORDER — NITROGLYCERIN 0.4 MG SL SUBL: 0.4000 mg | SUBLINGUAL_TABLET | SUBLINGUAL | Status: DC | PRN

## 2012-11-19 MED ORDER — DOCUSATE SODIUM 100 MG PO CAPS
100.0000 mg | ORAL_CAPSULE | Freq: Two times a day (BID) | ORAL | Status: DC
  Administered 2012-11-20 – 2012-11-22 (×6): 100 mg via ORAL
  Filled 2012-11-19 (×7): qty 1

## 2012-11-19 NOTE — Telephone Encounter (Signed)
LMOM for pt in regards to voice over IP phone company. Not compatible with [employer]

## 2012-11-19 NOTE — ED Notes (Signed)
Pt wife states he has had a busy day, did yard work in the morning and took a nap today.  Around 5 pm, pt c/o chills, weakness, SOB.  Used inhaler around 5:45.  Pt has not eaten this weekend.

## 2012-11-19 NOTE — ED Notes (Signed)
Pt had to be pulled from car with 2 person assist.  Wife states pt was able to walk to car at home, but unable once he got here.  Does not walk with cane or walker at home.  Pt SOB, placed on 2L O2 by nasal cannula

## 2012-11-19 NOTE — H&P (Signed)
[name] is an 79 y.o. male.   Chief Complaint: Chills and shortness of breath HPI: 79 years old male with one day history of chills, weakness and shortness of breath without chest pain. Chest x-ray positive for left lung pneumonia with bad COPD. Has mild fever and cough.   Past Medical History  Diagnosis Date  . COPD (chronic obstructive pulmonary disease)   . Prostate cancer 2003  . Stroke 2000  . CAD (coronary artery disease)   . AF (atrial fibrillation)     permanent  . HTN (hypertension)   . Pneumothorax, right   . Complete heart block     S/P pacemaker insertion// St. Jude Accent RFSR  . Pacemaker   . CHF (congestive heart failure)   . Pneumonia     hx of on right   . Tobacco abuse       Past Surgical History  Procedure Laterality Date  . Coronary artery bypass graft  2003  . Prostatectomy  2003  . Appendectomy    . Eye surgery      bilateral for gaze problem  . Pacemaker insertion  11/06/10    St jude pacemaker implanted by Dr Johney Frame for complete heart block  . Cholecystectomy      Family History  Problem Relation Age of Onset  . Heart attack Father     died age 70  . Heart disease Mother   . Heart disease Sister   . Heart disease Brother   . Multiple sclerosis Mother     died age 39  . Kidney failure Brother    Social History:  reports that he has been smoking Cigarettes.  He has a 120 pack-year smoking history. He has never used smokeless tobacco. He reports that he does not drink alcohol or use illicit drugs.  Allergies: No Known Allergies   (Not in a hospital admission)  Results for orders placed during the hospital encounter of 11/19/12 (from the past 48 hour(s))  BASIC METABOLIC PANEL     Status: Abnormal   Collection Time    11/19/12  7:54 PM      Result Value Range   Sodium 140  135 - 145 mEq/L   Potassium 4.9  3.5 - 5.1 mEq/L   Chloride 102  96 - 112 mEq/L   CO2 28  19 - 32 mEq/L   Glucose, Bld 117 (*) 70 - 99 mg/dL   BUN 25 (*) 6 -  23 mg/dL   Creatinine, Ser 1.61  0.50 - 1.35 mg/dL   Calcium 9.6  8.4 - 09.6 mg/dL   GFR calc non Af Amer 58 (*) >90 mL/min   GFR calc Af Amer 67 (*) >90 mL/min   Comment:            The eGFR has been calculated     using the CKD EPI equation.     This calculation has not been     validated in all clinical     situations.     eGFR's persistently     <90 mL/min signify     possible Chronic Kidney Disease.  CBC     Status: Abnormal   Collection Time    11/19/12  7:54 PM      Result Value Range   WBC 14.0 (*) 4.0 - 10.5 K/uL   RBC 4.63  4.22 - 5.81 MIL/uL   Hemoglobin 13.5  13.0 - 17.0 g/dL   HCT 04.5  40.9 - 81.1 %  MCV 87.9  78.0 - 100.0 fL   MCH 29.2  26.0 - 34.0 pg   MCHC 33.2  30.0 - 36.0 g/dL   RDW 40.9  81.1 - 91.4 %   Platelets 220  150 - 400 K/uL  POCT I-STAT TROPONIN I     Status: None   Collection Time    11/19/12  8:10 PM      Result Value Range   Troponin i, poc 0.04  0.00 - 0.08 ng/mL   Comment 3            Comment: Due to the release kinetics of cTnI,     a negative result within the first hours     of the onset of symptoms does not rule out     myocardial infarction with certainty.     If myocardial infarction is still suspected,     repeat the test at appropriate intervals.  PRO B NATRIURETIC PEPTIDE     Status: Abnormal   Collection Time    11/19/12  8:46 PM      Result Value Range   Pro B Natriuretic peptide (BNP) 688.3 (*) 0 - 450 pg/mL  PROTIME-INR     Status: Abnormal   Collection Time    11/19/12  8:46 PM      Result Value Range   Prothrombin Time 27.7 (*) 11.6 - 15.2 seconds   INR 2.75 (*) 0.00 - 1.49  CG4 I-STAT (LACTIC ACID)     Status: None   Collection Time    11/19/12  9:06 PM      Result Value Range   Lactic Acid, Venous 2.01  0.5 - 2.2 mmol/L   Dg Chest 2 View (if Patient Has Fever And/or Copd)  11/19/2012  *RADIOLOGY REPORT*  Clinical Data: Shortness of breath.  Weakness.  Fatigue.  CHEST - 2 VIEW  Comparison: 05/05/2012   Findings: Cardiomegaly noted with chronic interstitial accentuation, particularly course in the upper lobes.  Prior CABG noted.  Single lead pacer remains in place.  Left costophrenic angle was omitted.  There is increased airspace opacity in the left lower lobe.  Severe emphysema is present with biapical scarring.  IMPRESSION:  1.  Left lower lobe airspace opacity, suspicious for pneumonia. Follow-up imaging to ensure clearance recommended. 2.  Cardiomegaly with interstitial accentuation.  Cannot exclude interstitial edema although much of this appearance is due to scattered scarring and severe emphysema.   Original Report Authenticated By: Gaylyn Rong, M.D.     @ROS @ No weight gain or loss. + COPD, + Chest pain, + Shortness of breath, + Pacemaker. + Pneumonia, + Stroke and Ca prostate, + CAD, + arthritis. No GI bleed, Kidney stone, stroke or psychiatric admission.  Blood pressure 113/51, pulse 59, temperature 100.4 F (38 C), temperature source Oral, resp. rate 26, SpO2 96.00%. HEENT: [place]/AT, blue eyes, Conjunctiva pink. Sclera- non-icteric, wears glasses.  NECK: Supple. No JVD.  LUNGS: Decreased breath sounds on both base along with rhonchi and rales over left lung posteriorly.  HEART: Irregularly irregular rhythm. Soft systolic murmur.  ABDOMEN: Soft. Bowel sounds present, nontender.  EXTREMITIES: No edema, cyanosis, or clubbing.  CNS:- Grossly intact. Moves all 4 extremities.  SKIN: Warm and dry  Assessment/Plan Left lung pneumonia COPD  HTN  A. Fibrillation  Perm. Pacemaker implant.  CAD  Admit/O2/Antibiotic/Home medications/Nebulizer TX.  Shadrick Senne S 11/19/2012, 11:30 PM

## 2012-11-19 NOTE — ED Provider Notes (Signed)
History     CSN: [id number]  Arrival date & time 11/19/12  1942   First MD Initiated Contact with Patient 11/19/12 2024      Chief Complaint  Patient presents with  . Fatigue    (Consider location/radiation/quality/duration/timing/severity/associated sxs/prior treatment) HPI Pt with hx of COPD, CAD, atrial fibrillation presents with sob and fatigue.  He has felt sob worsening today.  No chest pain, no faitning or leg swelling.  No fever/chills.  He has developed a productive cough.  Has taken his COPD inhalers today without much improvement in symptoms.  SOB worsens with exertion.  Wife states he has been taking other medications as prescribed.  Has had decreased appetite over the past couple of days as well.  There are no other associated systemic symptoms, there are no other alleviating or modifying factors.   Past Medical History  Diagnosis Date  . COPD (chronic obstructive pulmonary disease)   . Prostate cancer 2003  . Stroke 2000  . CAD (coronary artery disease)   . AF (atrial fibrillation)     permanent  . HTN (hypertension)   . Pneumothorax, right   . Complete heart block     S/P pacemaker insertion// [employer] RFSR  . Pacemaker   . CHF (congestive heart failure)   . Pneumonia     hx of on right   . Tobacco abuse     Past Surgical History  Procedure Laterality Date  . Coronary artery bypass graft  2003  . Prostatectomy  2003  . Appendectomy    . Eye surgery      bilateral for gaze problem  . Pacemaker insertion  11/06/10    St jude pacemaker implanted by Dr Johney Frame for complete heart block  . Cholecystectomy      Family History  Problem Relation Age of Onset  . Heart attack Father     died age 23  . Heart disease Mother   . Heart disease Sister   . Heart disease Brother   . Multiple sclerosis Mother     died age 49  . Kidney failure Brother     History  Substance Use Topics  . Smoking status: Current Every Day Smoker -- 2.00 packs/day for 60  years    Types: Cigarettes  . Smokeless tobacco: Never Used     Comment: pt down to 5 ciagrettes daily  . Alcohol Use: No      Review of Systems ROS reviewed and all otherwise negative except for mentioned in HPI  Allergies  Review of patient's allergies indicates no known allergies.  Home Medications   Current Outpatient Rx  Name  Route  Sig  Dispense  Refill  . amLODipine (NORVASC) 5 MG tablet   Oral   Take 1 tablet (5 mg total) by mouth daily.   30 tablet   3   . CALCIUM-VITAMIN D PO   Oral   Take 1 tablet by mouth daily.         . carvedilol (COREG) 3.125 MG tablet   Oral   Take 3.125 mg by mouth 2 (two) times daily with a meal.         . furosemide (LASIX) 20 MG tablet   Oral   Take 20 mg by mouth daily.         Marland Kitchen losartan (COZAAR) 50 MG tablet   Oral   Take 50 mg by mouth every evening.         . Multiple Vitamin (  MULITIVITAMIN WITH MINERALS) TABS   Oral   Take 1 tablet by mouth daily.         . nitroGLYCERIN (NITROSTAT) 0.4 MG SL tablet   Sublingual   Place 0.4 mg under the tongue every 5 (five) minutes as needed. For chest pain         . pantoprazole (PROTONIX) 40 MG tablet   Oral   Take 40 mg by mouth daily.         Marland Kitchen PRESCRIPTION MEDICATION   Inhalation   Inhale 2 puffs into the lungs 2 (two) times daily as needed (for shortness of breath or wheezing).         . simvastatin (ZOCOR) 10 MG tablet   Oral   Take 10 mg by mouth Daily.         Marland Kitchen tiotropium (SPIRIVA) 18 MCG inhalation capsule   Inhalation   Place 1 capsule (18 mcg total) into inhaler and inhale daily.   30 capsule   12   . warfarin (COUMADIN) 7.5 MG tablet   Oral   Take 3.75-7.5 mg by mouth every evening. 1/2 tablet (3.75 mg) on Sunday and Wednesday, 1 tablet (7.5 mg) on all other days           BP 113/51  Pulse 59  Temp(Src) 100.4 F (38 C) (Oral)  Resp 26  SpO2 96% Vitals reviewed Physical Exam Physical Examination: General appearance - alert,  well appearing, and in no distress Mental status - alert, oriented to person, place, and time Eyes - no conjunctival injection, no scleral icterus Mouth - mucous membranes moist, pharynx normal without lesions Chest - clear to auscultation, no wheezes, rales or rhonchi, symmetric air entry, increased work of breathing, speaking in 2-3 word sentences Heart - normal rate, regular rhythm, normal S1, S2, no murmurs, rubs, clicks or gallops Abdomen - soft, nontender, nondistended, no masses or organomegaly Extremities - peripheral pulses normal, no pedal edema, no clubbing or cyanosis Skin - normal coloration and turgor, no rashes  ED Course  Procedures (including critical care time)   Date: 11/19/2012  Rate: 63 Rhythm: paced rhythm  QRS Axis: indeterminate  Intervals: paced  ST/T Wave abnormalities: nonspecific ST/T changes  Conduction Disutrbances:paced rhythm  Narrative Interpretation:   Old EKG Reviewed: unchanged compared to prior ekg of 05/05/12  10:55 PM d/w Dr. Algie Coffer, he will see patient in the ED for admission.    Labs Reviewed  BASIC METABOLIC PANEL - Abnormal; Notable for the following:    Glucose, Bld 117 (*)    BUN 25 (*)    GFR calc non Af Amer 58 (*)    GFR calc Af Amer 67 (*)    All other components within normal limits  CBC - Abnormal; Notable for the following:    WBC 14.0 (*)    All other components within normal limits  PRO B NATRIURETIC PEPTIDE - Abnormal; Notable for the following:    Pro B Natriuretic peptide (BNP) 688.3 (*)    All other components within normal limits  PROTIME-INR - Abnormal; Notable for the following:    Prothrombin Time 27.7 (*)    INR 2.75 (*)    All other components within normal limits  CULTURE, BLOOD (ROUTINE X 2)  CULTURE, BLOOD (ROUTINE X 2)  BASIC METABOLIC PANEL  CBC  PROTIME-INR  POCT I-STAT TROPONIN I  CG4 I-STAT (LACTIC ACID)   Dg Chest 2 View (if Patient Has Fever And/or Copd)  11/19/2012  *RADIOLOGY REPORT*  Clinical Data: Shortness of breath.  Weakness.  Fatigue.  CHEST - 2 VIEW  Comparison: 05/05/2012  Findings: Cardiomegaly noted with chronic interstitial accentuation, particularly course in the upper lobes.  Prior CABG noted.  Single lead pacer remains in place.  Left costophrenic angle was omitted.  There is increased airspace opacity in the left lower lobe.  Severe emphysema is present with biapical scarring.  IMPRESSION:  1.  Left lower lobe airspace opacity, suspicious for pneumonia. Follow-up imaging to ensure clearance recommended. 2.  Cardiomegaly with interstitial accentuation.  Cannot exclude interstitial edema although much of this appearance is due to scattered scarring and severe emphysema.   Original Report Authenticated By: Gaylyn Rong, M.D.      1. Community acquired pneumonia   2. Leukocytosis       MDM  Pt presenting with c/o shortness of breath and weakness.  CXR shows LLL pneumonia.  Pt has received albuterol/atrovent nebs in the ED.  He feels much improved and respiratory effort is normal on recheck. Started on steroid as well as moxifloxacin.  Lactate 2, blood cultures obtained.  Pt updated about findings and plan for admission.  Dr. Algie Coffer in to see patient for admission.          Ethelda Chick, MD 11/19/12 [phone]

## 2012-11-20 ENCOUNTER — Encounter (HOSPITAL_COMMUNITY): Payer: Self-pay | Admitting: *Deleted

## 2012-11-20 LAB — CBC
MCHC: 33.6 g/dL (ref 30.0–36.0)
Platelets: 210 10*3/uL (ref 150–400)
RDW: 13.9 % (ref 11.5–15.5)
WBC: 20.2 10*3/uL — ABNORMAL HIGH (ref 4.0–10.5)

## 2012-11-20 LAB — BASIC METABOLIC PANEL
BUN: 32 mg/dL — ABNORMAL HIGH (ref 6–23)
Calcium: 9.1 mg/dL (ref 8.4–10.5)
Creatinine, Ser: 1.36 mg/dL — ABNORMAL HIGH (ref 0.50–1.35)
GFR calc Af Amer: 55 mL/min — ABNORMAL LOW (ref >90)
GFR calc non Af Amer: 48 mL/min — ABNORMAL LOW (ref >90)

## 2012-11-20 LAB — PROTIME-INR: INR: 3.03 — ABNORMAL HIGH (ref 0.00–1.49)

## 2012-11-20 MED ORDER — WARFARIN - PHYSICIAN DOSING INPATIENT
Freq: Every day | Status: DC
  Administered 2012-11-20 – 2012-11-21 (×2)

## 2012-11-20 MED ORDER — SODIUM CHLORIDE 0.9 % IJ SOLN
3.0000 mL | Freq: Two times a day (BID) | INTRAMUSCULAR | Status: DC
Start: 2012-11-20 — End: 2012-11-22
  Administered 2012-11-20 (×2): 3 mL via INTRAVENOUS

## 2012-11-20 MED ORDER — LEVOFLOXACIN IN D5W 750 MG/150ML IV SOLN: 750.0000 mg | INTRAVENOUS | Status: DC

## 2012-11-20 MED ORDER — LEVOFLOXACIN IN D5W 750 MG/150ML IV SOLN
750.0000 mg | INTRAVENOUS | Status: DC
  Administered 2012-11-20: 750 mg via INTRAVENOUS
  Filled 2012-11-20 (×2): qty 150

## 2012-11-20 NOTE — ED Notes (Signed)
Attempted to call report to [phone], nurse unavailable, was told he'll call back in 5 min.

## 2012-11-20 NOTE — Progress Notes (Signed)
Utilization Review Completed.   Jovann Luse, RN, BSN Nurse Case Manager  [phone]  

## 2012-11-20 NOTE — Progress Notes (Signed)
Subjective:  Feeling better with oxygen. No chest pain. T max 99.1 F  Objective:  Vital Signs in the last 24 hours: Temp:  [97.4 F (36.3 C)-99.1 F (37.3 C)] 97.7 F (36.5 C) (03/28 2031) Pulse Rate:  [59-73] 59 (03/28 2031) Cardiac Rhythm:  [-] Ventricular paced (03/28 1001) Resp:  [18-27] 20 (03/28 2031) BP: (89-135)/(36-68) 98/36 mmHg (03/28 2031) SpO2:  [95 %-99 %] 97 % (03/28 2031) Weight:  [73.9 kg (162 lb 14.7 oz)] 73.9 kg (162 lb 14.7 oz) (03/28 0033)  Physical Exam: BP Readings from Last 1 Encounters:  11/20/12 98/36     Wt Readings from Last 1 Encounters:  11/20/12 73.9 kg (162 lb 14.7 oz)    Weight change:   HEENT: [place]/AT, Eyes-Blue, PERL, EOMI, Conjunctiva-Pink, Sclera-Non-icteric Neck: No JVD, No bruit, Trachea midline. Lungs:  Clearing, Bilateral. Cardiac: Irregularly irregular rhythm, normal S1 and S2, no S3. II/VI systolic murmur. Abdomen:  Soft, non-tender. Extremities:  No edema present. No cyanosis. No clubbing. CNS: AxOx3, Cranial nerves grossly intact, moves all 4 extremities. Right handed. Skin: Warm and dry.   Intake/Output from previous day:      Lab Results: BMET    Component Value Date/Time   NA 138 11/20/2012 0540   K 4.3 11/20/2012 0540   CL 101 11/20/2012 0540   CO2 26 11/20/2012 0540   GLUCOSE 153* 11/20/2012 0540   BUN 32* 11/20/2012 0540   CREATININE 1.36* 11/20/2012 0540   CALCIUM 9.1 11/20/2012 0540   GFRNONAA 48* 11/20/2012 0540   GFRAA 55* 11/20/2012 0540   CBC    Component Value Date/Time   WBC 20.2* 11/20/2012 0540   RBC 4.00* 11/20/2012 0540   HGB 11.8* 11/20/2012 0540   HCT 35.1* 11/20/2012 0540   PLT 210 11/20/2012 0540   MCV 87.8 11/20/2012 0540   MCH 29.5 11/20/2012 0540   MCHC 33.6 11/20/2012 0540   RDW 13.9 11/20/2012 0540   LYMPHSABS 1.8 05/05/2012 1723   MONOABS 1.5* 05/05/2012 1723   EOSABS 0.1 05/05/2012 1723   BASOSABS 0.0 05/05/2012 1723   CARDIAC ENZYMES Lab Results  Component Value Date   CKTOTAL 87 01/08/2012   CKMB 5.9* 01/08/2012   TROPONINI <0.30 01/08/2012    Scheduled Meds: . albuterol  2.5 mg Nebulization Q6H  . amLODipine  5 mg Oral Daily  . carvedilol  3.125 mg Oral BID WC  . docusate sodium  100 mg Oral BID  . furosemide  20 mg Oral Daily  . ipratropium  0.5 mg Nebulization Q6H  . [START ON 11/21/2012] levofloxacin  750 mg Intravenous Q48H  . losartan  50 mg Oral QPM  . multivitamin with minerals  1 tablet Oral Daily  . pantoprazole  40 mg Oral Daily  . simvastatin  10 mg Oral q1800  . sodium chloride  3 mL Intravenous Q12H  . warfarin  5 mg Oral q1800  . Warfarin - Physician Dosing Inpatient   Does not apply q1800   Continuous Infusions: . sodium chloride 50 mL/hr at 11/20/12 2034   PRN Meds:.acetaminophen, acetaminophen, alum & mag hydroxide-simeth, guaiFENesin-dextromethorphan, nitroGLYCERIN, ondansetron (ZOFRAN) IV, ondansetron  Assessment/Plan: Left lung pneumonia  COPD  HTN  A. Fibrillation  Perm. Pacemaker implant.  CAD  Continue medical treatment    LOS: 1 day    Corey Cobb  MD  11/20/2012, 9:05 PM

## 2012-11-20 NOTE — Progress Notes (Signed)
Chart review complete.  Patient is not eligible for [facility] Care Management services because his PCP is not a [facility] primary care provider or is not [facility] affiliated.  For any additional questions or new referrals please contact Tim Henderson BSN RN [facility] Liaison at [phone] °

## 2012-11-21 LAB — BASIC METABOLIC PANEL
CO2: 24 mEq/L (ref 19–32)
Calcium: 9 mg/dL (ref 8.4–10.5)
Creatinine, Ser: 1.41 mg/dL — ABNORMAL HIGH (ref 0.50–1.35)
GFR calc non Af Amer: 45 mL/min — ABNORMAL LOW (ref >90)
Glucose, Bld: 106 mg/dL — ABNORMAL HIGH (ref 70–99)
Sodium: 136 mEq/L (ref 135–145)

## 2012-11-21 LAB — PROTIME-INR: Prothrombin Time: 40.4 seconds — ABNORMAL HIGH (ref 11.6–15.2)

## 2012-11-21 LAB — CBC
MCH: 29.5 pg (ref 26.0–34.0)
MCHC: 33.9 g/dL (ref 30.0–36.0)
MCV: 87.2 fL (ref 78.0–100.0)
Platelets: 199 10*3/uL (ref 150–400)
RBC: 3.66 MIL/uL — ABNORMAL LOW (ref 4.22–5.81)

## 2012-11-21 MED ORDER — PHYTONADIONE 5 MG PO TABS
2.5000 mg | ORAL_TABLET | Freq: Once | ORAL | Status: AC
  Administered 2012-11-21: 2.5 mg via ORAL
  Filled 2012-11-21: qty 1

## 2012-11-21 MED ORDER — AMLODIPINE BESYLATE 2.5 MG PO TABS
2.5000 mg | ORAL_TABLET | Freq: Every day | ORAL | Status: DC
  Administered 2012-11-21 – 2012-11-22 (×2): 2.5 mg via ORAL
  Filled 2012-11-21 (×2): qty 1

## 2012-11-21 MED ORDER — LEVOFLOXACIN IN D5W 750 MG/150ML IV SOLN
750.0000 mg | INTRAVENOUS | Status: DC
  Filled 2012-11-21: qty 150

## 2012-11-21 NOTE — Progress Notes (Signed)
Subjective:  Less short of breath but supra-therapeutic PT/INR. Afebrile.  Objective:  Vital Signs in the last 24 hours: Temp:  [97.7 F (36.5 C)-98.3 F (36.8 C)] 98.3 F (36.8 C) (03/29 0620) Pulse Rate:  [59-73] 59 (03/29 0620) Cardiac Rhythm:  [-] Ventricular paced (03/28 2015) Resp:  [18-20] 20 (03/29 0620) BP: (89-109)/(36-54) 108/54 mmHg (03/29 0620) SpO2:  [95 %-99 %] 96 % (03/29 0748) Weight:  [75.7 kg (166 lb 14.2 oz)] 75.7 kg (166 lb 14.2 oz) (03/29 2952)  Physical Exam: BP Readings from Last 1 Encounters:  11/21/12 108/54     Wt Readings from Last 1 Encounters:  11/21/12 75.7 kg (166 lb 14.2 oz)    Weight change: 1.8 kg (3 lb 15.5 oz)  HEENT: [place]/AT, Eyes-Blue, PERL, EOMI, Conjunctiva-Pink, Sclera-Non-icteric Neck: No JVD, No bruit, Trachea midline. Lungs:  Clearing, Bilateral. Cardiac:  Regular rhythm, normal S1 and S2, no S3. II/VI systolic murmur Abdomen:  Soft, non-tender. Extremities:  No edema present. No cyanosis. No clubbing. CNS: AxOx3, Cranial nerves grossly intact, moves all 4 extremities. Right handed. Skin: Warm and dry.   Intake/Output from previous day: 03/28 0701 - 03/29 0700 In: 1630.5 [P.O.:940; I.V.:540.5; IV Piggyback:150] Out: 1200 [Urine:1200]    Lab Results: BMET    Component Value Date/Time   NA 136 11/21/2012 0500   K 4.4 11/21/2012 0500   CL 102 11/21/2012 0500   CO2 24 11/21/2012 0500   GLUCOSE 106* 11/21/2012 0500   BUN 38* 11/21/2012 0500   CREATININE 1.41* 11/21/2012 0500   CALCIUM 9.0 11/21/2012 0500   GFRNONAA 45* 11/21/2012 0500   GFRAA 53* 11/21/2012 0500   CBC    Component Value Date/Time   WBC 18.5* 11/21/2012 0500   RBC 3.66* 11/21/2012 0500   HGB 10.8* 11/21/2012 0500   HCT 31.9* 11/21/2012 0500   PLT 199 11/21/2012 0500   MCV 87.2 11/21/2012 0500   MCH 29.5 11/21/2012 0500   MCHC 33.9 11/21/2012 0500   RDW 14.1 11/21/2012 0500   LYMPHSABS 1.8 05/05/2012 1723   MONOABS 1.5* 05/05/2012 1723   EOSABS 0.1 05/05/2012 1723   BASOSABS 0.0 05/05/2012 1723   CARDIAC ENZYMES Lab Results  Component Value Date   CKTOTAL 87 01/08/2012   CKMB 5.9* 01/08/2012   TROPONINI <0.30 01/08/2012    Scheduled Meds: . albuterol  2.5 mg Nebulization Q6H  . amLODipine  2.5 mg Oral Daily  . carvedilol  3.125 mg Oral BID WC  . docusate sodium  100 mg Oral BID  . furosemide  20 mg Oral Daily  . ipratropium  0.5 mg Nebulization Q6H  . [START ON 11/22/2012] levofloxacin  750 mg Intravenous Q48H  . multivitamin with minerals  1 tablet Oral Daily  . pantoprazole  40 mg Oral Daily  . phytonadione  2.5 mg Oral Once  . simvastatin  10 mg Oral q1800  . sodium chloride  3 mL Intravenous Q12H  . Warfarin - Physician Dosing Inpatient   Does not apply q1800   Continuous Infusions: . sodium chloride 50 mL/hr at 11/20/12 2034   PRN Meds:.acetaminophen, acetaminophen, alum & mag hydroxide-simeth, guaiFENesin-dextromethorphan, nitroGLYCERIN, ondansetron (ZOFRAN) IV, ondansetron  Assessment/Plan: Left lung pneumonia  COPD  HTN  A. Fibrillation  Perm. Pacemaker implant.  CAD  Discontinue Losartan, Add vit. K, decrease IV fluid. Increase activity as tolerated.    LOS: 2 days    Orpah Cobb  MD  11/21/2012, 8:57 AM

## 2012-11-21 NOTE — Progress Notes (Signed)
0900 MD aware of lab works today with orders

## 2012-11-22 LAB — PROTIME-INR: Prothrombin Time: 23.1 seconds — ABNORMAL HIGH (ref 11.6–15.2)

## 2012-11-22 MED ORDER — WARFARIN SODIUM 5 MG PO TABS
5.0000 mg | ORAL_TABLET | Freq: Every day | ORAL | Status: DC
  Filled 2012-11-22: qty 1

## 2012-11-22 MED ORDER — GUAIFENESIN-DM 100-10 MG/5ML PO SYRP: 5.0000 mL | ORAL_SOLUTION | ORAL | Status: DC | PRN

## 2012-11-22 MED ORDER — LEVOFLOXACIN 250 MG PO TABS: 250.0000 mg | ORAL_TABLET | Freq: Every day | ORAL | Status: DC

## 2012-11-22 NOTE — Progress Notes (Signed)
SATURATION QUALIFICATIONS: (This note is used to comply with regulatory documentation for home oxygen)  Patient Saturations on Room Air at Rest = 97%  Patient Saturations on Room Air while Ambulating = 88%  Patient Saturations on 2 Liters of oxygen while Ambulating = 84%  Please briefly explain why patient needs home oxygen: SOB while ambulating and needing to rest on stool with and without oxygen.

## 2012-11-22 NOTE — Progress Notes (Signed)
   CARE MANAGEMENT NOTE 11/22/2012  Patient:  [name], [name]   Account Number:  [id number]  Date Initiated:  11/22/2012  Documentation initiated by:  [facility]  Subjective/Objective Assessment:   CHF     Action/Plan:   Anticipated DC Date:  11/22/2012   Anticipated DC Plan:  HOME W HOME HEALTH SERVICES      DC Planning Services  CM consult      [facility] Choice  HOME HEALTH   Choice offered to / List presented to:  C-1 Patient   DME arranged  [facility]      DME agency  [facility]     [facility] arranged  HH-2 PT  HH-1 RN      [facility] agency  [facility]   Status of service:  Completed, signed off Medicare Important Message given?   (If response is "NO", the following Medicare IM given date fields will be blank) Date Medicare IM given:   Date Additional Medicare IM given:    Discharge Disposition:  HOME W HOME HEALTH SERVICES  Per UR Regulation:    If discussed at Long Length of Stay Meetings, dates discussed:    Comments:  11/22/2012 1300 NCM discussed HH and offered choice. Requesting [facility] for HH. Notified [facility] of HH order for scheduled dc home today. [facility] contact info added to dc instructions. Isidoro Donning RN CCM Case Mgmt phone [phone]

## 2012-11-22 NOTE — Discharge Summary (Signed)
Physician Discharge Summary  Patient ID: [name] MRN: [id number] DOB/AGE: 1931-02-06 79 y.o.  Admit date: 11/19/2012 Discharge date: 11/22/2012  Admission Diagnoses: Left lung pneumonia  COPD  HTN  A. Fibrillation  Perm. Pacemaker implant.  CAD  Discharge Diagnoses:  Principle Problem:  * Left lung pneumonia * COPD  HTN  A. Fibrillation  Perm. Pacemaker implant.  CAD Supra-therapeutic INR, medication induced.  Discharged Condition: good  Hospital Course: 79 years old male with one day history of chills, weakness and shortness of breath without chest pain was treated with IV Levaquin. Chest x-ray was positive for left lung pneumonia with bad COPD. He also had mild fever and cough. He improved in 72 hours and was discharged home with follow up in 1 week.  Consults: None  Significant Diagnostic Studies: labs: Normal electrolytes, Normal Hgb and platelets counts. Mildly elevated WBC count.  Cheat X-Ray: 1. Left lower lobe airspace opacity, suspicious for pneumonia. Follow-up imaging to ensure clearance recommended.                         2. Cardiomegaly with interstitial accentuation. Cannot exclude interstitial edema although much of this appearance is due to scattered scarring and severe emphysema.  Treatments: antibiotics: Levaquin  Discharge Exam: Blood pressure 104/54, pulse 66, temperature 97.7 F (36.5 C), temperature source Oral, resp. rate 16, height 6\' 1"  (1.854 m), weight 74.844 kg (165 lb), SpO2 96.00%. HEENT: [place]/AT, Eyes-Blue, PERL, EOMI, Conjunctiva-Pink, Sclera-Non-icteric  Neck: No JVD, No bruit, Trachea midline.  Lungs: Clearing, Bilateral.  Cardiac: Regular rhythm, normal S1 and S2, no S3. II/VI systolic murmur  Abdomen: Soft, non-tender.  Extremities: No edema present. No cyanosis. No clubbing.  CNS: AxOx3, Cranial nerves grossly intact, moves all 4 extremities. Right handed.  Skin: Warm and dry.   Disposition: 01-Home or Self Care  Discharge  Orders   Future Appointments Provider Department Dept Phone   12/14/2012 9:40 AM [facility] [employer] [facility] [place]) [phone]   12/24/2012 1:45 PM Leslye Peer, MD [facility] [phone]   05/03/2013 2:15 PM Sherrie George, MD [facility] [phone]   Future Orders Complete By Expires     Face-to-face encounter (required for Medicare/Medicaid patients)  As directed     Comments:      I [facility] certify that this patient is under my care and that I, or a nurse practitioner or physician's assistant working with me, had a face-to-face encounter that meets the physician face-to-face encounter requirements with this patient on 11/22/2012. The encounter with the patient was in whole, or in part for the following medical condition(s) which is the primary reason for home health care (List medical condition): Acute pneumonia with COPD, Weakness    Questions:      The encounter with the patient was in whole, or in part, for the following medical condition, which is the primary reason for home health care:  Acute pneumonia with COPD    I certify that, based on my findings, the following services are medically necessary home health services:  Nursing    Physical therapy    My clinical findings support the need for the above services:  Shortness of breath with activity    Further, I certify that my clinical findings support that this patient is homebound due to:  Ambulates short distances less than 300 feet    Reason for Medically Necessary Home Health Services:  Skilled  Nursing- Change/Decline in Patient Status    For home use only DME oxygen  As directed     Questions:      Mode or (Route):  Nasal cannula    Liters per Minute:  2    Frequency:  Continuous    Oxygen conserving device:  Yes    Home Health  As directed     Questions:      To provide the following care/treatments:  PT    RN        Medication List    TAKE  these medications       amLODipine 5 MG tablet  Commonly known as:  NORVASC  Take 1 tablet (5 mg total) by mouth daily.     CALCIUM-VITAMIN D PO  Take 1 tablet by mouth daily.     carvedilol 3.125 MG tablet  Commonly known as:  COREG  Take 3.125 mg by mouth 2 (two) times daily with a meal.     furosemide 20 MG tablet  Commonly known as:  LASIX  Take 20 mg by mouth daily.     guaiFENesin-dextromethorphan 100-10 MG/5ML syrup  Commonly known as:  ROBITUSSIN DM  Take 5 mLs by mouth every 4 (four) hours as needed for cough.     levofloxacin 250 MG tablet  Commonly known as:  LEVAQUIN  Take 1 tablet (250 mg total) by mouth daily.     losartan 50 MG tablet  Commonly known as:  COZAAR  Take 50 mg by mouth every evening.     multivitamin with minerals Tabs  Take 1 tablet by mouth daily.     nitroGLYCERIN 0.4 MG SL tablet  Commonly known as:  NITROSTAT  Place 0.4 mg under the tongue every 5 (five) minutes as needed. For chest pain     pantoprazole 40 MG tablet  Commonly known as:  PROTONIX  Take 40 mg by mouth daily.     PRESCRIPTION MEDICATION  Inhale 2 puffs into the lungs 2 (two) times daily as needed (for shortness of breath or wheezing).     simvastatin 10 MG tablet  Commonly known as:  ZOCOR  Take 10 mg by mouth Daily.     tiotropium 18 MCG inhalation capsule  Commonly known as:  SPIRIVA  Place 1 capsule (18 mcg total) into inhaler and inhale daily.     warfarin 7.5 MG tablet  Commonly known as:  COUMADIN  Take 3.75-7.5 mg by mouth every evening. 1/2 tablet (3.75 mg) on Sunday and Wednesday, 1 tablet (7.5 mg) on all other days         Signed: [facility] 11/22/2012, 11:42 AM

## 2012-11-26 LAB — CULTURE, BLOOD (ROUTINE X 2): Culture: NO GROWTH

## 2012-12-14 ENCOUNTER — Other Ambulatory Visit: Payer: Self-pay | Admitting: Internal Medicine

## 2012-12-14 ENCOUNTER — Encounter: Payer: Self-pay | Admitting: Internal Medicine

## 2012-12-14 ENCOUNTER — Ambulatory Visit (INDEPENDENT_AMBULATORY_CARE_PROVIDER_SITE_OTHER): Payer: Medicare Other | Admitting: *Deleted

## 2012-12-14 DIAGNOSIS — I442 Atrioventricular block, complete: Secondary | ICD-10-CM

## 2012-12-14 DIAGNOSIS — Z950 Presence of cardiac pacemaker: Secondary | ICD-10-CM

## 2012-12-15 LAB — REMOTE PACEMAKER DEVICE
BRDY-0002RV: 60 {beats}/min
DEVICE MODEL PM: 7084939

## 2012-12-24 ENCOUNTER — Encounter: Payer: Self-pay | Admitting: Emergency Medicine

## 2012-12-24 ENCOUNTER — Ambulatory Visit (INDEPENDENT_AMBULATORY_CARE_PROVIDER_SITE_OTHER): Payer: Medicare Other | Admitting: Emergency Medicine

## 2012-12-24 VITALS — BP 134/80 | HR 65 | Temp 97.7°F | Ht 73.0 in | Wt 167.6 lb

## 2012-12-24 DIAGNOSIS — Z720 Tobacco use: Secondary | ICD-10-CM

## 2012-12-24 DIAGNOSIS — J449 Chronic obstructive pulmonary disease, unspecified: Secondary | ICD-10-CM

## 2012-12-24 DIAGNOSIS — F17200 Nicotine dependence, unspecified, uncomplicated: Secondary | ICD-10-CM

## 2012-12-24 NOTE — Assessment & Plan Note (Signed)
Has cut down, no plan to stop

## 2012-12-24 NOTE — Patient Instructions (Addendum)
Please continue your Spiriva daily Continue your albuterol rescue inhaler as needed for shortness of breath Follow with Dr Delton Coombes in 3 months or sooner if you have any problems.

## 2012-12-24 NOTE — Assessment & Plan Note (Signed)
Continue same regimen Follow q57mo

## 2012-12-24 NOTE — Progress Notes (Signed)
Subjective:    Patient ID: [name], male    DOB: 10-16-1930, 79 y.o.   MRN: [id number] HPI 79 yo man with hx COPD, active smoker,  followed by Dr Algie Coffer. Also with CAD, A Fib. Seen by pulmonary for Pneumothorax 01/2011   [facility]11 [facility]   He was admitted and treated for spontaneous R PTX. Discharged with resolution of PTX, but only on prn xopenex as his BD. He has remained SOB, not using the xopenex. Still not near prior baseline (before PTX).   02/21/11 Post [facility]  Readmitted for for recurrent right pnuemothorax. He required a chest tube. CXR prior to discharged with improving PTX.   Today chest xray shows resolved PTX.  Since discharge he feels he is better with less dyspnea and right sided pain.  He has not quit smoking we discussed smoking cesstation.   ROV 03/05/11 - follows up for severe COPD and R PTX. He was readmitted as above for recurrence, now s/p another chest tube. Using albuterol prn about twice a day, xopenex prn. Just started Spiriva on 6/28 - he thinks it is helping. Continues to smoke 4 -5 a day, wants to try to stop. Was d/c to home on O2. CXR today with small residual R PTX, no change from 6/27 but more noticeable than 6/22 discharge. His breathing has not decompensated as in the past when he had recurrent ptx.   ROV 03/29/11 -- severe COPD and R PTX. Follows up after PFT 7/25 - FEV1 40% predicted. Currently on Spiriva + SABA. Continues to have his baseline SOB. He ran out of Spiriva and hasn't been taking.   ROV 09/26/11 -- severe COPD, hx R PTX, on Spiriva. Since last visit his O2 was stopped after he underwent reassuring walking oximetry. Denies CP, does wheeze but no different than usual, some cough. He was treated for PNA this fall, admitted to [facility].   ROV 03/25/12 -- severe COPD, hx R PTX, on Spiriva. Still smoking 1 pk/week. He was hospitalized once since last time for his heart valve. Has caused him to slow down his activity - no longer doing yard work. Takes  Spiriva daily.   ROV 06/23/12 -- severe COPD, hx R PTX, on Spiriva. Still smoking 1 pk/week. He is the same as last visit - no real wheeze or cough, able to exert. No exacerbations. Has had his flu shot.   ROV 12/24/12 -- severe COPD, hx R PTX, on Spiriva. Still smoking 1 pk/week. States that his activity level is stable. He was admitted to [facility] for L CAP, treated w abx and steroids/BD's. His smoking is down to 3-4 cig a day. Uses SABA nightly. He is on spiriva daily.      Objective:   Physical Exam Filed Vitals:   12/24/12 1344  BP: 134/80  Pulse: 65  Temp: 97.7 F (36.5 C)   GEN: A/Ox3; pleasant , NAD, thin  HEENT:  [place]/AT,  EACs-clear, TMs-wnl, NOSE-clear, THROAT-clear, no lesions, no postnasal drip or exudate noted.   NECK:  Supple w/ fair ROM; no JVD; normal carotid impulses w/o bruits; no thyromegaly or nodules palpated; no lymphadenopathy.  RESP  Coarse BS w/ diminshed bs in bases no accessory muscle use, no dullness to percussion, no wheeze  CARD:  RRR, no m/r/g  , no peripheral edema, pulses intact, no cyanosis or clubbing.  Musco: Warm bil, no deformities or joint swelling noted.   Neuro: alert, no focal deficits noted.    Skin: Warm, no lesions  or rashes   Assessment & Plan:  Tobacco abuse Has cut down, no plan to stop  COPD (chronic obstructive pulmonary disease) Continue same regimen Follow q39mo

## 2013-01-08 ENCOUNTER — Encounter: Payer: Self-pay | Admitting: *Deleted

## 2013-02-22 ENCOUNTER — Observation Stay (HOSPITAL_COMMUNITY): Payer: Medicare Other

## 2013-02-22 ENCOUNTER — Observation Stay (HOSPITAL_COMMUNITY)
Admission: EM | Admit: 2013-02-22 | Discharge: 2013-02-23 | Disposition: A | Discharge status: Home / Self Care | Payer: Medicare Other | Source: Home / Self Care | Attending: Emergency Medicine | Admitting: Emergency Medicine

## 2013-02-22 ENCOUNTER — Emergency Department (HOSPITAL_COMMUNITY): Payer: Medicare Other

## 2013-02-22 ENCOUNTER — Encounter (HOSPITAL_COMMUNITY): Payer: Self-pay | Admitting: *Deleted

## 2013-02-22 DIAGNOSIS — F3289 Other specified depressive episodes: Secondary | ICD-10-CM | POA: Diagnosis present

## 2013-02-22 DIAGNOSIS — Z66 Do not resuscitate: Secondary | ICD-10-CM | POA: Diagnosis not present

## 2013-02-22 DIAGNOSIS — S2249XA Multiple fractures of ribs, unspecified side, initial encounter for closed fracture: Secondary | ICD-10-CM | POA: Insufficient documentation

## 2013-02-22 DIAGNOSIS — Z8673 Personal history of transient ischemic attack (TIA), and cerebral infarction without residual deficits: Principal | ICD-10-CM

## 2013-02-22 DIAGNOSIS — W19XXXA Unspecified fall, initial encounter: Secondary | ICD-10-CM | POA: Insufficient documentation

## 2013-02-22 DIAGNOSIS — J9600 Acute respiratory failure, unspecified whether with hypoxia or hypercapnia: Secondary | ICD-10-CM | POA: Diagnosis present

## 2013-02-22 DIAGNOSIS — I2510 Atherosclerotic heart disease of native coronary artery without angina pectoris: Secondary | ICD-10-CM | POA: Insufficient documentation

## 2013-02-22 DIAGNOSIS — Z79899 Other long term (current) drug therapy: Secondary | ICD-10-CM | POA: Insufficient documentation

## 2013-02-22 DIAGNOSIS — J9819 Other pulmonary collapse: Secondary | ICD-10-CM

## 2013-02-22 DIAGNOSIS — Z950 Presence of cardiac pacemaker: Secondary | ICD-10-CM | POA: Insufficient documentation

## 2013-02-22 DIAGNOSIS — F329 Major depressive disorder, single episode, unspecified: Secondary | ICD-10-CM | POA: Diagnosis present

## 2013-02-22 DIAGNOSIS — S2232XA Fracture of one rib, left side, initial encounter for closed fracture: Secondary | ICD-10-CM

## 2013-02-22 DIAGNOSIS — F17200 Nicotine dependence, unspecified, uncomplicated: Secondary | ICD-10-CM | POA: Diagnosis present

## 2013-02-22 DIAGNOSIS — J449 Chronic obstructive pulmonary disease, unspecified: Secondary | ICD-10-CM | POA: Insufficient documentation

## 2013-02-22 DIAGNOSIS — Z9981 Dependence on supplemental oxygen: Secondary | ICD-10-CM

## 2013-02-22 DIAGNOSIS — D509 Iron deficiency anemia, unspecified: Secondary | ICD-10-CM | POA: Diagnosis present

## 2013-02-22 DIAGNOSIS — Z951 Presence of aortocoronary bypass graft: Secondary | ICD-10-CM | POA: Insufficient documentation

## 2013-02-22 DIAGNOSIS — R55 Syncope and collapse: Secondary | ICD-10-CM | POA: Insufficient documentation

## 2013-02-22 DIAGNOSIS — Z7901 Long term (current) use of anticoagulants: Secondary | ICD-10-CM | POA: Insufficient documentation

## 2013-02-22 DIAGNOSIS — I10 Essential (primary) hypertension: Secondary | ICD-10-CM | POA: Insufficient documentation

## 2013-02-22 DIAGNOSIS — I4891 Unspecified atrial fibrillation: Secondary | ICD-10-CM | POA: Insufficient documentation

## 2013-02-22 DIAGNOSIS — J441 Chronic obstructive pulmonary disease with (acute) exacerbation: Secondary | ICD-10-CM | POA: Diagnosis present

## 2013-02-22 DIAGNOSIS — J4489 Other specified chronic obstructive pulmonary disease: Secondary | ICD-10-CM | POA: Insufficient documentation

## 2013-02-22 DIAGNOSIS — E860 Dehydration: Secondary | ICD-10-CM | POA: Insufficient documentation

## 2013-02-22 DIAGNOSIS — C61 Malignant neoplasm of prostate: Secondary | ICD-10-CM | POA: Diagnosis present

## 2013-02-22 DIAGNOSIS — J189 Pneumonia, unspecified organism: Secondary | ICD-10-CM

## 2013-02-22 LAB — CBC
HCT: 39 % (ref 39.0–52.0)
HCT: 37.9 % — ABNORMAL LOW (ref 39.0–52.0)
MCH: 28.7 pg (ref 26.0–34.0)
MCHC: 33.1 g/dL (ref 30.0–36.0)
MCV: 88.6 fL (ref 78.0–100.0)
MCV: 88.6 fL (ref 78.0–100.0)
RDW: 13.7 % (ref 11.5–15.5)
RDW: 13.9 % (ref 11.5–15.5)
WBC: 17.9 10*3/uL — ABNORMAL HIGH (ref 4.0–10.5)
WBC: 13.7 10*3/uL — ABNORMAL HIGH (ref 4.0–10.5)

## 2013-02-22 LAB — URINALYSIS, ROUTINE W REFLEX MICROSCOPIC
Bilirubin Urine: NEGATIVE
Ketones, ur: NEGATIVE mg/dL
Nitrite: NEGATIVE
Protein, ur: 30 mg/dL — AB
Specific Gravity, Urine: 1.011 (ref 1.005–1.030)
Urobilinogen, UA: 0.2 mg/dL (ref 0.0–1.0)

## 2013-02-22 LAB — PROTIME-INR
INR: 2.24 — ABNORMAL HIGH (ref 0.00–1.49)
Prothrombin Time: 24.1 seconds — ABNORMAL HIGH (ref 11.6–15.2)

## 2013-02-22 LAB — URINE MICROSCOPIC-ADD ON

## 2013-02-22 LAB — CREATININE, SERUM: GFR calc Af Amer: 88 mL/min — ABNORMAL LOW (ref >90)

## 2013-02-22 LAB — COMPREHENSIVE METABOLIC PANEL
Albumin: 4 g/dL (ref 3.5–5.2)
BUN: 17 mg/dL (ref 6–23)
Chloride: 101 mEq/L (ref 96–112)
Creatinine, Ser: 1.08 mg/dL (ref 0.50–1.35)
GFR calc Af Amer: 73 mL/min — ABNORMAL LOW (ref >90)
GFR calc non Af Amer: 63 mL/min — ABNORMAL LOW (ref >90)
Glucose, Bld: 113 mg/dL — ABNORMAL HIGH (ref 70–99)
Total Bilirubin: 0.5 mg/dL (ref 0.3–1.2)

## 2013-02-22 MED ORDER — SIMVASTATIN 10 MG PO TABS
10.0000 mg | ORAL_TABLET | Freq: Every day | ORAL | Status: DC
  Administered 2013-02-22 – 2013-02-23 (×2): 10 mg via ORAL
  Filled 2013-02-22 (×2): qty 1

## 2013-02-22 MED ORDER — TIOTROPIUM BROMIDE MONOHYDRATE 18 MCG IN CAPS
18.0000 ug | ORAL_CAPSULE | Freq: Every day | RESPIRATORY_TRACT | Status: DC
  Administered 2013-02-23: 18 ug via RESPIRATORY_TRACT
  Filled 2013-02-22: qty 5

## 2013-02-22 MED ORDER — HEPARIN SODIUM (PORCINE) 5000 UNIT/ML IJ SOLN
5000.0000 [IU] | Freq: Three times a day (TID) | INTRAMUSCULAR | Status: DC
  Administered 2013-02-22 – 2013-02-23 (×3): 5000 [IU] via SUBCUTANEOUS
  Filled 2013-02-22 (×5): qty 1

## 2013-02-22 MED ORDER — SODIUM CHLORIDE 0.9 % IV SOLN
INTRAVENOUS | Status: DC
  Administered 2013-02-22 – 2013-02-23 (×2): via INTRAVENOUS

## 2013-02-22 MED ORDER — CARVEDILOL 3.125 MG PO TABS
3.1250 mg | ORAL_TABLET | Freq: Two times a day (BID) | ORAL | Status: DC
  Administered 2013-02-23: 3.125 mg via ORAL
  Filled 2013-02-22 (×3): qty 1

## 2013-02-22 MED ORDER — ALBUTEROL SULFATE HFA 108 (90 BASE) MCG/ACT IN AERS
2.0000 | INHALATION_SPRAY | Freq: Four times a day (QID) | RESPIRATORY_TRACT | Status: DC
  Administered 2013-02-23 (×4): 2 via RESPIRATORY_TRACT
  Filled 2013-02-22: qty 6.7

## 2013-02-22 MED ORDER — WARFARIN - PHYSICIAN DOSING INPATIENT
Freq: Every day | Status: DC
  Administered 2013-02-23: 18:00:00

## 2013-02-22 MED ORDER — WARFARIN SODIUM 2.5 MG PO TABS: 3.7500 mg | ORAL_TABLET | ORAL | Status: DC

## 2013-02-22 MED ORDER — METHYLPREDNISOLONE SODIUM SUCC 125 MG IJ SOLR
125.0000 mg | Freq: Once | INTRAMUSCULAR | Status: AC
  Administered 2013-02-22: 125 mg via INTRAVENOUS
  Filled 2013-02-22: qty 2

## 2013-02-22 MED ORDER — LOSARTAN POTASSIUM 50 MG PO TABS
50.0000 mg | ORAL_TABLET | Freq: Every evening | ORAL | Status: DC
  Administered 2013-02-22 – 2013-02-23 (×2): 50 mg via ORAL
  Filled 2013-02-22 (×2): qty 1

## 2013-02-22 MED ORDER — ADULT MULTIVITAMIN W/MINERALS CH
1.0000 | ORAL_TABLET | Freq: Every day | ORAL | Status: DC
  Administered 2013-02-22 – 2013-02-23 (×2): 1 via ORAL
  Filled 2013-02-22 (×2): qty 1

## 2013-02-22 MED ORDER — OXYCODONE HCL 5 MG PO TABS
5.0000 mg | ORAL_TABLET | ORAL | Status: DC | PRN
  Administered 2013-02-22 – 2013-02-23 (×2): 5 mg via ORAL
  Filled 2013-02-22 (×3): qty 1

## 2013-02-22 MED ORDER — GUAIFENESIN ER 600 MG PO TB12
600.0000 mg | ORAL_TABLET | Freq: Two times a day (BID) | ORAL | Status: DC
  Administered 2013-02-22 – 2013-02-23 (×2): 600 mg via ORAL
  Filled 2013-02-22 (×3): qty 1

## 2013-02-22 MED ORDER — NITROGLYCERIN 0.4 MG SL SUBL: 0.4000 mg | SUBLINGUAL_TABLET | SUBLINGUAL | Status: DC | PRN

## 2013-02-22 MED ORDER — PANTOPRAZOLE SODIUM 40 MG PO TBEC
40.0000 mg | DELAYED_RELEASE_TABLET | Freq: Every day | ORAL | Status: DC
  Administered 2013-02-22 – 2013-02-23 (×2): 40 mg via ORAL
  Filled 2013-02-22 (×2): qty 1

## 2013-02-22 MED ORDER — ALBUTEROL (5 MG/ML) CONTINUOUS INHALATION SOLN
10.0000 mg/h | INHALATION_SOLUTION | Freq: Once | RESPIRATORY_TRACT | Status: AC
  Administered 2013-02-22: 10 mg/h via RESPIRATORY_TRACT
  Filled 2013-02-22: qty 20

## 2013-02-22 MED ORDER — IPRATROPIUM BROMIDE 0.02 % IN SOLN
0.5000 mg | Freq: Once | RESPIRATORY_TRACT | Status: AC
  Administered 2013-02-22: 0.5 mg via RESPIRATORY_TRACT
  Filled 2013-02-22: qty 2.5

## 2013-02-22 MED ORDER — FUROSEMIDE 20 MG PO TABS
20.0000 mg | ORAL_TABLET | Freq: Every day | ORAL | Status: DC
  Administered 2013-02-22 – 2013-02-23 (×2): 20 mg via ORAL
  Filled 2013-02-22 (×2): qty 1

## 2013-02-22 MED ORDER — SODIUM CHLORIDE 0.9 % IJ SOLN: 3.0000 mL | Freq: Two times a day (BID) | INTRAMUSCULAR | Status: DC

## 2013-02-22 MED ORDER — WARFARIN SODIUM 7.5 MG PO TABS: 3.7500 mg | ORAL_TABLET | Freq: Every evening | ORAL | Status: DC

## 2013-02-22 MED ORDER — WARFARIN SODIUM 7.5 MG PO TABS
7.5000 mg | ORAL_TABLET | ORAL | Status: DC
  Administered 2013-02-22 – 2013-02-23 (×2): 7.5 mg via ORAL
  Filled 2013-02-22 (×2): qty 1

## 2013-02-22 MED ORDER — AMLODIPINE BESYLATE 5 MG PO TABS
5.0000 mg | ORAL_TABLET | Freq: Every day | ORAL | Status: DC
Start: 2013-02-23 — End: 2013-02-23
  Administered 2013-02-23: 5 mg via ORAL
  Filled 2013-02-22: qty 1

## 2013-02-22 MED ORDER — ASPIRIN EC 81 MG PO TBEC
81.0000 mg | DELAYED_RELEASE_TABLET | Freq: Every day | ORAL | Status: DC
  Administered 2013-02-22 – 2013-02-23 (×2): 81 mg via ORAL
  Filled 2013-02-22 (×2): qty 1

## 2013-02-22 NOTE — ED Notes (Signed)
Attempted to call report to floor 

## 2013-02-22 NOTE — ED Notes (Signed)
Pt O2 87% on RA, 23 resp per min, dyspnea noted. Pt put on 2L nasal cannula. O2 94% on 2L.

## 2013-02-22 NOTE — ED Notes (Signed)
Patient transported to X-ray 

## 2013-02-22 NOTE — H&P (Signed)
[name] is an 79 y.o. male.   Chief Complaint: Syncope and rib pain HPI: 79 yr old male collapsed few minutes after hearing news of poor health of a sister. Also complaints of left sided lower ribs pain in anterior axillary line, increased with deep breathing and palpation.  Past Medical History  Diagnosis Date  . COPD (chronic obstructive pulmonary disease)   . Prostate cancer 2003  . Stroke 2000  . CAD (coronary artery disease)   . AF (atrial fibrillation)     permanent  . HTN (hypertension)   . Pneumothorax, right   . Complete heart block     S/P pacemaker insertion// St. Jude Accent RFSR  . Pacemaker   . CHF (congestive heart failure)   . Pneumonia     hx of on right   . Tobacco abuse       Past Surgical History  Procedure Laterality Date  . Coronary artery bypass graft  2003  . Prostatectomy  2003  . Appendectomy    . Eye surgery      bilateral for gaze problem  . Pacemaker insertion  11/06/10    St jude pacemaker implanted by Dr Johney Frame for complete heart block  . Cholecystectomy      Family History  Problem Relation Age of Onset  . Heart attack Father     died age 32  . Heart disease Mother   . Heart disease Sister   . Heart disease Brother   . Multiple sclerosis Mother     died age 67  . Kidney failure Brother    Social History:  reports that he has been smoking Cigarettes.  He has a 15 pack-year smoking history. He has never used smokeless tobacco. He reports that he does not drink alcohol or use illicit drugs.  Allergies: No Known Allergies   (Not in a hospital admission)  Results for orders placed during the hospital encounter of 02/22/13 (from the past 48 hour(s))  URINALYSIS, ROUTINE W REFLEX MICROSCOPIC     Status: Abnormal   Collection Time    02/22/13  3:10 PM      Result Value Range   Color, Urine YELLOW  YELLOW   APPearance CLEAR  CLEAR   Specific Gravity, Urine 1.011  1.005 - 1.030   pH 6.5  5.0 - 8.0   Glucose, UA NEGATIVE   NEGATIVE mg/dL   Hgb urine dipstick LARGE (*) NEGATIVE   Bilirubin Urine NEGATIVE  NEGATIVE   Ketones, ur NEGATIVE  NEGATIVE mg/dL   Protein, ur 30 (*) NEGATIVE mg/dL   Urobilinogen, UA 0.2  0.0 - 1.0 mg/dL   Nitrite NEGATIVE  NEGATIVE   Leukocytes, UA NEGATIVE  NEGATIVE  URINE MICROSCOPIC-ADD ON     Status: Abnormal   Collection Time    02/22/13  3:10 PM      Result Value Range   Squamous Epithelial / LPF RARE  RARE   WBC, UA 0-2  <3 WBC/hpf   RBC / HPF TOO NUMEROUS TO COUNT  <3 RBC/hpf   Bacteria, UA RARE  RARE   Casts HYALINE CASTS (*) NEGATIVE  CBC     Status: Abnormal   Collection Time    02/22/13  3:28 PM      Result Value Range   WBC 17.9 (*) 4.0 - 10.5 K/uL   RBC 4.40  4.22 - 5.81 MIL/uL   Hemoglobin 12.9 (*) 13.0 - 17.0 g/dL   HCT 16.1  09.6 - 04.5 %  MCV 88.6  78.0 - 100.0 fL   MCH 29.3  26.0 - 34.0 pg   MCHC 33.1  30.0 - 36.0 g/dL   RDW 08.6  57.8 - 46.9 %   Platelets 226  150 - 400 K/uL  COMPREHENSIVE METABOLIC PANEL     Status: Abnormal   Collection Time    02/22/13  3:28 PM      Result Value Range   Sodium 140  135 - 145 mEq/L   Potassium 4.7  3.5 - 5.1 mEq/L   Chloride 101  96 - 112 mEq/L   CO2 29  19 - 32 mEq/L   Glucose, Bld 113 (*) 70 - 99 mg/dL   BUN 17  6 - 23 mg/dL   Creatinine, Ser 6.29  0.50 - 1.35 mg/dL   Calcium 9.3  8.4 - 52.8 mg/dL   Total Protein 7.2  6.0 - 8.3 g/dL   Albumin 4.0  3.5 - 5.2 g/dL   AST 29  0 - 37 U/L   ALT 15  0 - 53 U/L   Alkaline Phosphatase 89  39 - 117 U/L   Total Bilirubin 0.5  0.3 - 1.2 mg/dL   GFR calc non Af Amer 63 (*) >90 mL/min   GFR calc Af Amer 73 (*) >90 mL/min   Comment:            The eGFR has been calculated     using the CKD EPI equation.     This calculation has not been     validated in all clinical     situations.     eGFR's persistently     <90 mL/min signify     possible Chronic Kidney Disease.  PROTIME-INR     Status: Abnormal   Collection Time    02/22/13  3:28 PM      Result Value  Range   Prothrombin Time 24.1 (*) 11.6 - 15.2 seconds   INR 2.24 (*) 0.00 - 1.49  POCT I-STAT TROPONIN I     Status: None   Collection Time    02/22/13  3:33 PM      Result Value Range   Troponin i, poc 0.00  0.00 - 0.08 ng/mL   Comment 3            Comment: Due to the release kinetics of cTnI,     a negative result within the first hours     of the onset of symptoms does not rule out     myocardial infarction with certainty.     If myocardial infarction is still suspected,     repeat the test at appropriate intervals.   Ct Head Wo Contrast  02/22/2013   *RADIOLOGY REPORT*  Clinical Data: Loss of consciousness.  Fall  CT HEAD WITHOUT CONTRAST  Technique:  Contiguous axial images were obtained from the base of the skull through the vertex without contrast.  Comparison: CT 05/05/2012  Findings: Moderately severe atrophy.  Moderate chronic microvascular ischemic change throughout the white matter.  Chronic cortical infarct left occipital lobe, unchanged from the prior study.  Negative for acute infarct.  Negative for hemorrhage or mass lesion.  Negative for skull fracture.  IMPRESSION: Atrophy and chronic ischemia.  No acute abnormality.   Original Report Authenticated By: Janeece Riggers, M.D.   Dg Shoulder Left  02/22/2013   *RADIOLOGY REPORT*  Clinical Data: 79 year old male fall left shoulder pain.  LEFT SHOULDER - 2+ VIEW  Comparison: None.  Findings: Left  chest cardiac pacemaker or AICD generator and lead partially visible.  Proximal left humerus appears intact. No glenohumeral joint dislocation.  Minimally-displaced fracture of the distal left clavicle may be mildly comminuted.  This is 17 mm proximal to the left AC joint. Left scapula appears intact.  Left ribs appear within normal limits.  IMPRESSION: 1.  Minimally or nondisplaced fracture distal left clavicle 17 mm proximal to the left AC joint. 2.  No other acute fracture identified about the left shoulder.   Original Report Authenticated By:  Erskine Speed, M.D.    @ROS @ No weight gain or loss. + COPD, + Chest pain, + Shortness of breath, + Pacemaker. + Pneumonia, + Stroke and Ca prostate, + CAD, + arthritis.  No GI bleed, Kidney stone, stroke or psychiatric admission.  Blood pressure 170/79, pulse 54, temperature 97.4 F (36.3 C), temperature source Oral, resp. rate 22, SpO2 92.00%.  HEENT: [place]/AT, blue eyes, Conjunctiva pink. Sclera- non-icteric, wears glasses.  NECK: Supple. No JVD.  LUNGS: Decreased breath sounds on both base along with rhonchi and rales over left lung posteriorly. Tenderness onn palpation of left lower ribs anteriorly. HEART: Irregularly irregular rhythm. Soft systolic murmur.  ABDOMEN: Soft. Bowel sounds present, nontender.  EXTREMITIES: No edema, cyanosis, or clubbing.  CNS:- Grossly intact. Moves all 4 extremities.  SKIN: Warm and dry  Assessment/Plan Syncope and collapse Chest pain r/o rib fracture dehydration COPD  HTN  A. Fibrillation  Perm. Pacemaker implant.  CAD  Place in observation/IV fluids/rib-x-ray/Home medications/Echocardiogram   Mylea Roarty S 02/22/2013, 5:19 PM

## 2013-02-22 NOTE — ED Notes (Signed)
Pt c/o left shoulder pain

## 2013-02-22 NOTE — ED Notes (Signed)
New and old EKG given to Dr. Pickering. Copies placed in pt chart. 

## 2013-02-22 NOTE — ED Notes (Signed)
Pt was outside trimming the hedges and on a 2 feet step stool.  Pt was found on the ground by wife responsive and patient does not remember fall.  Pt has left eyebrow abrasion and right chin abrasion. Pt is on coumadin.  Pt alert and moving all extremities.  Pt has pacemaker.   Pt is complaining posterior left shoulder pain

## 2013-02-22 NOTE — ED Provider Notes (Addendum)
History    CSN: [id number] Arrival date & time 02/22/13  1323  First MD Initiated Contact with Patient 02/22/13 1354     Chief Complaint  Patient presents with  . Loss of Consciousness  . Fall   (Consider location/radiation/quality/duration/timing/severity/associated sxs/prior Treatment) HPI Comments: Pt comes in with cc of fall. Pt has hx of COPD, HRN, CHF, Afib on anticoagulation. Pt reports being outside, doing some yard work, and next thing he recalls is being on the ground. Wife reports hearing a loud noise, and then seeing her husband on the groun. No No nausea, vomiting, visual complains, seizures, altered mental status, loss of consciousness, new weakness, or numbness, no gait instability. Pt has some left sided rib pain, no other complains.  The history is provided by the patient.   Past Medical History  Diagnosis Date  . COPD (chronic obstructive pulmonary disease)   . Prostate cancer 2003  . Stroke 2000  . CAD (coronary artery disease)   . AF (atrial fibrillation)     permanent  . HTN (hypertension)   . Pneumothorax, right   . Complete heart block     S/P pacemaker insertion// St. Jude Accent RFSR  . Pacemaker   . CHF (congestive heart failure)   . Pneumonia     hx of on right   . Tobacco abuse    Past Surgical History  Procedure Laterality Date  . Coronary artery bypass graft  2003  . Prostatectomy  2003  . Appendectomy    . Eye surgery      bilateral for gaze problem  . Pacemaker insertion  11/06/10    St jude pacemaker implanted by Dr Johney Frame for complete heart block  . Cholecystectomy     Family History  Problem Relation Age of Onset  . Heart attack Father     died age 63  . Heart disease Mother   . Heart disease Sister   . Heart disease Brother   . Multiple sclerosis Mother     died age 53  . Kidney failure Brother    History  Substance Use Topics  . Smoking status: Current Every Day Smoker -- 0.25 packs/day for 60 years    Types:  Cigarettes  . Smokeless tobacco: Never Used     Comment: pt down to 3-4 ciagrettes daily  . Alcohol Use: No    Review of Systems  Constitutional: Negative for fever, chills and activity change.  HENT: Negative for neck pain.   Eyes: Negative for visual disturbance.  Respiratory: Positive for cough and shortness of breath. Negative for chest tightness.   Cardiovascular: Positive for chest pain.  Gastrointestinal: Negative for abdominal distention.  Genitourinary: Negative for dysuria, enuresis and difficulty urinating.  Musculoskeletal: Negative for arthralgias.  Neurological: Positive for syncope. Negative for dizziness, light-headedness and headaches.  Psychiatric/Behavioral: Negative for confusion.    Allergies  Review of patient's allergies indicates no known allergies.  Home Medications   Current Outpatient Rx  Name  Route  Sig  Dispense  Refill  . albuterol (PROVENTIL HFA;VENTOLIN HFA) 108 (90 BASE) MCG/ACT inhaler   Inhalation   Inhale 2 puffs into the lungs every 6 (six) hours as needed for wheezing.         Marland Kitchen amLODipine (NORVASC) 5 MG tablet   Oral   Take 1 tablet (5 mg total) by mouth daily.   30 tablet   3   . CALCIUM-VITAMIN D PO   Oral   Take 1 tablet by mouth  daily.         . carvedilol (COREG) 3.125 MG tablet   Oral   Take 3.125 mg by mouth 2 (two) times daily with a meal.         . furosemide (LASIX) 20 MG tablet   Oral   Take 20 mg by mouth daily.         Marland Kitchen losartan (COZAAR) 50 MG tablet   Oral   Take 50 mg by mouth every evening.         . Multiple Vitamin (MULITIVITAMIN WITH MINERALS) TABS   Oral   Take 1 tablet by mouth daily.         . nitroGLYCERIN (NITROSTAT) 0.4 MG SL tablet   Sublingual   Place 0.4 mg under the tongue every 5 (five) minutes as needed. For chest pain         . pantoprazole (PROTONIX) 40 MG tablet   Oral   Take 40 mg by mouth daily.         . simvastatin (ZOCOR) 10 MG tablet   Oral   Take 10 mg by  mouth daily.          Marland Kitchen tiotropium (SPIRIVA) 18 MCG inhalation capsule   Inhalation   Place 1 capsule (18 mcg total) into inhaler and inhale daily.   30 capsule   12   . warfarin (COUMADIN) 7.5 MG tablet   Oral   Take 3.75-7.5 mg by mouth every evening. 1/2 tablet (3.75 mg) on Sunday and Wednesday, 1 tablet (7.5 mg) on all other days          BP 170/79  Pulse 54  Temp(Src) 97.4 F (36.3 C) (Oral)  Resp 22  SpO2 92% Physical Exam  Nursing note and vitals reviewed. Constitutional: He is oriented to person, place, and time. He appears well-developed.  HENT:  Head: Normocephalic and atraumatic.  Eyes: Conjunctivae and EOM are normal. Pupils are equal, round, and reactive to light.  Neck: Normal range of motion. Neck supple.  Cardiovascular: Normal rate.   Murmur heard. Pulmonary/Chest: Effort normal. No respiratory distress. He has wheezes. He has no rales. He exhibits tenderness.  Abdominal: Soft. Bowel sounds are normal. He exhibits no distension. There is no tenderness. There is no rebound and no guarding.  Neurological: He is alert and oriented to person, place, and time.  Skin: Skin is warm.    ED Course  Procedures (including critical care time) Labs Reviewed  CBC - Abnormal; Notable for the following:    WBC 17.9 (*)    Hemoglobin 12.9 (*)    All other components within normal limits  COMPREHENSIVE METABOLIC PANEL - Abnormal; Notable for the following:    Glucose, Bld 113 (*)    GFR calc non Af Amer 63 (*)    GFR calc Af Amer 73 (*)    All other components within normal limits  PROTIME-INR - Abnormal; Notable for the following:    Prothrombin Time 24.1 (*)    INR 2.24 (*)    All other components within normal limits  URINALYSIS, ROUTINE W REFLEX MICROSCOPIC - Abnormal; Notable for the following:    Hgb urine dipstick LARGE (*)    Protein, ur 30 (*)    All other components within normal limits  URINE MICROSCOPIC-ADD ON - Abnormal; Notable for the following:     Casts HYALINE CASTS (*)    All other components within normal limits  POCT I-STAT TROPONIN I   Ct Head Wo  Contrast  02/22/2013   *RADIOLOGY REPORT*  Clinical Data: Loss of consciousness.  Fall  CT HEAD WITHOUT CONTRAST  Technique:  Contiguous axial images were obtained from the base of the skull through the vertex without contrast.  Comparison: CT 05/05/2012  Findings: Moderately severe atrophy.  Moderate chronic microvascular ischemic change throughout the white matter.  Chronic cortical infarct left occipital lobe, unchanged from the prior study.  Negative for acute infarct.  Negative for hemorrhage or mass lesion.  Negative for skull fracture.  IMPRESSION: Atrophy and chronic ischemia.  No acute abnormality.   Original Report Authenticated By: Janeece Riggers, M.D.   Dg Shoulder Left  02/22/2013   *RADIOLOGY REPORT*  Clinical Data: 79 year old male fall left shoulder pain.  LEFT SHOULDER - 2+ VIEW  Comparison: None.  Findings: Left chest cardiac pacemaker or AICD generator and lead partially visible.  Proximal left humerus appears intact. No glenohumeral joint dislocation.  Minimally-displaced fracture of the distal left clavicle may be mildly comminuted.  This is 17 mm proximal to the left AC joint. Left scapula appears intact.  Left ribs appear within normal limits.  IMPRESSION: 1.  Minimally or nondisplaced fracture distal left clavicle 17 mm proximal to the left AC joint. 2.  No other acute fracture identified about the left shoulder.   Original Report Authenticated By: Erskine Speed, M.D.   No diagnosis found.  MDM   Date: 02/22/2013  Rate: 63  Rhythm: atrial fibrillation and premature ventricular contractions (PVC)  QRS Axis: normal  Intervals: normal  ST/T Wave abnormalities: nonspecific ST/T changes  Conduction Disutrbances:none  Narrative Interpretation:   Old EKG Reviewed: changes noted - pvc  DDx includes: Orthostatic hypotension Stroke Vertebral artery  dissection/stenosis Dysrhythmia PE Vasovagal/neurocardiogenic syncope Aortic stenosis Valvular disorder/Cardiomyopathy Anemia COPD exacerbation Rib fractures PNA  Pt comes in with cc of syncope. Was outside, no prodrome. Has PVC on exam, and underlying afib. Due to lack of prodrome, story is concerning. Also has left sided rib pain, and is having diffuse wheezing. Will give breathing tx and reassess. Likely admission.    Derwood Kaplan, MD 02/22/13 1715  5:19 PM Dr. Algie Coffer to admit. Pt has leukocytosis, unsure why. Chest Xray and rib xrays are pending.  Derwood Kaplan, MD 02/22/13 1719

## 2013-02-23 LAB — MAGNESIUM: Magnesium: 1.9 mg/dL (ref 1.5–2.5)

## 2013-02-23 LAB — CBC
HCT: 34.2 % — ABNORMAL LOW (ref 39.0–52.0)
MCV: 88.1 fL (ref 78.0–100.0)
RBC: 3.88 MIL/uL — ABNORMAL LOW (ref 4.22–5.81)
WBC: 11.4 10*3/uL — ABNORMAL HIGH (ref 4.0–10.5)

## 2013-02-23 LAB — BASIC METABOLIC PANEL
CO2: 25 mEq/L (ref 19–32)
Chloride: 102 mEq/L (ref 96–112)
Creatinine, Ser: 1.08 mg/dL (ref 0.50–1.35)
Sodium: 137 mEq/L (ref 135–145)

## 2013-02-23 MED ORDER — OXYCODONE HCL 5 MG PO TABS: 5.0000 mg | ORAL_TABLET | Freq: Three times a day (TID) | ORAL | Status: DC | PRN

## 2013-02-23 NOTE — Progress Notes (Addendum)
Pt had run of 5 beats of Vtach. VS taken, asymptomatic, denies any chest pain. Dr.  Algie Coffer notified and ordered for [facility] Lab. Will continue to monitor pt.

## 2013-02-23 NOTE — Discharge Summary (Signed)
Physician Discharge Summary  Patient ID: [name] MRN: [id number] DOB/AGE: [birth year] 79 y.o.  Admit date: 02/22/2013 Discharge date: 02/23/2013  Admission Diagnoses: Syncope and collapse  Chest pain r/o rib fracture  dehydration  COPD  HTN  A. Fibrillation  Perm. Pacemaker implant.  CAD  Discharge Diagnoses:  Principle Problem: * Syncope and collapse * Probably vaso-vagal Left third, fourth and sixth rib fracture  dehydration  COPD  HTN  A. Fibrillation  Perm. Pacemaker implant.  CAD   Discharged Condition: fair  Hospital Course: 79 yr old male collapsed few minutes after hearing news of poor health of a sister. Also complained of left sided lower ribs pain in anterior axillary line, increased with deep breathing and palpation. He suffered minimally displaced fracture of left 3rd, fourth and sixth ribs. He was given IV fluids, oxygen and oxycodone. He was discharged home in stable condition.  Consults: None   Significant Diagnostic Studies: labs: Elevated WBC count, borderline low Hgb.   CT head:  IMPRESSION: Atrophy and chronic ischemia. No acute abnormality. Original Report Authenticated By: Janeece Riggers, M.D.   X-ray rib-3rd, 4 th and 6 th rib fractures.  Treatments: IV hydration. Tele monitor.  Discharge Exam: Blood pressure 110/49, pulse 66, temperature 98.1 F (36.7 C), temperature source Oral, resp. rate 20, height 6\' 1"  (1.854 m), weight 73.573 kg (162 lb 3.2 oz), SpO2 95.00%. HEENT: [place]/AT, blue eyes, Conjunctiva pink. Sclera- non-icteric, wears glasses.  NECK: Supple. No JVD.  LUNGS: Decreased breath sounds on both base along with rhonchi and rales over left lung posteriorly. Tenderness onn palpation of left lower ribs anteriorly.  HEART: Irregularly irregular rhythm. Soft systolic murmur.  ABDOMEN: Soft. Bowel sounds present, nontender.  EXTREMITIES: No edema, cyanosis, or clubbing.  CNS:- Grossly intact. Moves all 4 extremities.  SKIN: Warm and  dry   Disposition: 06-Home-Health Care Svc   Future Appointments Provider Department Dept Phone   03/03/2013 9:45 AM Hillis Range, MD [facility] Main [facility] [place]) [phone]   03/24/2013 9:00 AM Leslye Peer, MD [facility] [phone]   05/05/2013 2:00 PM Sherrie George, MD [facility] [phone]       Medication List         albuterol 108 (90 BASE) MCG/ACT inhaler  Commonly known as:  PROVENTIL HFA;VENTOLIN HFA  Inhale 2 puffs into the lungs every 6 (six) hours as needed for wheezing.     amLODipine 5 MG tablet  Commonly known as:  NORVASC  Take 1 tablet (5 mg total) by mouth daily.     CALCIUM-VITAMIN D PO  Take 1 tablet by mouth daily.     carvedilol 3.125 MG tablet  Commonly known as:  COREG  Take 3.125 mg by mouth 2 (two) times daily with a meal.     furosemide 20 MG tablet  Commonly known as:  LASIX  Take 20 mg by mouth daily.     losartan 50 MG tablet  Commonly known as:  COZAAR  Take 50 mg by mouth every evening.     multivitamin with minerals Tabs  Take 1 tablet by mouth daily.     nitroGLYCERIN 0.4 MG SL tablet  Commonly known as:  NITROSTAT  Place 0.4 mg under the tongue every 5 (five) minutes as needed. For chest pain     oxyCODONE 5 MG immediate release tablet  Commonly known as:  Oxy IR/ROXICODONE  Take 1 tablet (5 mg total) by mouth every 8 (eight) hours  as needed for pain.     pantoprazole 40 MG tablet  Commonly known as:  PROTONIX  Take 40 mg by mouth daily.     simvastatin 10 MG tablet  Commonly known as:  ZOCOR  Take 10 mg by mouth daily.     tiotropium 18 MCG inhalation capsule  Commonly known as:  SPIRIVA  Place 1 capsule (18 mcg total) into inhaler and inhale daily.     warfarin 7.5 MG tablet  Commonly known as:  COUMADIN  Take 3.75-7.5 mg by mouth every evening. 1/2 tablet (3.75 mg) on Sunday and Wednesday, 1 tablet (7.5 mg) on all other days         Signed: [facility]  S 02/23/2013, 4:35 PM

## 2013-02-23 NOTE — Progress Notes (Signed)
Utilization Review Completed.   Dorcas Melito, RN, BSN Nurse Case Manager  [phone]  

## 2013-02-23 NOTE — Progress Notes (Signed)
[name] to be D/C'd Home per MD order.  Discussed with the patient and all questions fully answered.    Medication List         albuterol 108 (90 BASE) MCG/ACT inhaler  Commonly known as:  PROVENTIL HFA;VENTOLIN HFA  Inhale 2 puffs into the lungs every 6 (six) hours as needed for wheezing.     amLODipine 5 MG tablet  Commonly known as:  NORVASC  Take 1 tablet (5 mg total) by mouth daily.     CALCIUM-VITAMIN D PO  Take 1 tablet by mouth daily.     carvedilol 3.125 MG tablet  Commonly known as:  COREG  Take 3.125 mg by mouth 2 (two) times daily with a meal.     furosemide 20 MG tablet  Commonly known as:  LASIX  Take 20 mg by mouth daily.     losartan 50 MG tablet  Commonly known as:  COZAAR  Take 50 mg by mouth every evening.     multivitamin with minerals Tabs  Take 1 tablet by mouth daily.     nitroGLYCERIN 0.4 MG SL tablet  Commonly known as:  NITROSTAT  Place 0.4 mg under the tongue every 5 (five) minutes as needed. For chest pain     oxyCODONE 5 MG immediate release tablet  Commonly known as:  Oxy IR/ROXICODONE  Take 1 tablet (5 mg total) by mouth every 8 (eight) hours as needed for pain.     pantoprazole 40 MG tablet  Commonly known as:  PROTONIX  Take 40 mg by mouth daily.     simvastatin 10 MG tablet  Commonly known as:  ZOCOR  Take 10 mg by mouth daily.     tiotropium 18 MCG inhalation capsule  Commonly known as:  SPIRIVA  Place 1 capsule (18 mcg total) into inhaler and inhale daily.     warfarin 7.5 MG tablet  Commonly known as:  COUMADIN  Take 3.75-7.5 mg by mouth every evening. 1/2 tablet (3.75 mg) on Sunday and Wednesday, 1 tablet (7.5 mg) on all other days        VVS, Skin clean, dry and intact without evidence of skin break down, no evidence of skin tears noted. IV catheter discontinued intact. Site without signs and symptoms of complications. Dressing and pressure applied.  An After Visit Summary was printed and given to the  patient. Patient escorted via WC, and D/C home via private auto.  Lin Glazier 02/23/2013 5:03 PM

## 2013-02-23 NOTE — Progress Notes (Signed)
  Echocardiogram 2D Echocardiogram has been performed.  Arvil Chaco 02/23/2013, 1:58 PM

## 2013-02-24 ENCOUNTER — Encounter (HOSPITAL_COMMUNITY): Payer: Self-pay | Admitting: *Deleted

## 2013-02-24 ENCOUNTER — Emergency Department (HOSPITAL_COMMUNITY): Payer: Medicare Other

## 2013-02-24 ENCOUNTER — Inpatient Hospital Stay (HOSPITAL_COMMUNITY)
Admission: EM | Admit: 2013-02-24 | Discharge: 2013-03-04 | DRG: 951 | Disposition: A | Discharge status: Skilled Nursing Facility | Payer: Medicare Other | Attending: Cardiovascular Disease | Admitting: Cardiovascular Disease

## 2013-02-24 DIAGNOSIS — I2510 Atherosclerotic heart disease of native coronary artery without angina pectoris: Secondary | ICD-10-CM

## 2013-02-24 DIAGNOSIS — J9690 Respiratory failure, unspecified, unspecified whether with hypoxia or hypercapnia: Secondary | ICD-10-CM

## 2013-02-24 DIAGNOSIS — I442 Atrioventricular block, complete: Secondary | ICD-10-CM

## 2013-02-24 DIAGNOSIS — J449 Chronic obstructive pulmonary disease, unspecified: Secondary | ICD-10-CM

## 2013-02-24 DIAGNOSIS — I4891 Unspecified atrial fibrillation: Secondary | ICD-10-CM

## 2013-02-24 DIAGNOSIS — J9600 Acute respiratory failure, unspecified whether with hypoxia or hypercapnia: Secondary | ICD-10-CM

## 2013-02-24 DIAGNOSIS — J189 Pneumonia, unspecified organism: Secondary | ICD-10-CM

## 2013-02-24 DIAGNOSIS — Z720 Tobacco use: Secondary | ICD-10-CM | POA: Diagnosis present

## 2013-02-24 LAB — COMPREHENSIVE METABOLIC PANEL
Albumin: 3.7 g/dL (ref 3.5–5.2)
Alkaline Phosphatase: 78 U/L (ref 39–117)
BUN: 32 mg/dL — ABNORMAL HIGH (ref 6–23)
Chloride: 100 mEq/L (ref 96–112)
Glucose, Bld: 151 mg/dL — ABNORMAL HIGH (ref 70–99)
Potassium: 4.3 mEq/L (ref 3.5–5.1)
Total Bilirubin: 0.8 mg/dL (ref 0.3–1.2)

## 2013-02-24 LAB — URINALYSIS, ROUTINE W REFLEX MICROSCOPIC
Glucose, UA: NEGATIVE mg/dL
Hgb urine dipstick: NEGATIVE
Ketones, ur: NEGATIVE mg/dL
Leukocytes, UA: NEGATIVE
Protein, ur: NEGATIVE mg/dL
pH: 5 (ref 5.0–8.0)

## 2013-02-24 LAB — POCT I-STAT, CHEM 8
BUN: 35 mg/dL — ABNORMAL HIGH (ref 6–23)
Calcium, Ion: 1.19 mmol/L (ref 1.13–1.30)
Chloride: 103 mEq/L (ref 96–112)
Creatinine, Ser: 1.3 mg/dL (ref 0.50–1.35)
Glucose, Bld: 149 mg/dL — ABNORMAL HIGH (ref 70–99)
Potassium: 4.4 mEq/L (ref 3.5–5.1)

## 2013-02-24 LAB — CBC WITH DIFFERENTIAL/PLATELET
Basophils Absolute: 0 10*3/uL (ref 0.0–0.1)
Basophils Relative: 0 % (ref 0–1)
Eosinophils Relative: 0 % (ref 0–5)
HCT: 37.7 % — ABNORMAL LOW (ref 39.0–52.0)
Hemoglobin: 12.1 g/dL — ABNORMAL LOW (ref 13.0–17.0)
Lymphocytes Relative: 13 % (ref 12–46)
Monocytes Relative: 14 % — ABNORMAL HIGH (ref 3–12)
Neutro Abs: 19 10*3/uL — ABNORMAL HIGH (ref 1.7–7.7)
WBC: 26 10*3/uL — ABNORMAL HIGH (ref 4.0–10.5)

## 2013-02-24 LAB — PROTIME-INR: Prothrombin Time: 30.4 seconds — ABNORMAL HIGH (ref 11.6–15.2)

## 2013-02-24 LAB — POCT I-STAT 3, ART BLOOD GAS (G3+)
Bicarbonate: 25.9 mEq/L — ABNORMAL HIGH (ref 20.0–24.0)
Patient temperature: 98
TCO2: 27 mmol/L (ref 0–100)
pO2, Arterial: 75 mmHg — ABNORMAL LOW (ref 80.0–100.0)

## 2013-02-24 LAB — GLUCOSE, CAPILLARY: Glucose-Capillary: 137 mg/dL — ABNORMAL HIGH (ref 70–99)

## 2013-02-24 MED ORDER — ACETAMINOPHEN 325 MG PO TABS
650.0000 mg | ORAL_TABLET | ORAL | Status: DC | PRN
  Administered 2013-02-24: 650 mg via ORAL
  Filled 2013-02-24: qty 2

## 2013-02-24 MED ORDER — METHYLPREDNISOLONE SODIUM SUCC 125 MG IJ SOLR
125.0000 mg | Freq: Once | INTRAMUSCULAR | Status: AC
  Administered 2013-02-24: 125 mg via INTRAVENOUS
  Filled 2013-02-24: qty 2

## 2013-02-24 MED ORDER — SODIUM CHLORIDE 0.9 % IJ SOLN
3.0000 mL | Freq: Two times a day (BID) | INTRAMUSCULAR | Status: DC
  Administered 2013-02-24 – 2013-03-04 (×9): 3 mL via INTRAVENOUS

## 2013-02-24 MED ORDER — IPRATROPIUM BROMIDE 0.02 % IN SOLN
0.5000 mg | Freq: Once | RESPIRATORY_TRACT | Status: AC
  Administered 2013-02-24: 0.5 mg via RESPIRATORY_TRACT
  Filled 2013-02-24: qty 2.5

## 2013-02-24 MED ORDER — CEFTRIAXONE SODIUM 1 G IJ SOLR
1.0000 g | INTRAMUSCULAR | Status: DC
  Filled 2013-02-24: qty 10

## 2013-02-24 MED ORDER — DEXTROSE 5 % IV SOLN
500.0000 mg | INTRAVENOUS | Status: DC
  Administered 2013-02-24: 500 mg via INTRAVENOUS
  Filled 2013-02-24: qty 500

## 2013-02-24 MED ORDER — DEXTROSE 5 % IV SOLN
1.0000 g | INTRAVENOUS | Status: DC
  Administered 2013-02-24: 1 g via INTRAVENOUS
  Filled 2013-02-24: qty 10

## 2013-02-24 MED ORDER — GUAIFENESIN ER 600 MG PO TB12
600.0000 mg | ORAL_TABLET | Freq: Two times a day (BID) | ORAL | Status: DC
  Administered 2013-02-24 – 2013-03-04 (×15): 600 mg via ORAL
  Filled 2013-02-24 (×17): qty 1

## 2013-02-24 MED ORDER — SODIUM CHLORIDE 0.9 % IV SOLN
INTRAVENOUS | Status: AC
  Administered 2013-02-24: 75 mL/h via INTRAVENOUS
  Administered 2013-02-25: 06:00:00 via INTRAVENOUS

## 2013-02-24 MED ORDER — VANCOMYCIN HCL IN DEXTROSE 750-5 MG/150ML-% IV SOLN
750.0000 mg | Freq: Two times a day (BID) | INTRAVENOUS | Status: DC
  Administered 2013-02-25: 750 mg via INTRAVENOUS
  Filled 2013-02-24 (×2): qty 150

## 2013-02-24 MED ORDER — IPRATROPIUM BROMIDE 0.02 % IN SOLN
0.5000 mg | Freq: Four times a day (QID) | RESPIRATORY_TRACT | Status: DC
  Administered 2013-02-24 – 2013-03-04 (×31): 0.5 mg via RESPIRATORY_TRACT
  Filled 2013-02-24 (×31): qty 2.5

## 2013-02-24 MED ORDER — FENTANYL CITRATE 0.05 MG/ML IJ SOLN
25.0000 ug | INTRAMUSCULAR | Status: DC | PRN
  Administered 2013-02-24: 50 ug via INTRAVENOUS
  Filled 2013-02-24: qty 2

## 2013-02-24 MED ORDER — PIPERACILLIN-TAZOBACTAM 3.375 G IVPB 30 MIN
3.3750 g | Freq: Once | INTRAVENOUS | Status: AC
  Administered 2013-02-24: 3.375 g via INTRAVENOUS
  Filled 2013-02-24: qty 50

## 2013-02-24 MED ORDER — TIOTROPIUM BROMIDE MONOHYDRATE 18 MCG IN CAPS
18.0000 ug | ORAL_CAPSULE | Freq: Every day | RESPIRATORY_TRACT | Status: DC
  Administered 2013-02-25 – 2013-03-04 (×6): 18 ug via RESPIRATORY_TRACT
  Filled 2013-02-24 (×2): qty 5

## 2013-02-24 MED ORDER — ALBUTEROL SULFATE (5 MG/ML) 0.5% IN NEBU
2.5000 mg | INHALATION_SOLUTION | Freq: Four times a day (QID) | RESPIRATORY_TRACT | Status: DC
  Filled 2013-02-24 (×2): qty 0.5

## 2013-02-24 MED ORDER — FUROSEMIDE 10 MG/ML IJ SOLN
40.0000 mg | Freq: Once | INTRAMUSCULAR | Status: AC
  Administered 2013-02-24: 40 mg via INTRAVENOUS
  Filled 2013-02-24: qty 4

## 2013-02-24 MED ORDER — ALBUTEROL SULFATE (5 MG/ML) 0.5% IN NEBU
2.5000 mg | INHALATION_SOLUTION | RESPIRATORY_TRACT | Status: DC | PRN
  Administered 2013-02-27: 2.5 mg via RESPIRATORY_TRACT
  Filled 2013-02-24 (×2): qty 0.5

## 2013-02-24 MED ORDER — LIDOCAINE HCL (PF) 1 % IJ SOLN
INTRAMUSCULAR | Status: AC
  Filled 2013-02-24: qty 5

## 2013-02-24 MED ORDER — VANCOMYCIN HCL 10 G IV SOLR
1250.0000 mg | Freq: Once | INTRAVENOUS | Status: AC
  Administered 2013-02-24: 1250 mg via INTRAVENOUS
  Filled 2013-02-24: qty 1250

## 2013-02-24 MED ORDER — ALBUTEROL SULFATE (5 MG/ML) 0.5% IN NEBU
5.0000 mg | INHALATION_SOLUTION | Freq: Once | RESPIRATORY_TRACT | Status: AC
  Administered 2013-02-24: 5 mg via RESPIRATORY_TRACT
  Filled 2013-02-24: qty 1

## 2013-02-24 MED ORDER — ALBUTEROL SULFATE (5 MG/ML) 0.5% IN NEBU
2.5000 mg | INHALATION_SOLUTION | Freq: Four times a day (QID) | RESPIRATORY_TRACT | Status: DC
  Administered 2013-02-24 – 2013-03-04 (×30): 2.5 mg via RESPIRATORY_TRACT
  Filled 2013-02-24 (×27): qty 0.5

## 2013-02-24 MED ORDER — PIPERACILLIN-TAZOBACTAM 3.375 G IVPB
3.3750 g | Freq: Three times a day (TID) | INTRAVENOUS | Status: DC
  Administered 2013-02-25 – 2013-03-03 (×20): 3.375 g via INTRAVENOUS
  Filled 2013-02-24 (×23): qty 50

## 2013-02-24 MED ORDER — TRAMADOL HCL 50 MG PO TABS
50.0000 mg | ORAL_TABLET | Freq: Four times a day (QID) | ORAL | Status: DC | PRN
  Administered 2013-02-25 – 2013-03-04 (×9): 50 mg via ORAL
  Filled 2013-02-24 (×9): qty 1

## 2013-02-24 NOTE — ED Notes (Signed)
E-link observing the pt.  Dr Algie Coffer here to see.  Pt comfortable wife remains at the bedside

## 2013-02-24 NOTE — ED Notes (Signed)
Family at bedside. 

## 2013-02-24 NOTE — H&P (Signed)
[name] is an 79 y.o. male.   Chief Complaint: Weakness and fever HPI: 79 year old male with some weakness and fever of 101 F at home. No chest pain. Has chronic obstructive lung disease with home oxygen use. No significant cough but x-ray chest suggestive of possible pneumonia and elevated WBCs.  Past Medical History  Diagnosis Date  . COPD (chronic obstructive pulmonary disease)   . Prostate cancer 2003  . Stroke 2000  . CAD (coronary artery disease)   . AF (atrial fibrillation)     permanent  . HTN (hypertension)   . Pneumothorax, right   . Complete heart block     S/P pacemaker insertion// [employer]. [employer] RFSR  . Pacemaker   . CHF (congestive heart failure)   . Pneumonia     hx of on right   . Tobacco abuse       Past Surgical History  Procedure Laterality Date  . Coronary artery bypass graft  2003  . Prostatectomy  2003  . Appendectomy    . Eye surgery      bilateral for gaze problem  . Pacemaker insertion  11/06/10    St jude pacemaker implanted by Dr Johney Frame for complete heart block  . Cholecystectomy      Family History  Problem Relation Age of Onset  . Heart attack Father     died age 92  . Heart disease Mother   . Heart disease Sister   . Heart disease Brother   . Multiple sclerosis Mother     died age 22  . Kidney failure Brother    Social History:  reports that he has been smoking Cigarettes.  He has a 15 pack-year smoking history. He has never used smokeless tobacco. He reports that he does not drink alcohol or use illicit drugs.  Allergies: No Known Allergies   (Not in a hospital admission)  Results for orders placed during the hospital encounter of 02/24/13 (from the past 48 hour(s))  POCT I-STAT TROPONIN I     Status: None   Collection Time    02/24/13  3:55 PM      Result Value Range   Troponin i, poc 0.05  0.00 - 0.08 ng/mL   Comment 3            Comment: Due to the release kinetics of cTnI,     a negative result within the first  hours     of the onset of symptoms does not rule out     myocardial infarction with certainty.     If myocardial infarction is still suspected,     repeat the test at appropriate intervals.  CBC WITH DIFFERENTIAL     Status: Abnormal   Collection Time    02/24/13  3:56 PM      Result Value Range   WBC 26.0 (*) 4.0 - 10.5 K/uL   RBC 4.18 (*) 4.22 - 5.81 MIL/uL   Hemoglobin 12.1 (*) 13.0 - 17.0 g/dL   HCT 16.1 (*) 09.6 - 04.5 %   MCV 90.2  78.0 - 100.0 fL   MCH 28.9  26.0 - 34.0 pg   MCHC 32.1  30.0 - 36.0 g/dL   RDW 40.9  81.1 - 91.4 %   Platelets 220  150 - 400 K/uL   Neutrophils Relative % 73  43 - 77 %   Lymphocytes Relative 13  12 - 46 %   Monocytes Relative 14 (*) 3 - 12 %  Eosinophils Relative 0  0 - 5 %   Basophils Relative 0  0 - 1 %   Neutro Abs 19.0 (*) 1.7 - 7.7 K/uL   Lymphs Abs 3.4  0.7 - 4.0 K/uL   Monocytes Absolute 3.6 (*) 0.1 - 1.0 K/uL   Eosinophils Absolute 0.0  0.0 - 0.7 K/uL   Basophils Absolute 0.0  0.0 - 0.1 K/uL   Smear Review MORPHOLOGY UNREMARKABLE    COMPREHENSIVE METABOLIC PANEL     Status: Abnormal   Collection Time    02/24/13  3:56 PM      Result Value Range   Sodium 136  135 - 145 mEq/L   Potassium 4.3  3.5 - 5.1 mEq/L   Chloride 100  96 - 112 mEq/L   CO2 28  19 - 32 mEq/L   Glucose, Bld 151 (*) 70 - 99 mg/dL   BUN 32 (*) 6 - 23 mg/dL   Creatinine, Ser 1.61  0.50 - 1.35 mg/dL   Calcium 8.8  8.4 - 09.6 mg/dL   Total Protein 7.2  6.0 - 8.3 g/dL   Albumin 3.7  3.5 - 5.2 g/dL   AST 32  0 - 37 U/L   ALT 18  0 - 53 U/L   Alkaline Phosphatase 78  39 - 117 U/L   Total Bilirubin 0.8  0.3 - 1.2 mg/dL   GFR calc non Af Amer 59 (*) >90 mL/min   GFR calc Af Amer 68 (*) >90 mL/min   Comment:            The eGFR has been calculated     using the CKD EPI equation.     This calculation has not been     validated in all clinical     situations.     eGFR's persistently     <90 mL/min signify     possible Chronic Kidney Disease.  PROTIME-INR      Status: Abnormal   Collection Time    02/24/13  3:56 PM      Result Value Range   Prothrombin Time 30.4 (*) 11.6 - 15.2 seconds   INR 3.04 (*) 0.00 - 1.49  POCT I-STAT, CHEM 8     Status: Abnormal   Collection Time    02/24/13  3:56 PM      Result Value Range   Sodium 139  135 - 145 mEq/L   Potassium 4.4  3.5 - 5.1 mEq/L   Chloride 103  96 - 112 mEq/L   BUN 35 (*) 6 - 23 mg/dL   Creatinine, Ser 0.45  0.50 - 1.35 mg/dL   Glucose, Bld 409 (*) 70 - 99 mg/dL   Calcium, Ion 8.11  9.14 - 1.30 mmol/L   TCO2 26  0 - 100 mmol/L   Hemoglobin 13.6  13.0 - 17.0 g/dL   HCT 78.2  95.6 - 21.3 %  TROPONIN I     Status: None   Collection Time    02/24/13  3:57 PM      Result Value Range   Troponin I <0.30  <0.30 ng/mL   Comment:            Due to the release kinetics of cTnI,     a negative result within the first hours     of the onset of symptoms does not rule out     myocardial infarction with certainty.     If myocardial infarction is still suspected,  repeat the test at appropriate intervals.  PRO B NATRIURETIC PEPTIDE     Status: Abnormal   Collection Time    02/24/13  3:57 PM      Result Value Range   Pro B Natriuretic peptide (BNP) 2625.0 (*) 0 - 450 pg/mL  CG4 I-STAT (LACTIC ACID)     Status: None   Collection Time    02/24/13  4:06 PM      Result Value Range   Lactic Acid, Venous 1.94  0.5 - 2.2 mmol/L  POCT I-STAT 3, BLOOD GAS (G3+)     Status: Abnormal   Collection Time    02/24/13  4:51 PM      Result Value Range   pH, Arterial 7.353  7.350 - 7.450   pCO2 arterial 46.4 (*) 35.0 - 45.0 mmHg   pO2, Arterial 75.0 (*) 80.0 - 100.0 mmHg   Bicarbonate 25.9 (*) 20.0 - 24.0 mEq/L   TCO2 27  0 - 100 mmol/L   O2 Saturation 94.0     Patient temperature 98.0 F     Collection site RADIAL, ALLEN'S TEST ACCEPTABLE     Drawn by Operator     Sample type ARTERIAL     Dg Chest Portable 1 View  02/24/2013   *RADIOLOGY REPORT*  Clinical Data: Shortness of breath, COPD, smoking  history  PORTABLE CHEST - 1 VIEW  Comparison: Chest x-ray of 02/22/2013  Findings: The lungs remain hyperaerated consistent with emphysema. Minimally prominent markings are present at the left lung base and pneumonia cannot be excluded.  Follow-up is recommended if clinically concerned.  Cardiomegaly is stable and a permanent pacemaker remains.  IMPRESSION:  1.  Emphysema. 2.  Minimally prominent markings at the left lung base.  Consider follow-up if warranted clinically. 2.  Stable cardiomegaly with pacer.   Original Report Authenticated By: Dwyane Dee, M.D.    @ROS @ No weight gain or loss. + COPD, + Chest pain, + Shortness of breath, + Pacemaker. + Pneumonia, + Stroke and Ca prostate, + CAD, + arthritis.  No GI bleed, Kidney stone, stroke or psychiatric admission.  Blood pressure 135/57, pulse 69, temperature 98 F (36.7 C), temperature source Axillary, resp. rate 19, SpO2 97.00%. HEENT: [place]/AT, blue eyes, Conjunctiva pink. Sclera- non-icteric, wears glasses.  NECK: Supple. No JVD.  LUNGS: Decreased breath sounds on both base along with rhonchi and rales over left lung posteriorly. Tenderness on palpation of left lower ribs anteriorly.  HEART: Irregularly irregular rhythm. Soft systolic murmur.  ABDOMEN: Soft. Bowel sounds present, nontender.  EXTREMITIES: No edema, cyanosis, or clubbing.  CNS:- Grossly intact. Moves all 4 extremities.  SKIN: Warm and dry   Assessment/Plan Acute exacerbation of COPD  HTN  A. Fibrillation  Perm. Pacemaker implant.  CAD  Admit to ICU IV antibiotic BIPAP/Home medications and antibiotic  Clemon Devaul S 02/24/2013, 6:11 PM

## 2013-02-24 NOTE — ED Notes (Signed)
The pt is resting at present.  Wife remains at the  bedside

## 2013-02-24 NOTE — Progress Notes (Signed)
ANTIBIOTIC CONSULT NOTE - INITIAL  Pharmacy Consult for vancomycin/zosyn Indication: suspected PNA  No Known Allergies  Patient Measurements: Wt= 73.6kg  Vital Signs: Temp: 98 F (36.7 C) (07/02 1540) Temp src: Axillary (07/02 1540) BP: 135/59 mmHg (07/02 1812) Pulse Rate: 64 (07/02 1812) Intake/Output from previous day:   Intake/Output from this shift:    Labs:  Recent Labs  02/22/13 1848 02/23/13 0516 02/24/13 1556  WBC 13.7* 11.4* 26.0*  HGB 12.3* 11.2* 12.1*  13.6  PLT 227 205 220  CREATININE 0.97 1.08 1.14  1.30   The CrCl is unknown because both a height and weight (above a minimum accepted value) are required for this calculation. No results found for this basename: VANCOTROUGH, VANCOPEAK, VANCORANDOM, GENTTROUGH, GENTPEAK, GENTRANDOM, TOBRATROUGH, TOBRAPEAK, TOBRARND, AMIKACINPEAK, AMIKACINTROU, AMIKACIN,  in the last 72 hours   Microbiology: No results found for this or any previous visit (from the past 720 hour(s)).  Medical History: Past Medical History  Diagnosis Date  . COPD (chronic obstructive pulmonary disease)   . Prostate cancer 2003  . Stroke 2000  . CAD (coronary artery disease)   . AF (atrial fibrillation)     permanent  . HTN (hypertension)   . Pneumothorax, right   . Complete heart block     S/P pacemaker insertion// [employer]. [employer] RFSR  . Pacemaker   . CHF (congestive heart failure)   . Pneumonia     hx of on right   . Tobacco abuse     Assessment: 79 yo male here with COPD exacerbation and concern for HCAP (recent hospital admission).  Patient to begin vancomycin and zosyn. WBC= 26, SCr= 1.14, CrCl ~ 60.  Goal of Therapy:  Vancomycin trough level 15-20 mcg/ml  Plan:  -Zosyn 3.375gm IV q8h -Vancomycin 1250mg  IV x1 followed by 750mg  IV q12h -Will follow cultures, renal function and vancomycin levels as needed  Harland German, Pharm D 02/24/2013 8:37 PM

## 2013-02-24 NOTE — ED Notes (Signed)
Cc doctor here to see

## 2013-02-24 NOTE — Consult Note (Signed)
PULMONARY  / CRITICAL CARE MEDICINE  Name: [name] MRN: [id number] DOB: 1931-01-12    ADMISSION DATE:  02/24/2013 CONSULTATION DATE:  02/24/2013  REFERRING MD :  Algie Coffer PRIMARY SERVICE: Cardiology  CHIEF COMPLAINT:  Shortness of breath, chest pain  BRIEF PATIENT DESCRIPTION: 79 y/o male with COPD was discharged from [facility] on 7/1 after a brief hospitalization for chest pain from a rib fracture returned to the hospital on 7/2 with recurrent dyspnea and chest pain.  PCCM consulted for BIPAP management.  SIGNIFICANT EVENTS / STUDIES:    LINES / TUBES: 7/2 BIPAP >>   CULTURES: none  ANTIBIOTICS: 7/2 ceftriaxone >> 7/2 azithro >>  HISTORY OF PRESENT ILLNESS:  79 y/o male with COPD was discharged from [facility] on 7/1 after a brief hospitalization for chest pain from a rib fracture returned to the hospital on 7/2 with recurrent dyspnea and chest pain.  PCCM consulted for BIPAP management. His wife stated that all day on 7/2 he was somnolent and had some mild respiratory distress.  Unfortunately the respiratory distress progressed and upon his arrival to the [facility] ED he was in marked respiratory distress.  This improved significantly with BIPAP.  His wife noted that he felt hot and he had temperature of 100.  He had minimal cough.  Upon my arrival to the ED he was breathing comfortably on BIPAP and requested that the mask be removed.    PAST MEDICAL HISTORY :  Past Medical History  Diagnosis Date  . COPD (chronic obstructive pulmonary disease)   . Prostate cancer 2003  . Stroke 2000  . CAD (coronary artery disease)   . AF (atrial fibrillation)     permanent  . HTN (hypertension)   . Pneumothorax, right   . Complete heart block     S/P pacemaker insertion// [employer]. Jude Accent RFSR  . Pacemaker   . CHF (congestive heart failure)   . Pneumonia     hx of on right   . Tobacco abuse    Past Surgical History  Procedure Laterality Date  . Coronary artery bypass graft  2003  . Prostatectomy   2003  . Appendectomy    . Eye surgery      bilateral for gaze problem  . Pacemaker insertion  11/06/10    St jude pacemaker implanted by Dr Johney Frame for complete heart block  . Cholecystectomy     Prior to Admission medications   Medication Sig Start Date End Date Taking? Authorizing Provider  albuterol (PROVENTIL HFA;VENTOLIN HFA) 108 (90 BASE) MCG/ACT inhaler Inhale 2 puffs into the lungs every 6 (six) hours as needed for wheezing.   Yes Historical Provider, MD  amLODipine (NORVASC) 5 MG tablet Take 1 tablet (5 mg total) by mouth daily. 05/08/12 05/08/13 Yes Ricki Rodriguez, MD  CALCIUM-VITAMIN D PO Take 1 tablet by mouth daily.   Yes Historical Provider, MD  carvedilol (COREG) 3.125 MG tablet Take 3.125 mg by mouth 2 (two) times daily with a meal.   Yes Historical Provider, MD  furosemide (LASIX) 20 MG tablet Take 20 mg by mouth daily.   Yes Historical Provider, MD  losartan (COZAAR) 50 MG tablet Take 50 mg by mouth every evening.   Yes Historical Provider, MD  Multiple Vitamin (MULITIVITAMIN WITH MINERALS) TABS Take 1 tablet by mouth daily.   Yes Historical Provider, MD  nitroGLYCERIN (NITROSTAT) 0.4 MG SL tablet Place 0.4 mg under the tongue every 5 (five) minutes as needed. For chest pain   Yes  Historical Provider, MD  oxyCODONE (OXY IR/ROXICODONE) 5 MG immediate release tablet Take 1 tablet (5 mg total) by mouth every 8 (eight) hours as needed for pain. 02/23/13  Yes Ricki Rodriguez, MD  pantoprazole (PROTONIX) 40 MG tablet Take 40 mg by mouth daily.   Yes Historical Provider, MD  simvastatin (ZOCOR) 10 MG tablet Take 10 mg by mouth daily.  06/11/12  Yes Historical Provider, MD  tiotropium (SPIRIVA) 18 MCG inhalation capsule Place 1 capsule (18 mcg total) into inhaler and inhale daily. 03/25/12  Yes Leslye Peer, MD  warfarin (COUMADIN) 7.5 MG tablet Take 3.75-7.5 mg by mouth every evening. 1/2 tablet (3.75 mg) on Sunday and Wednesday, 1 tablet (7.5 mg) on all other days   Yes Historical  Provider, MD   No Known Allergies  FAMILY HISTORY:  Family History  Problem Relation Age of Onset  . Heart attack Father     died age 65  . Heart disease Mother   . Heart disease Sister   . Heart disease Brother   . Multiple sclerosis Mother     died age 49  . Kidney failure Brother    SOCIAL HISTORY:  reports that he has been smoking Cigarettes.  He has a 15 pack-year smoking history. He has never used smokeless tobacco. He reports that he does not drink alcohol or use illicit drugs.  REVIEW OF SYSTEMS:   Gen: Noted fever today, denies chills, weight change, fatigue, night sweats HEENT: Denies blurred vision, double vision, hearing loss, tinnitus, sinus congestion, rhinorrhea, sore throat, neck stiffness, dysphagia PULM: per HPI CV: Denies chest pain, edema, orthopnea, paroxysmal nocturnal dyspnea, palpitations GI: Denies abdominal pain, nausea, vomiting, diarrhea, hematochezia, melena, constipation, change in bowel habits GU: Denies dysuria, hematuria, polyuria, oliguria, urethral discharge Endocrine: Denies hot or cold intolerance, polyuria, polyphagia or appetite change Derm: Denies rash, dry skin, scaling or peeling skin change Heme: Denies easy bruising, bleeding, bleeding gums Neuro: Denies headache, numbness, weakness, slurred speech, loss of memory or consciousness   SUBJECTIVE:   VITAL SIGNS: Temp:  [98 F (36.7 C)] 98 F (36.7 C) (07/02 1540) Pulse Rate:  [64-69] 64 (07/02 1812) Resp:  [19-30] 22 (07/02 1812) BP: (135-168)/(41-60) 135/59 mmHg (07/02 1812) SpO2:  [95 %-99 %] 99 % (07/02 1812) FiO2 (%):  [50 %] 50 % (07/02 1545) HEMODYNAMICS:   VENTILATOR SETTINGS: Vent Mode:  [-] BIPAP FiO2 (%):  [50 %] 50 % Set Rate:  [15 bmp] 15 bmp PEEP:  [5 cmH20] 5 cmH20 INTAKE / OUTPUT: Intake/Output     07 /02 0701 - 07/03 0700   Urine 200   Total Output 200   Net -200         PHYSICAL EXAMINATION:  Gen: elderly male, comfortable on BIPAP HEENT: NCAT,  PERRL, EOMi, BIPAP mask in place PULM: rhonchi bilaterally, diminished sounds L base CV: RRR, loud systolic murmur LLSB, no JVD AB: BS+, soft, nontender, no hsm Ext: warm, no edema, no clubbing, no cyanosis Derm: no rash or skin breakdown Neuro: Awake and alert, oriented to place, situation, moves all four ext well, follows commands, conversatn   LABS:  Recent Labs Lab 02/22/13 1528 02/22/13 1848 02/23/13 0516 02/24/13 1556 02/24/13 1557 02/24/13 1606 02/24/13 1651  HGB 12.9* 12.3* 11.2* 12.1*  13.6  --   --   --   WBC 17.9* 13.7* 11.4* 26.0*  --   --   --   PLT 226 227 205 220  --   --   --  NA 140  --  137 136  139  --   --   --   K 4.7  --  4.2 4.3  4.4  --   --   --   CL 101  --  102 100  103  --   --   --   CO2 29  --  25 28  --   --   --   GLUCOSE 113*  --  155* 151*  149*  --   --   --   BUN 17  --  25* 32*  35*  --   --   --   CREATININE 1.08 0.97 1.08 1.14  1.30  --   --   --   CALCIUM 9.3  --  8.7 8.8  --   --   --   MG  --   --  1.9  --   --   --   --   AST 29  --   --  32  --   --   --   ALT 15  --   --  18  --   --   --   ALKPHOS 89  --   --  78  --   --   --   BILITOT 0.5  --   --  0.8  --   --   --   PROT 7.2  --   --  7.2  --   --   --   ALBUMIN 4.0  --   --  3.7  --   --   --   INR 2.24*  --  2.16* 3.04*  --   --   --   LATICACIDVEN  --   --   --   --   --  1.94  --   TROPONINI  --   --   --   --  <0.30  --   --   PROBNP  --   --   --   --  2625.0*  --   --   PHART  --   --   --   --   --   --  7.353  PCO2ART  --   --   --   --   --   --  46.4*  PO2ART  --   --   --   --   --   --  75.0*   No results found for this basename: GLUCAP,  in the last 168 hours  CXR: Emphysema and ? insterstitial scarring vs bullae bilaterally; possible left lower lobe infiltrate  ASSESSMENT / PLAN:  PULMONARY A: Respiratory failure due to splinting in setting of COPD; markedly improved with BIPAP Possible HCAP P:   -fentanyl prn for splinting -BIPAP  prn -see ID -tiotropium -scheduled and prn albuterol -O2 as needed (2L [place] at night)  CARDIOVASCULAR A: Afib Complete heart block s/p pacer P:  -per cardiology  RENAL A:  Pre-renal appearing labs (BUN: Cr > 20) P:   -gentle IVF  GASTROINTESTINAL A:  No acute issues P:   -npo overnight -advance diet if respiratory status remains stable  HEMATOLOGIC A:  Warfarin use for afib P:  -monitor for bleeding -per cardiology  INFECTIOUS A:  HCAP? Questionable LLL infiltrate in L base with low grade temp and elevated WBC on 7/2 P:   -cover empirically with vanc/zosyn -repeat CXR, if resolved infiltrate then d/c abx  ENDOCRINE A:  No acute issues P:   -monitor glucose  NEUROLOGIC A:  No acute issues P:   -frequent orientation, minimize sedating meds  Code status: he and his wife confirm that he is full no code blue (DNAR/DNI)  TODAY'S SUMMARY: Respiratory failure related to splinting, possible HCAP; improved with BIPAP in ED; plan supportive care overnight; d/c abx quickly if no infiltrate on 7/3 CXR  I have personally obtained a history, examined the patient, evaluated laboratory and imaging results, formulated the assessment and plan and placed orders. CRITICAL CARE: The patient is critically ill with multiple organ systems failure and requires high complexity decision making for assessment and support, frequent evaluation and titration of therapies, application of advanced monitoring technologies and extensive interpretation of multiple databases. Critical Care Time devoted to patient care services described in this note is 45 minutes.   Fonnie Jarvis Pulmonary and Critical Care Medicine [facility] Pager: [phone]  02/24/2013, 8:18 PM

## 2013-02-24 NOTE — ED Notes (Signed)
Report called to 2100 rn.  Another doctor is at bedside at present

## 2013-02-24 NOTE — ED Notes (Signed)
The pt is taking  A nap.  His wife remains at the bedside.  The pts wife reports that they have been under a lot of stress the past few days family illness

## 2013-02-24 NOTE — Progress Notes (Signed)
eLink Physician-Brief Progress Note Patient Name: [name] DOB: [birth year] MRN: [id number]  Date of Service  02/24/2013   HPI/Events of Note     eICU Interventions  Tylenol + Tramadol for pain SCDs   Intervention Category Intermediate Interventions: Pain - evaluation and management;Best-practice therapies (e.g. DVT, beta blocker, etc.)  Victorio Creeden V. 02/24/2013, 9:21 PM

## 2013-02-24 NOTE — ED Provider Notes (Signed)
History    CSN: [id number] Arrival date & time 02/24/13  1527  First MD Initiated Contact with Patient 02/24/13 1536     Chief Complaint  Patient presents with  . Shortness of Breath   (Consider location/radiation/quality/duration/timing/severity/associated sxs/prior Treatment) HPI [name] 79 y.o.  has a past medical history of COPD (chronic obstructive pulmonary disease); Prostate cancer (2003); Stroke (2000); CAD (coronary artery disease); AF (atrial fibrillation); HTN (hypertension); Pneumothorax, right; Complete heart block; Pacemaker; CHF (congestive heart failure); Pneumonia; and Tobacco abuse. who presents by EMS for worsening shortness of breath. According to the patient's wife, patient appeared to be diaphoretic and more tired than usual today. In addition she noted him to be increasingly more short of breath early this afternoon. Patient described this as "cannot catch my breath". This was worsening throughout the day. He is normally on 2 L nasal cannula at home intermittently, and this did not improve his symptoms. Shortness of breath is worsened with any exertion. He denies fever, chills, sweats, nausea, vomiting, abdominal pain, diarrhea, or lower extremity edema. Of note patient was recently hospitalized 3 days ago and discharged the same day after a fall resulting in 3 left lateral rib fractures. Patient is complaining of left-sided chest wall pain associated with these rib fractures. He reports the pain has been improving. He has been taking narcotics for pain. Pain is worsened with deep inspiration.  Past Medical History  Diagnosis Date  . COPD (chronic obstructive pulmonary disease)   . Prostate cancer 2003  . Stroke 2000  . CAD (coronary artery disease)   . AF (atrial fibrillation)     permanent  . HTN (hypertension)   . Pneumothorax, right   . Complete heart block     S/P pacemaker insertion// [employer]ent RFSR  . Pacemaker   . CHF (congestive heart failure)    . Pneumonia     hx of on right   . Tobacco abuse    Past Surgical History  Procedure Laterality Date  . Coronary artery bypass graft  2003  . Prostatectomy  2003  . Appendectomy    . Eye surgery      bilateral for gaze problem  . Pacemaker insertion  11/06/10    St jude pacemaker implanted by Dr Johney Frame for complete heart block  . Cholecystectomy     Family History  Problem Relation Age of Onset  . Heart attack Father     died age 36  . Heart disease Mother   . Heart disease Sister   . Heart disease Brother   . Multiple sclerosis Mother     died age 24  . Kidney failure Brother    History  Substance Use Topics  . Smoking status: Current Every Day Smoker -- 0.25 packs/day for 60 years    Types: Cigarettes  . Smokeless tobacco: Never Used     Comment: pt down to 3-4 ciagrettes daily  . Alcohol Use: No    Review of Systems  Constitutional: Positive for fever (subjective), appetite change and fatigue. Negative for chills, diaphoresis and activity change.  HENT: Negative for congestion, sore throat, rhinorrhea, sneezing and trouble swallowing.   Eyes: Negative for pain and redness.  Respiratory: Positive for chest tightness and shortness of breath. Negative for cough, choking, wheezing and stridor.   Cardiovascular: Positive for chest pain. Negative for leg swelling.  Gastrointestinal: Positive for nausea. Negative for vomiting, diarrhea, constipation, blood in stool, abdominal distention and anal bleeding.  Musculoskeletal: Negative  for myalgias and back pain.  Skin: Negative for rash.  Neurological: Negative for dizziness, speech difficulty, weakness, light-headedness, numbness and headaches.  Hematological: Negative for adenopathy.  Psychiatric/Behavioral: Negative for confusion.    Allergies  Review of patient's allergies indicates no known allergies.  Home Medications   No current outpatient prescriptions on file. BP 127/57  Pulse 69  Temp(Src) 98.2 F (36.8  C) (Oral)  Resp 21  Wt 164 lb 3.9 oz (74.5 kg)  BMI 21.67 kg/m2  SpO2 92% Physical Exam  Nursing note and vitals reviewed. Constitutional: He is oriented to person, place, and time. He appears well-developed and well-nourished. He appears distressed (respiratory).  HENT:  Head: Normocephalic and atraumatic.  Eyes: Conjunctivae and EOM are normal. Right eye exhibits no discharge. Left eye exhibits no discharge.  Neck: Normal range of motion. Neck supple. No tracheal deviation present.  Cardiovascular: Normal rate, regular rhythm and normal heart sounds.  Exam reveals no friction rub.   No murmur heard. Pulmonary/Chest: No stridor. He is in respiratory distress (accesory muscle usage, tachypnea). He has wheezes (coarse bilaterally). He has no rales. He exhibits tenderness (left lateral side).  Abdominal: Soft. He exhibits no distension. There is no tenderness. There is no rebound and no guarding.  Musculoskeletal:  Moving all four extr freely and voluntarily  Neurological: He is alert and oriented to person, place, and time.  Skin: Skin is warm.  Psychiatric: He has a normal mood and affect.    ED Course  Procedures (including critical care time) Labs Reviewed  CBC WITH DIFFERENTIAL - Abnormal; Notable for the following:    WBC 26.0 (*)    RBC 4.18 (*)    Hemoglobin 12.1 (*)    HCT 37.7 (*)    Monocytes Relative 14 (*)    Neutro Abs 19.0 (*)    Monocytes Absolute 3.6 (*)    All other components within normal limits  COMPREHENSIVE METABOLIC PANEL - Abnormal; Notable for the following:    Glucose, Bld 151 (*)    BUN 32 (*)    GFR calc non Af Amer 59 (*)    GFR calc Af Amer 68 (*)    All other components within normal limits  PROTIME-INR - Abnormal; Notable for the following:    Prothrombin Time 30.4 (*)    INR 3.04 (*)    All other components within normal limits  PRO B NATRIURETIC PEPTIDE - Abnormal; Notable for the following:    Pro B Natriuretic peptide (BNP) 2625.0 (*)     All other components within normal limits  URINALYSIS, ROUTINE W REFLEX MICROSCOPIC - Abnormal; Notable for the following:    APPearance CLOUDY (*)    All other components within normal limits  GLUCOSE, CAPILLARY - Abnormal; Notable for the following:    Glucose-Capillary 137 (*)    All other components within normal limits  POCT I-STAT, CHEM 8 - Abnormal; Notable for the following:    BUN 35 (*)    Glucose, Bld 149 (*)    All other components within normal limits  POCT I-STAT 3, BLOOD GAS (G3+) - Abnormal; Notable for the following:    pCO2 arterial 46.4 (*)    pO2, Arterial 75.0 (*)    Bicarbonate 25.9 (*)    All other components within normal limits  CULTURE, BLOOD (ROUTINE X 2)  CULTURE, BLOOD (ROUTINE X 2)  MRSA PCR SCREENING  TROPONIN I  STREP PNEUMONIAE URINARY ANTIGEN  LEGIONELLA ANTIGEN, URINE  CBC  BASIC METABOLIC PANEL  PROTIME-INR  POCT I-STAT TROPONIN I  CG4 I-STAT (LACTIC ACID)   Dg Chest Portable 1 View  02/24/2013   *RADIOLOGY REPORT*  Clinical Data: Shortness of breath, COPD, smoking history  PORTABLE CHEST - 1 VIEW  Comparison: Chest x-ray of 02/22/2013  Findings: The lungs remain hyperaerated consistent with emphysema. Minimally prominent markings are present at the left lung base and pneumonia cannot be excluded.  Follow-up is recommended if clinically concerned.  Cardiomegaly is stable and a permanent pacemaker remains.  IMPRESSION:  1.  Emphysema. 2.  Minimally prominent markings at the left lung base.  Consider follow-up if warranted clinically. 2.  Stable cardiomegaly with pacer.   Original Report Authenticated By: Dwyane Dee, M.D.   1. Respiratory failure   2. Acute respiratory failure   3. Atrial fibrillation   4. CAD (coronary artery disease)   5. Complete heart block   6. COPD (chronic obstructive pulmonary disease)   7. HCAP (healthcare-associated pneumonia)    Results for orders placed during the hospital encounter of 02/24/13  CBC WITH  DIFFERENTIAL      Result Value Range   WBC 26.0 (*) 4.0 - 10.5 K/uL   RBC 4.18 (*) 4.22 - 5.81 MIL/uL   Hemoglobin 12.1 (*) 13.0 - 17.0 g/dL   HCT 84.6 (*) 96.2 - 95.2 %   MCV 90.2  78.0 - 100.0 fL   MCH 28.9  26.0 - 34.0 pg   MCHC 32.1  30.0 - 36.0 g/dL   RDW 84.1  32.4 - 40.1 %   Platelets 220  150 - 400 K/uL   Neutrophils Relative % 73  43 - 77 %   Lymphocytes Relative 13  12 - 46 %   Monocytes Relative 14 (*) 3 - 12 %   Eosinophils Relative 0  0 - 5 %   Basophils Relative 0  0 - 1 %   Neutro Abs 19.0 (*) 1.7 - 7.7 K/uL   Lymphs Abs 3.4  0.7 - 4.0 K/uL   Monocytes Absolute 3.6 (*) 0.1 - 1.0 K/uL   Eosinophils Absolute 0.0  0.0 - 0.7 K/uL   Basophils Absolute 0.0  0.0 - 0.1 K/uL   Smear Review MORPHOLOGY UNREMARKABLE    COMPREHENSIVE METABOLIC PANEL      Result Value Range   Sodium 136  135 - 145 mEq/L   Potassium 4.3  3.5 - 5.1 mEq/L   Chloride 100  96 - 112 mEq/L   CO2 28  19 - 32 mEq/L   Glucose, Bld 151 (*) 70 - 99 mg/dL   BUN 32 (*) 6 - 23 mg/dL   Creatinine, Ser 0.27  0.50 - 1.35 mg/dL   Calcium 8.8  8.4 - 25.3 mg/dL   Total Protein 7.2  6.0 - 8.3 g/dL   Albumin 3.7  3.5 - 5.2 g/dL   AST 32  0 - 37 U/L   ALT 18  0 - 53 U/L   Alkaline Phosphatase 78  39 - 117 U/L   Total Bilirubin 0.8  0.3 - 1.2 mg/dL   GFR calc non Af Amer 59 (*) >90 mL/min   GFR calc Af Amer 68 (*) >90 mL/min  PROTIME-INR      Result Value Range   Prothrombin Time 30.4 (*) 11.6 - 15.2 seconds   INR 3.04 (*) 0.00 - 1.49  TROPONIN I      Result Value Range   Troponin I <0.30  <0.30 ng/mL  PRO B NATRIURETIC PEPTIDE  Result Value Range   Pro B Natriuretic peptide (BNP) 2625.0 (*) 0 - 450 pg/mL  URINALYSIS, ROUTINE W REFLEX MICROSCOPIC      Result Value Range   Color, Urine YELLOW  YELLOW   APPearance CLOUDY (*) CLEAR   Specific Gravity, Urine 1.012  1.005 - 1.030   pH 5.0  5.0 - 8.0   Glucose, UA NEGATIVE  NEGATIVE mg/dL   Hgb urine dipstick NEGATIVE  NEGATIVE   Bilirubin Urine  NEGATIVE  NEGATIVE   Ketones, ur NEGATIVE  NEGATIVE mg/dL   Protein, ur NEGATIVE  NEGATIVE mg/dL   Urobilinogen, UA 0.2  0.0 - 1.0 mg/dL   Nitrite NEGATIVE  NEGATIVE   Leukocytes, UA NEGATIVE  NEGATIVE  GLUCOSE, CAPILLARY      Result Value Range   Glucose-Capillary 137 (*) 70 - 99 mg/dL  POCT I-STAT TROPONIN I      Result Value Range   Troponin i, poc 0.05  0.00 - 0.08 ng/mL   Comment 3           CG4 I-STAT (LACTIC ACID)      Result Value Range   Lactic Acid, Venous 1.94  0.5 - 2.2 mmol/L  POCT I-STAT, CHEM 8      Result Value Range   Sodium 139  135 - 145 mEq/L   Potassium 4.4  3.5 - 5.1 mEq/L   Chloride 103  96 - 112 mEq/L   BUN 35 (*) 6 - 23 mg/dL   Creatinine, Ser 4.09  0.50 - 1.35 mg/dL   Glucose, Bld 811 (*) 70 - 99 mg/dL   Calcium, Ion 9.14  7.82 - 1.30 mmol/L   TCO2 26  0 - 100 mmol/L   Hemoglobin 13.6  13.0 - 17.0 g/dL   HCT 95.6  21.3 - 08.6 %  POCT I-STAT 3, BLOOD GAS (G3+)      Result Value Range   pH, Arterial 7.353  7.350 - 7.450   pCO2 arterial 46.4 (*) 35.0 - 45.0 mmHg   pO2, Arterial 75.0 (*) 80.0 - 100.0 mmHg   Bicarbonate 25.9 (*) 20.0 - 24.0 mEq/L   TCO2 27  0 - 100 mmol/L   O2 Saturation 94.0     Patient temperature 98.0 F     Collection site RADIAL, ALLEN'S TEST ACCEPTABLE     Drawn by Operator     Sample type ARTERIAL       Date: 02/24/2013  Rate: 68  Rhythm: atrial fibrillation  QRS Axis: left  Intervals: QT prolonged  ST/T Wave abnormalities: nonspecific ST changes  Conduction Disutrbances:difficult to interpret due to a fib  Narrative Interpretation:   Old EKG Reviewed: reviewed and unchanged  MDM  [name] 79 y.o. who presents with worsening shortness of breath. Initial respiratory rate was in the low 30s. Initial oxygen saturation was in the upper 50s with an appropriate waveform. There was significant accessory muscle usage respiratory distress. Nasal cannula oxygen and improved saturations to the 80s and patient was placed on  BiPAP. The patient is afebrile here and denies fever  at home. He also denies cough. Respiratory status significantly improved with BiPAP. BNP is 2600. White blood cell count 26,000. Hemoglobin stable. Troponin negative. EKG is similar to previous. Chest x-ray significant for left lower lobe edema versus infiltrate. I suspected to be related to a pulmonary contusion  related to the overlying rib fractures. Followup blood gas showed nml pH and slightly elevated CO2. Held ABX as LA was wnls. Gave  Lasix as well. Resp status much improved. Will admit to cardiology for COPD exacerbation vs CHF exacerbation. Patient was admitted without any events. Labs, imaging reviewed. I discussed this patient's care with my attending, Dr. Manus Gunning.          Sena Hitch, MD 02/25/13 [phone]

## 2013-02-24 NOTE — ED Notes (Signed)
Pt c/o didd breathing for 2-3 days.  sats 89% on arrival.  Pt alert c/o lt lower rib pain from a fall earlier.  Placed on bi=pap wife at the bedside

## 2013-02-24 NOTE — ED Notes (Signed)
The pt voided 

## 2013-02-24 NOTE — ED Notes (Signed)
The pt just d/cd from this hospital yesterday after being admitted for the fall on Monday.  The pt usues 02 at night only and he still smokes every day

## 2013-02-25 ENCOUNTER — Inpatient Hospital Stay (HOSPITAL_COMMUNITY): Payer: Medicare Other

## 2013-02-25 LAB — GLUCOSE, CAPILLARY
Glucose-Capillary: 133 mg/dL — ABNORMAL HIGH (ref 70–99)
Glucose-Capillary: 183 mg/dL — ABNORMAL HIGH (ref 70–99)

## 2013-02-25 LAB — BASIC METABOLIC PANEL
GFR calc Af Amer: 70 mL/min — ABNORMAL LOW (ref >90)
GFR calc non Af Amer: 61 mL/min — ABNORMAL LOW (ref >90)
Glucose, Bld: 147 mg/dL — ABNORMAL HIGH (ref 70–99)
Potassium: 4 mEq/L (ref 3.5–5.1)
Sodium: 137 mEq/L (ref 135–145)

## 2013-02-25 LAB — PROTIME-INR: INR: 3.54 — ABNORMAL HIGH (ref 0.00–1.49)

## 2013-02-25 LAB — CBC
Hemoglobin: 10.2 g/dL — ABNORMAL LOW (ref 13.0–17.0)
MCHC: 32.8 g/dL (ref 30.0–36.0)

## 2013-02-25 LAB — MRSA PCR SCREENING: MRSA by PCR: NEGATIVE

## 2013-02-25 MED ORDER — NITROGLYCERIN 0.4 MG SL SUBL: 0.4000 mg | SUBLINGUAL_TABLET | SUBLINGUAL | Status: DC | PRN

## 2013-02-25 MED ORDER — SIMVASTATIN 10 MG PO TABS
10.0000 mg | ORAL_TABLET | Freq: Every day | ORAL | Status: DC
  Administered 2013-02-25 – 2013-03-04 (×8): 10 mg via ORAL
  Filled 2013-02-25 (×8): qty 1

## 2013-02-25 MED ORDER — FUROSEMIDE 20 MG PO TABS
20.0000 mg | ORAL_TABLET | Freq: Every day | ORAL | Status: DC
  Administered 2013-02-25 – 2013-03-04 (×8): 20 mg via ORAL
  Filled 2013-02-25 (×9): qty 1

## 2013-02-25 MED ORDER — PANTOPRAZOLE SODIUM 40 MG PO TBEC
40.0000 mg | DELAYED_RELEASE_TABLET | Freq: Every day | ORAL | Status: DC
  Administered 2013-02-25 – 2013-03-04 (×8): 40 mg via ORAL
  Filled 2013-02-25 (×9): qty 1

## 2013-02-25 MED ORDER — OXYCODONE HCL 5 MG PO TABS
5.0000 mg | ORAL_TABLET | Freq: Four times a day (QID) | ORAL | Status: DC | PRN
  Administered 2013-02-25 – 2013-03-04 (×11): 5 mg via ORAL
  Filled 2013-02-25 (×11): qty 1

## 2013-02-25 MED ORDER — ADULT MULTIVITAMIN W/MINERALS CH
1.0000 | ORAL_TABLET | Freq: Every day | ORAL | Status: DC
  Administered 2013-02-25 – 2013-03-04 (×8): 1 via ORAL
  Filled 2013-02-25 (×8): qty 1

## 2013-02-25 NOTE — Progress Notes (Signed)
Subjective:  Significant improvement in fever, elevated temperature and WBC count with Vancomycin and Zosyn use. Afebrile.  Objective:  Vital Signs in the last 24 hours: Temp:  [97.9 F (36.6 C)-98.4 F (36.9 C)] 98.1 F (36.7 C) (07/03 0755) Pulse Rate:  [58-73] 65 (07/03 0800) Cardiac Rhythm:  [-] Ventricular paced (07/03 0400) Resp:  [15-30] 16 (07/03 0800) BP: (102-168)/(36-60) 115/52 mmHg (07/03 0800) SpO2:  [90 %-100 %] 93 % (07/03 0904) FiO2 (%):  [50 %] 50 % (07/02 1545) Weight:  [74.5 kg (164 lb 3.9 oz)] 74.5 kg (164 lb 3.9 oz) (07/02 2053)  Physical Exam: BP Readings from Last 1 Encounters:  02/25/13 115/52     Wt Readings from Last 1 Encounters:  02/24/13 74.5 kg (164 lb 3.9 oz)    Weight change:   HEENT: [place]/AT, Eyes-Blue, PERL, EOMI, Conjunctiva-Pink, Sclera-Non-icteric Neck: No JVD, No bruit, Trachea midline. Lungs:  Clearing with decreased air entry at bases, bilaterally. Cardiac:  Regular rhythm, normal S1 and S2, no S3.  Abdomen:  Soft, non-tender. Extremities:  No edema present. No cyanosis. +ve clubbing. CNS: AxOx3, Cranial nerves grossly intact, moves all 4 extremities. Right handed. Skin: Warm and dry.   Intake/Output from previous day: 07/02 0701 - 07/03 0700 In: 1221.3 [I.V.:721.3; IV Piggyback:500] Out: 500 [Urine:500]    Lab Results: BMET    Component Value Date/Time   NA 137 02/25/2013 0430   K 4.0 02/25/2013 0430   CL 103 02/25/2013 0430   CO2 23 02/25/2013 0430   GLUCOSE 147* 02/25/2013 0430   BUN 34* 02/25/2013 0430   CREATININE 1.11 02/25/2013 0430   CALCIUM 8.5 02/25/2013 0430   GFRNONAA 61* 02/25/2013 0430   GFRAA 70* 02/25/2013 0430   CBC    Component Value Date/Time   WBC 13.6* 02/25/2013 0430   RBC 3.52* 02/25/2013 0430   HGB 10.2* 02/25/2013 0430   HCT 31.1* 02/25/2013 0430   PLT 165 02/25/2013 0430   MCV 88.4 02/25/2013 0430   MCH 29.0 02/25/2013 0430   MCHC 32.8 02/25/2013 0430   RDW 14.2 02/25/2013 0430   LYMPHSABS 3.4 02/24/2013 1556   MONOABS 3.6*  02/24/2013 1556   EOSABS 0.0 02/24/2013 1556   BASOSABS 0.0 02/24/2013 1556   CARDIAC ENZYMES Lab Results  Component Value Date   CKTOTAL 87 01/08/2012   CKMB 5.9* 01/08/2012   TROPONINI <0.30 02/24/2013    Scheduled Meds: . albuterol  2.5 mg Nebulization Q6H  . guaiFENesin  600 mg Oral BID  . ipratropium  0.5 mg Nebulization Q6H  . piperacillin-tazobactam (ZOSYN)  IV  3.375 g Intravenous Q8H  . sodium chloride  3 mL Intravenous Q12H  . tiotropium  18 mcg Inhalation Daily  . vancomycin  750 mg Intravenous Q12H   Continuous Infusions:  PRN Meds:.acetaminophen, albuterol, fentaNYL, traMADol  Assessment/Plan: Acute exacerbation of COPD  HTN  A. Fibrillation  Perm. Pacemaker implant.  CAD Possible pneumonia or atelectasis  Continue medical treatment. Up in chair as tolerated. Incentive spirometry.   LOS: 1 day    Orpah Cobb  MD  02/25/2013, 9:42 AM

## 2013-02-25 NOTE — Consult Note (Signed)
PULMONARY  / CRITICAL CARE MEDICINE  Name: Corey HURTA MRN: [id number] DOB: 1931/06/04    ADMISSION DATE:  02/24/2013 CONSULTATION DATE:  02/24/2013  REFERRING MD :  Algie Coffer PRIMARY SERVICE: Cardiology  CHIEF COMPLAINT:  Shortness of breath, chest pain  BRIEF PATIENT DESCRIPTION: 79 y/o male with COPD was discharged from [facility] on 7/1 after a brief hospitalization for chest pain from a rib fracture returned to the hospital on 7/2 with recurrent dyspnea and chest pain.  PCCM consulted for BIPAP management.  SIGNIFICANT EVENTS / STUDIES:    LINES / TUBES: 7/2 BIPAP >>   CULTURES: none  ANTIBIOTICS: 7/2 ceftriaxone >> 7/2 azithro >>    SUBJECTIVE:  No distress, off NIMV  VITAL SIGNS: Temp:  [97.9 F (36.6 C)-98.4 F (36.9 C)] 98 F (36.7 C) (07/03 1054) Pulse Rate:  [58-73] 67 (07/03 1400) Resp:  [15-30] 21 (07/03 1400) BP: (102-168)/(36-75) 114/63 mmHg (07/03 1400) SpO2:  [90 %-100 %] 95 % (07/03 1417) FiO2 (%):  [50 %] 50 % (07/02 1545) Weight:  [74.5 kg (164 lb 3.9 oz)] 74.5 kg (164 lb 3.9 oz) (07/02 2053) HEMODYNAMICS:   VENTILATOR SETTINGS: Vent Mode:  [-] BIPAP FiO2 (%):  [50 %] 50 % Set Rate:  [15 bmp] 15 bmp PEEP:  [5 cmH20] 5 cmH20 INTAKE / OUTPUT: Intake/Output     07/02 0701 - 07/03 0700 07/03 0701 - 07/04 0700   P.O.  400   I.V. (mL/kg) 721.3 (9.7) 75 (1)   IV Piggyback 575 500   Total Intake(mL/kg) 1296.3 (17.4) 975 (13.1)   Urine (mL/kg/hr) 500 445 (0.8)   Total Output 500 445   Net +796.3 +530        Urine Occurrence 1 x      PHYSICAL EXAMINATION:  No distress HEENT: jvd wnl PULM: exp wheezing  Mild  CV: RRR, loud systolic murmur LLSB, no JVD AB: BS+, soft, nontender, no hsm Ext: warm, no edema Derm: no rash or skin breakdown Neuro: Awake and alert, oriented to 4   LABS:  Recent Labs Lab 02/22/13 1528  02/23/13 0516 02/24/13 1556 02/24/13 1557 02/24/13 1606 02/24/13 1651 02/25/13 0430  HGB 12.9*  < > 11.2* 12.1*  13.6  --    --   --  10.2*  WBC 17.9*  < > 11.4* 26.0*  --   --   --  13.6*  PLT 226  < > 205 220  --   --   --  165  NA 140  --  137 136  139  --   --   --  137  K 4.7  --  4.2 4.3  4.4  --   --   --  4.0  CL 101  --  102 100  103  --   --   --  103  CO2 29  --  25 28  --   --   --  23  GLUCOSE 113*  --  155* 151*  149*  --   --   --  147*  BUN 17  --  25* 32*  35*  --   --   --  34*  CREATININE 1.08  < > 1.08 1.14  1.30  --   --   --  1.11  CALCIUM 9.3  --  8.7 8.8  --   --   --  8.5  MG  --   --  1.9  --   --   --   --   --  AST 29  --   --  32  --   --   --   --   ALT 15  --   --  18  --   --   --   --   ALKPHOS 89  --   --  78  --   --   --   --   BILITOT 0.5  --   --  0.8  --   --   --   --   PROT 7.2  --   --  7.2  --   --   --   --   ALBUMIN 4.0  --   --  3.7  --   --   --   --   INR 2.24*  --  2.16* 3.04*  --   --   --  3.54*  LATICACIDVEN  --   --   --   --   --  1.94  --   --   TROPONINI  --   --   --   --  <0.30  --   --   --   PROBNP  --   --   --   --  2625.0*  --   --   --   PHART  --   --   --   --   --   --  7.353  --   PCO2ART  --   --   --   --   --   --  46.4*  --   PO2ART  --   --   --   --   --   --  75.0*  --   < > = values in this interval not displayed.  Recent Labs Lab 02/24/13 2048 02/24/13 2347 02/25/13 0416 02/25/13 0754 02/25/13 1053  GLUCAP 137* 143* 152* 133* 183*    CXR: Emphysema and ? insterstitial scarring vs bullae bilaterally; possible left lower lobe infiltrate  ASSESSMENT / PLAN:  PULMONARY A: Respiratory failure due to splinting in setting of COPD; markedly improved off NIMV Possible HCAP, difficult to determine Component edema? P:   -fentanyl prn for splinting -BIPAP prn, dc -scheduled and prn albuterol -O2 to goal sats 88-92% -lasix  CARDIOVASCULAR A: Afib Complete heart block s/p pacer P:  -per cardiology  RENAL A:  Unclear volume status P:   -lasix pr cards  GASTROINTESTINAL A:  No distress P:   -advance diet if  respiratory status remains stable  HEMATOLOGIC A:  Warfarin use for afib P:  -monitor for bleeding, cbc in am  -per cardiology  INFECTIOUS A:  HCAP? Unlikely with rapid resolution resp distress P:   -cover empirically with vanc/zosyn -repeat CXR -consider pct algo to limit abx  ENDOCRINE A:  No acute issues P:   -monitor glucose  NEUROLOGIC A:  No acute issues P:   -frequent orientation, minimize sedating meds  Code status: he and his wife confirm that he is full no code blue (DNAR/DNI)  TODAY'S SUMMARY: improved, control pain, tx to floors, will sign off to cardiology, pct algo to limit abx  Call if needed  [facility] J.,MD Pulmonary and Critical Care Medicine [facility] Pager: [phone]  02/25/2013, 2:28 PM

## 2013-02-25 NOTE — ED Provider Notes (Signed)
I saw and evaluated the patient, reviewed the resident's note and I agree with the findings and plan. If applicable, I agree with the resident's interpretation of the EKG.  If applicable, I was present for critical portions of any procedures performed.  Discharged yesterday after admission for rib fracture presenting with weakness and respiratory distress. tachypneic and hypoxic to 50s on arrival.  Rhonchi with decreased at L base. Placed on NRB and then bipap with good response.  Suspect COPD exacerbation.  Nebs, steroids, ABG.  Possible HCAP on CXR.  No PTX. D/w Dr. Algie Coffer and Dr. Kendrick Fries. CRITICAL CARE Performed by: Glynn Octave Total critical care time: 30 Critical care time was exclusive of separately billable procedures and treating other patients. Critical care was necessary to treat or prevent imminent or life-threatening deterioration. Critical care was time spent personally by me on the following activities: development of treatment plan with patient and/or surrogate as well as nursing, discussions with consultants, evaluation of patient's response to treatment, examination of patient, obtaining history from patient or surrogate, ordering and performing treatments and interventions, ordering and review of laboratory studies, ordering and review of radiographic studies, pulse oximetry and re-evaluation of patient's condition.    Glynn Octave, MD 02/25/13 [phone]

## 2013-02-25 NOTE — Care Management Note (Addendum)
    Page 1 of 1   03/04/2013     2:53:56 PM   CARE MANAGEMENT NOTE 03/04/2013  Patient:  [name], [name]   Account Number:  [id number]  Date Initiated:  02/25/2013  Documentation initiated by:  [facility]  Subjective/Objective Assessment:   Admitted with SOB - on bipap  Lives with wife - had had [facility] in past     Action/Plan:   Anticipated DC Date:  03/04/2013   Anticipated DC Plan:  SKILLED NURSING FACILITY  In-house referral  Clinical Social Worker      DC Planning Services  CM consult      Choice offered to / List presented to:             Status of service:  Completed, signed off Medicare Important Message given?   (If response is "NO", the following Medicare IM given date fields will be blank) Date Medicare IM given:   Date Additional Medicare IM given:    Discharge Disposition:  SKILLED NURSING FACILITY  Per UR Regulation:  Reviewed for med. necessity/level of care/duration of stay  If discussed at Long Length of Stay Meetings, dates discussed:    Comments:  ContactDarrall, [place] [phone]   [phone]  03/04/13 Brodan Grewell,RN,BSN [phone] PT FOR DC TODAY SNF TODAY, PER CSW ARRANGEMENTS.  03/02/13 Chereese Cilento,RN,BSN [phone] P.T. CONSULT COMPLETED.  RECOMMENDATION IS FOR SHORT TERM SNF FOR REHAB.  WILL CONSULT CSW TO FACILITATE DC TO SNF WHEN MEDICALLY STABLE FOR DC.  03/01/13 Calina Patrie,RN,BSN [phone] MD:  PLEASE ORDER PT/OT CONSULTS TO DETERMINE HOME NEEDS AS DISCHARGE.  THANKS!

## 2013-02-25 NOTE — Progress Notes (Signed)
Yellow colored ring removed from patients left hand upon arrival to [facility]. Placed into a closed cup at patients bedside with his name and patient identification information on it. Patient shown that this was removed and where it was being stored in room.  ED RN reports that patient is [facility]- hearing aides taken home by wife prior to admission to [facility]. ED RN reports that patient has glasses and dentures that have also been taken home by wife prior to admission to [facility].

## 2013-02-26 ENCOUNTER — Inpatient Hospital Stay (HOSPITAL_COMMUNITY): Payer: Medicare Other

## 2013-02-26 LAB — BASIC METABOLIC PANEL
BUN: 32 mg/dL — ABNORMAL HIGH (ref 6–23)
CO2: 27 mEq/L (ref 19–32)
Calcium: 8.8 mg/dL (ref 8.4–10.5)
Creatinine, Ser: 1.15 mg/dL (ref 0.50–1.35)
GFR calc Af Amer: 67 mL/min — ABNORMAL LOW (ref >90)

## 2013-02-26 LAB — CBC
MCHC: 32.5 g/dL (ref 30.0–36.0)
MCV: 87.5 fL (ref 78.0–100.0)
Platelets: 181 10*3/uL (ref 150–400)
RDW: 14.2 % (ref 11.5–15.5)
WBC: 18 10*3/uL — ABNORMAL HIGH (ref 4.0–10.5)

## 2013-02-26 LAB — LEGIONELLA ANTIGEN, URINE

## 2013-02-26 LAB — PROTIME-INR
INR: 3.23 — ABNORMAL HIGH (ref 0.00–1.49)
Prothrombin Time: 31.8 seconds — ABNORMAL HIGH (ref 11.6–15.2)

## 2013-02-26 LAB — PROCALCITONIN: Procalcitonin: 0.14 ng/mL

## 2013-02-26 NOTE — Progress Notes (Signed)
Subjective:  No new complaints  Afebrile.  Objective:  Vital Signs in the last 24 hours: Temp:  [97.5 F (36.4 C)-98.5 F (36.9 C)] 98.2 F (36.8 C) (07/04 1410) Pulse Rate:  [66-73] 67 (07/04 1410) Cardiac Rhythm:  [-] Ventricular paced (07/04 0710) Resp:  [18-20] 18 (07/04 1410) BP: (107-126)/(54-59) 115/54 mmHg (07/04 1410) SpO2:  [93 %-94 %] 94 % (07/04 1410) Weight:  [74.8 kg (164 lb 14.5 oz)] 74.8 kg (164 lb 14.5 oz) (07/04 0539)  Physical Exam: BP Readings from Last 1 Encounters:  02/26/13 115/54     Wt Readings from Last 1 Encounters:  02/26/13 74.8 kg (164 lb 14.5 oz)    Weight change: 0.3 kg (10.6 oz)  HEENT: [name]/AT, Eyes-Blue, PERL, EOMI, Conjunctiva-Pink, Sclera-Non-icteric Neck: No JVD, No bruit, Trachea midline. Lungs: Few basal crackles bilateral. Cardiac:  Regular rhythm, normal S1 and S2, no S3.  Abdomen:  Soft, non-tender. Extremities:  No edema present. No cyanosis. Positive clubbing. CNS: AxOx3, Cranial nerves grossly intact, moves all 4 extremities. Right handed. Skin: Warm and dry.   Intake/Output from previous day: 07/03 0701 - 07/04 0700 In: 975 [P.O.:400; I.V.:75; IV Piggyback:500] Out: 795 [Urine:795]    Lab Results: BMET    Component Value Date/Time   NA 138 02/26/2013 0530   K 4.0 02/26/2013 0530   CL 102 02/26/2013 0530   CO2 27 02/26/2013 0530   GLUCOSE 119* 02/26/2013 0530   BUN 32* 02/26/2013 0530   CREATININE 1.15 02/26/2013 0530   CALCIUM 8.8 02/26/2013 0530   GFRNONAA 58* 02/26/2013 0530   GFRAA 67* 02/26/2013 0530   CBC    Component Value Date/Time   WBC 18.0* 02/26/2013 0530   RBC 3.59* 02/26/2013 0530   HGB 10.2* 02/26/2013 0530   HCT 31.4* 02/26/2013 0530   PLT 181 02/26/2013 0530   MCV 87.5 02/26/2013 0530   MCH 28.4 02/26/2013 0530   MCHC 32.5 02/26/2013 0530   RDW 14.2 02/26/2013 0530   LYMPHSABS 3.4 02/24/2013 1556   MONOABS 3.6* 02/24/2013 1556   EOSABS 0.0 02/24/2013 1556   BASOSABS 0.0 02/24/2013 1556   CARDIAC ENZYMES Lab Results  Component  Value Date   CKTOTAL 87 01/08/2012   CKMB 5.9* 01/08/2012   TROPONINI <0.30 02/24/2013    Scheduled Meds: . albuterol  2.5 mg Nebulization Q6H  . furosemide  20 mg Oral Daily  . guaiFENesin  600 mg Oral BID  . ipratropium  0.5 mg Nebulization Q6H  . multivitamin with minerals  1 tablet Oral Daily  . pantoprazole  40 mg Oral Daily  . piperacillin-tazobactam (ZOSYN)  IV  3.375 g Intravenous Q8H  . simvastatin  10 mg Oral Daily  . sodium chloride  3 mL Intravenous Q12H  . tiotropium  18 mcg Inhalation Daily   Continuous Infusions:  PRN Meds:.acetaminophen, albuterol, fentaNYL, nitroGLYCERIN, oxyCODONE, traMADol  Assessment/Plan:  @HMPROB @  LOS: 2 days   Acute exacerbation of COPD  HTN  A. Fibrillation  Perm. Pacemaker implant.  CAD  Possible pneumonia or atelectasis Acute re  Continue antibiotics    Corey Cobb  MD  02/26/2013, 3:43 PM

## 2013-02-27 LAB — PROCALCITONIN: Procalcitonin: <0.1 ng/mL

## 2013-02-27 MED ORDER — WARFARIN SODIUM 7.5 MG PO TABS
7.5000 mg | ORAL_TABLET | Freq: Once | ORAL | Status: AC
  Administered 2013-02-27: 7.5 mg via ORAL
  Filled 2013-02-27: qty 1

## 2013-02-27 MED ORDER — WARFARIN - PHYSICIAN DOSING INPATIENT
Freq: Every day | Status: DC
  Administered 2013-02-27 – 2013-03-02 (×4)

## 2013-02-27 NOTE — Progress Notes (Signed)
Pt requesting neb treatment; RT paged at this time; will cont. To monitor.

## 2013-02-27 NOTE — Progress Notes (Signed)
RT at bedside at this time.

## 2013-02-27 NOTE — Progress Notes (Signed)
Subjective:  Some shortness of breath. T max 99.6. Low INR.  Objective:  Vital Signs in the last 24 hours: Temp:  [98.2 F (36.8 C)-99.6 F (37.6 C)] 98.7 F (37.1 C) (07/05 0452) Pulse Rate:  [65-68] 65 (07/05 0452) Cardiac Rhythm:  [-] Ventricular paced (07/05 0832) Resp:  [18-19] 19 (07/05 0452) BP: (115-146)/(54-68) 133/65 mmHg (07/05 0452) SpO2:  [90 %-94 %] 91 % (07/05 0452) Weight:  [74.6 kg (164 lb 7.4 oz)] 74.6 kg (164 lb 7.4 oz) (07/05 0452)  Physical Exam: BP Readings from Last 1 Encounters:  02/27/13 133/65     Wt Readings from Last 1 Encounters:  02/27/13 74.6 kg (164 lb 7.4 oz)    Weight change: -0.2 kg (-7.1 oz)  HEENT: [place]/AT, Eyes-Blue, PERL, EOMI, Conjunctiva-Pink, Sclera-Non-icteric Neck: No JVD, No bruit, Trachea midline. Lungs:  Clearing but decreased air entry, Bilateral. Cardiac:  Regular rhythm, normal S1 and S2, no S3.  Abdomen:  Soft, non-tender. Extremities:  No edema present. No cyanosis. No clubbing. CNS: AxOx3, Cranial nerves grossly intact, moves all 4 extremities. Right handed. Skin: Warm and dry.   Intake/Output from previous day: 07/04 0701 - 07/05 0700 In: 180 [P.O.:180] Out: 1125 [Urine:1125]    Lab Results: BMET    Component Value Date/Time   NA 138 02/26/2013 0530   K 4.0 02/26/2013 0530   CL 102 02/26/2013 0530   CO2 27 02/26/2013 0530   GLUCOSE 119* 02/26/2013 0530   BUN 32* 02/26/2013 0530   CREATININE 1.15 02/26/2013 0530   CALCIUM 8.8 02/26/2013 0530   GFRNONAA 58* 02/26/2013 0530   GFRAA 67* 02/26/2013 0530   CBC    Component Value Date/Time   WBC 18.0* 02/26/2013 0530   RBC 3.59* 02/26/2013 0530   HGB 10.2* 02/26/2013 0530   HCT 31.4* 02/26/2013 0530   PLT 181 02/26/2013 0530   MCV 87.5 02/26/2013 0530   MCH 28.4 02/26/2013 0530   MCHC 32.5 02/26/2013 0530   RDW 14.2 02/26/2013 0530   LYMPHSABS 3.4 02/24/2013 1556   MONOABS 3.6* 02/24/2013 1556   EOSABS 0.0 02/24/2013 1556   BASOSABS 0.0 02/24/2013 1556   CARDIAC ENZYMES Lab Results  Component  Value Date   CKTOTAL 87 01/08/2012   CKMB 5.9* 01/08/2012   TROPONINI <0.30 02/24/2013    Scheduled Meds: . albuterol  2.5 mg Nebulization Q6H  . furosemide  20 mg Oral Daily  . guaiFENesin  600 mg Oral BID  . ipratropium  0.5 mg Nebulization Q6H  . multivitamin with minerals  1 tablet Oral Daily  . pantoprazole  40 mg Oral Daily  . piperacillin-tazobactam (ZOSYN)  IV  3.375 g Intravenous Q8H  . simvastatin  10 mg Oral Daily  . sodium chloride  3 mL Intravenous Q12H  . tiotropium  18 mcg Inhalation Daily  . warfarin  7.5 mg Oral ONCE-1800  . Warfarin - Physician Dosing Inpatient   Does not apply q1800   Continuous Infusions:  PRN Meds:.acetaminophen, albuterol, fentaNYL, nitroGLYCERIN, oxyCODONE, traMADol  Assessment/Plan:  Acute exacerbation of COPD  HTN  A. Fibrillation  Perm. Pacemaker implant.  CAD  Possible pneumonia or atelectasis  Incentive spirometry Lab in AM.   LOS: 3 days    Orpah Cobb  MD  02/27/2013, 11:43 AM

## 2013-02-27 NOTE — Progress Notes (Signed)
ANTIBIOTIC CONSULT NOTE - Follow Up Consult  Pharmacy Consult for Vancomycin, Zosyn and Warfarin (per MD) Indication: AECOPD  No Known Allergies  Patient Measurements: Height: 6\' 1"  (185.4 cm) Weight: 164 lb 7.4 oz (74.6 kg) IBW/kg (Calculated) : 79.9  Vital Signs: Temp: 98.7 F (37.1 C) (07/05 0452) Temp src: Oral (07/05 0452) BP: 133/65 mmHg (07/05 0452) Pulse Rate: 65 (07/05 0452)  Labs:  Recent Labs  02/24/13 1556 02/24/13 1557 02/25/13 0430 02/26/13 0530 02/27/13 0555  HGB 12.1*  13.6  --  10.2* 10.2*  --   HCT 37.7*  40.0  --  31.1* 31.4*  --   PLT 220  --  165 181  --   LABPROT 30.4*  --  34.1* 31.8* 21.9*  INR 3.04*  --  3.54* 3.23* 1.98*  CREATININE 1.14  1.30  --  1.11 1.15  --   TROPONINI  --  <0.30  --   --   --     Estimated Creatinine Clearance: 54.1 ml/min (by C-G formula based on Cr of 1.15).   Medical History: Past Medical History  Diagnosis Date  . COPD (chronic obstructive pulmonary disease)   . Prostate cancer 2003  . Stroke 2000  . CAD (coronary artery disease)   . AF (atrial fibrillation)     permanent  . HTN (hypertension)   . Pneumothorax, right   . Complete heart block     S/P pacemaker insertion// [employer] RFSR  . Pacemaker   . CHF (congestive heart failure)   . Pneumonia     hx of on right   . Tobacco abuse     Medications:  Prescriptions prior to admission  Medication Sig Dispense Refill  . albuterol (PROVENTIL HFA;VENTOLIN HFA) 108 (90 BASE) MCG/ACT inhaler Inhale 2 puffs into the lungs every 6 (six) hours as needed for wheezing.      Marland Kitchen amLODipine (NORVASC) 5 MG tablet Take 1 tablet (5 mg total) by mouth daily.  30 tablet  3  . CALCIUM-VITAMIN D PO Take 1 tablet by mouth daily.      . carvedilol (COREG) 3.125 MG tablet Take 3.125 mg by mouth 2 (two) times daily with a meal.      . furosemide (LASIX) 20 MG tablet Take 20 mg by mouth daily.      Marland Kitchen losartan (COZAAR) 50 MG tablet Take 50 mg by mouth every evening.       . Multiple Vitamin (MULITIVITAMIN WITH MINERALS) TABS Take 1 tablet by mouth daily.      . nitroGLYCERIN (NITROSTAT) 0.4 MG SL tablet Place 0.4 mg under the tongue every 5 (five) minutes as needed. For chest pain      . oxyCODONE (OXY IR/ROXICODONE) 5 MG immediate release tablet Take 1 tablet (5 mg total) by mouth every 8 (eight) hours as needed for pain.  60 tablet  0  . pantoprazole (PROTONIX) 40 MG tablet Take 40 mg by mouth daily.      . simvastatin (ZOCOR) 10 MG tablet Take 10 mg by mouth daily.       Marland Kitchen tiotropium (SPIRIVA) 18 MCG inhalation capsule Place 1 capsule (18 mcg total) into inhaler and inhale daily.  30 capsule  12  . warfarin (COUMADIN) 7.5 MG tablet Take 3.75-7.5 mg by mouth every evening. 1/2 tablet (3.75 mg) on Sunday and Wednesday, 1 tablet (7.5 mg) on all other days        Assessment: Admit Complaint: 79 yo M admitted 02/24/2013  SOB,  CP, fever 101 at home. Pharmacy consulted to dose antibiotics on 7/2   Events: Warfarin resumed PER MD 7/5  PMH: CVA  Anticoagulation:  afib (Home dose: 7.5mg /day except 3.75mg  Sun/Wed (last dose 02/23/13). INR down to 1.98. No bleeding noted. MD Resuming home dose today.  Infectious Disease: Zosyn D#4 for HCAP -?LLL infiltrate. (recent hosp - just d/c from [facility] 7/1),. La 1.94. PCT 0.14> <0.10 Afeb since admit. Wbc up to 18 (7/3)  7/2 Vanc>>7/3 7/2 Zosyn>>  7/2 Bldx 2>>  Cardiovascular: VSS. CAD, afib, HTN, pacemaker, HF, ECHO EF 50-55%. HR 60s, BP 130/60s  Nephrology: Creat and lytes ok.  Pulmonary: 2L [place]. COPD (home O2), h/o tobacco abuse. On triotropium. Hematology / Oncology: h/o prostate ca. CBC tomorrow PTA Medication Issues: norvasc, coreg, lasix, losartan, coumadin  Best Practices: warfarin, SCDs  Goal of Therapy:  INR 2-3 Monitor platelets by anticoagulation protocol: Yes Renal adjustment of antibiotics.   Plan:  1) Continue Zosyn 3.375gm IV q8h.  2) F/u  Coumadin per MD 3) F/u restart home meds - BP stable for  now   Thank you for allowing pharmacy to be a part of this patients care team.  Lovenia Kim Pharm.D., BCPS Clinical Pharmacist 02/27/2013 1:41 PM Pager: [phone] Phone: [phone]

## 2013-02-28 LAB — BASIC METABOLIC PANEL
Chloride: 102 mEq/L (ref 96–112)
GFR calc Af Amer: 67 mL/min — ABNORMAL LOW (ref >90)
Potassium: 3.8 mEq/L (ref 3.5–5.1)

## 2013-02-28 LAB — CBC
Platelets: 212 10*3/uL (ref 150–400)
RBC: 3.47 MIL/uL — ABNORMAL LOW (ref 4.22–5.81)
RDW: 14.4 % (ref 11.5–15.5)
WBC: 11.9 10*3/uL — ABNORMAL HIGH (ref 4.0–10.5)

## 2013-02-28 LAB — PROTIME-INR: INR: 1.91 — ABNORMAL HIGH (ref 0.00–1.49)

## 2013-02-28 MED ORDER — WARFARIN SODIUM 10 MG PO TABS
10.0000 mg | ORAL_TABLET | Freq: Once | ORAL | Status: AC
  Administered 2013-02-28: 10 mg via ORAL
  Filled 2013-02-28: qty 1

## 2013-02-28 NOTE — Progress Notes (Signed)
MD called for Coumadin dosing for today; new orders to be entered.

## 2013-02-28 NOTE — Progress Notes (Signed)
Subjective:  Minimal improvement. T max 99.6  Objective:  Vital Signs in the last 24 hours: Temp:  [97.3 F (36.3 C)-99.6 F (37.6 C)] 98 F (36.7 C) (07/06 1352) Pulse Rate:  [66-74] 73 (07/06 1352) Cardiac Rhythm:  [-] Ventricular paced (07/06 0817) Resp:  [18-20] 20 (07/06 1352) BP: (114-141)/(56-60) 114/60 mmHg (07/06 1352) SpO2:  [91 %-93 %] 93 % (07/06 1352) Weight:  [74.1 kg (163 lb 5.8 oz)] 74.1 kg (163 lb 5.8 oz) (07/06 0435)  Physical Exam: BP Readings from Last 1 Encounters:  02/28/13 114/60     Wt Readings from Last 1 Encounters:  02/28/13 74.1 kg (163 lb 5.8 oz)    Weight change: -0.5 kg (-1 lb 1.6 oz)  HEENT: [place]/AT, Eyes-Blue, PERL, EOMI, Conjunctiva-Pink, Sclera-Non-icteric Neck: No JVD, No bruit, Trachea midline. Lungs:  Clearing with decreased air entry at bases, Bilateral. Cardiac:  Regular rhythm, normal S1 and S2, no S3.  Abdomen:  Soft, non-tender. Extremities:  No edema present. No cyanosis. No clubbing. CNS: AxOx3, Cranial nerves grossly intact, moves all 4 extremities. Right handed. Skin: Warm and dry.   Intake/Output from previous day: 07/05 0701 - 07/06 0700 In: 580 [P.O.:480; IV Piggyback:100] Out: 450 [Urine:450]    Lab Results: BMET    Component Value Date/Time   NA 137 02/28/2013 0400   K 3.8 02/28/2013 0400   CL 102 02/28/2013 0400   CO2 31 02/28/2013 0400   GLUCOSE 132* 02/28/2013 0400   BUN 26* 02/28/2013 0400   CREATININE 1.16 02/28/2013 0400   CALCIUM 8.6 02/28/2013 0400   GFRNONAA 58* 02/28/2013 0400   GFRAA 67* 02/28/2013 0400   CBC    Component Value Date/Time   WBC 11.9* 02/28/2013 0400   RBC 3.47* 02/28/2013 0400   HGB 10.1* 02/28/2013 0400   HCT 31.1* 02/28/2013 0400   PLT 212 02/28/2013 0400   MCV 89.6 02/28/2013 0400   MCH 29.1 02/28/2013 0400   MCHC 32.5 02/28/2013 0400   RDW 14.4 02/28/2013 0400   LYMPHSABS 3.4 02/24/2013 1556   MONOABS 3.6* 02/24/2013 1556   EOSABS 0.0 02/24/2013 1556   BASOSABS 0.0 02/24/2013 1556   CARDIAC ENZYMES Lab Results   Component Value Date   CKTOTAL 87 01/08/2012   CKMB 5.9* 01/08/2012   TROPONINI <0.30 02/24/2013    Scheduled Meds: . albuterol  2.5 mg Nebulization Q6H  . furosemide  20 mg Oral Daily  . guaiFENesin  600 mg Oral BID  . ipratropium  0.5 mg Nebulization Q6H  . multivitamin with minerals  1 tablet Oral Daily  . pantoprazole  40 mg Oral Daily  . piperacillin-tazobactam (ZOSYN)  IV  3.375 g Intravenous Q8H  . simvastatin  10 mg Oral Daily  . sodium chloride  3 mL Intravenous Q12H  . tiotropium  18 mcg Inhalation Daily  . warfarin  10 mg Oral ONCE-1800  . Warfarin - Physician Dosing Inpatient   Does not apply q1800   Continuous Infusions:  PRN Meds:.acetaminophen, albuterol, fentaNYL, nitroGLYCERIN, oxyCODONE, traMADol  Assessment/Plan:  Acute exacerbation of COPD  HTN  A. Fibrillation  Perm. Pacemaker implant.  CAD  Possible pneumonia or atelectasis  Continue medical treatment.   LOS: 4 days    Orpah Cobb  MD  02/28/2013, 5:03 PM

## 2013-03-01 LAB — IRON AND TIBC: Iron: 20 ug/dL — ABNORMAL LOW (ref 42–135)

## 2013-03-01 LAB — PROTIME-INR
INR: 2.2 — ABNORMAL HIGH (ref 0.00–1.49)
Prothrombin Time: 23.7 seconds — ABNORMAL HIGH (ref 11.6–15.2)

## 2013-03-01 LAB — FERRITIN: Ferritin: 352 ng/mL — ABNORMAL HIGH (ref 22–322)

## 2013-03-01 MED ORDER — ENSURE COMPLETE PO LIQD
237.0000 mL | Freq: Two times a day (BID) | ORAL | Status: DC
  Administered 2013-03-02 – 2013-03-04 (×5): 237 mL via ORAL

## 2013-03-01 MED ORDER — WARFARIN SODIUM 5 MG PO TABS
5.0000 mg | ORAL_TABLET | Freq: Every day | ORAL | Status: DC
  Administered 2013-03-01 – 2013-03-02 (×2): 5 mg via ORAL
  Filled 2013-03-01 (×3): qty 1

## 2013-03-01 NOTE — Progress Notes (Signed)
Subjective:  Feeling better. Positive weakness. Afebrile. INR - 2.2  Objective:  Vital Signs in the last 24 hours: Temp:  [97.9 F (36.6 C)-98.1 F (36.7 C)] 98.1 F (36.7 C) (07/07 0500) Pulse Rate:  [67-73] 71 (07/07 0500) Cardiac Rhythm:  [-] Ventricular paced (07/06 2035) Resp:  [18-20] 20 (07/07 0500) BP: (114-135)/(51-60) 135/53 mmHg (07/07 0500) SpO2:  [92 %-95 %] 92 % (07/07 0500) Weight:  [72.576 kg (160 lb)] 72.576 kg (160 lb) (07/07 0500)  Physical Exam: BP Readings from Last 1 Encounters:  03/01/13 135/53     Wt Readings from Last 1 Encounters:  03/01/13 72.576 kg (160 lb)    Weight change: -1.524 kg (-3 lb 5.8 oz)  HEENT: [place]/AT, Eyes-Blue, PERL, EOMI, Conjunctiva-Pale pink, Sclera-Non-icteric Neck: No JVD, No bruit, Trachea midline. Lungs:  Minimal wheezing, Bilateral. Cardiac:  Regular rhythm, normal S1 and S2, no S3.  Abdomen:  Soft, non-tender. Extremities:  No edema present. No cyanosis. No clubbing. CNS: AxOx3, Cranial nerves grossly intact, moves all 4 extremities. Right handed. Skin: Warm and dry.   Intake/Output from previous day: 07/06 0701 - 07/07 0700 In: 780 [P.O.:600; IV Piggyback:100] Out: 500 [Urine:500]    Lab Results: BMET    Component Value Date/Time   NA 137 02/28/2013 0400   K 3.8 02/28/2013 0400   CL 102 02/28/2013 0400   CO2 31 02/28/2013 0400   GLUCOSE 132* 02/28/2013 0400   BUN 26* 02/28/2013 0400   CREATININE 1.16 02/28/2013 0400   CALCIUM 8.6 02/28/2013 0400   GFRNONAA 58* 02/28/2013 0400   GFRAA 67* 02/28/2013 0400   CBC    Component Value Date/Time   WBC 11.9* 02/28/2013 0400   RBC 3.47* 02/28/2013 0400   HGB 10.1* 02/28/2013 0400   HCT 31.1* 02/28/2013 0400   PLT 212 02/28/2013 0400   MCV 89.6 02/28/2013 0400   MCH 29.1 02/28/2013 0400   MCHC 32.5 02/28/2013 0400   RDW 14.4 02/28/2013 0400   LYMPHSABS 3.4 02/24/2013 1556   MONOABS 3.6* 02/24/2013 1556   EOSABS 0.0 02/24/2013 1556   BASOSABS 0.0 02/24/2013 1556   CARDIAC ENZYMES Lab Results   Component Value Date   CKTOTAL 87 01/08/2012   CKMB 5.9* 01/08/2012   TROPONINI <0.30 02/24/2013    Scheduled Meds: . albuterol  2.5 mg Nebulization Q6H  . furosemide  20 mg Oral Daily  . guaiFENesin  600 mg Oral BID  . ipratropium  0.5 mg Nebulization Q6H  . multivitamin with minerals  1 tablet Oral Daily  . pantoprazole  40 mg Oral Daily  . piperacillin-tazobactam (ZOSYN)  IV  3.375 g Intravenous Q8H  . simvastatin  10 mg Oral Daily  . sodium chloride  3 mL Intravenous Q12H  . tiotropium  18 mcg Inhalation Daily  . warfarin  5 mg Oral q1800  . Warfarin - Physician Dosing Inpatient   Does not apply q1800   Continuous Infusions:  PRN Meds:.acetaminophen, albuterol, fentaNYL, nitroGLYCERIN, oxyCODONE, traMADol  Assessment/Plan:  Acute exacerbation of COPD  HTN  A. Fibrillation  Perm. Pacemaker implant.  CAD  Possible pneumonia or atelectasis Anemia-Iron deficiency  Increase activity.   LOS: 5 days    Orpah Cobb  MD  03/01/2013, 11:57 AM

## 2013-03-01 NOTE — Evaluation (Signed)
Physical Therapy Evaluation Patient Details Name: [name] MRN: [id number] DOB: 1930-09-10 Today's Date: 03/01/2013 Time: 4098-1191 PT Time Calculation (min): 29 min  PT Assessment / Plan / Recommendation History of Present Illness  pt admitted with acute exacerbation of COPD worsened by PNA and rib fractures from recent admission.  Clinical Impression  Pt admitted with fever and weakness due to COPD exac. Complicated by PNA.   Pt currently with functional limitations due to the deficits listed below (see PT Problem List). Pt will benefit from skilled PT to increase their independence and safety with mobility to allow discharge to the venue listed below.       PT Assessment  Patient needs continued PT services    Follow Up Recommendations  SNF (unless makes alot of progress and wife can care for him)    Does the patient have the potential to tolerate intense rehabilitation      Barriers to Discharge        Equipment Recommendations  Rolling walker with 5" wheels;Cane (RW vs cane)    Recommendations for Other Services     Frequency Min 3X/week    Precautions / Restrictions Precautions Precautions: Fall   Pertinent Vitals/Pain       Mobility  Bed Mobility Bed Mobility: Supine to Sit;Sitting - Scoot to Edge of Bed Supine to Sit: 4: Min assist Sitting - Scoot to [place] of Bed: 4: Min guard Details for Bed Mobility Assistance: struggled to [employer] Transfers Transfers: Sit to Stand;Stand to [employer] to Stand: 4: Min guard;From bed;With upper extremity assist Stand to Sit: 4: Min guard;With armrests;To chair/3-in-1 Details for Transfer Assistance: vc's for hand placement and technique Ambulation/Gait Ambulation/Gait Assistance: 4: Min assist Ambulation Distance (Feet): 140 Feet Assistive device: 1 person hand held assist;Other (Comment) (iv pole) Ambulation/Gait Assistance Details: mildly staggered gait pattern that mildly improved with extra assist Gait Pattern:  Step-through pattern;Wide base of support (staggery) Gait velocity: slow    Exercises     PT Diagnosis: Difficulty walking;Generalized weakness  PT Problem List: Decreased strength;Decreased activity tolerance;Decreased balance;Decreased mobility;Decreased knowledge of use of DME;Cardiopulmonary status limiting activity PT Treatment Interventions: Gait training;Functional mobility training;Therapeutic activities;Balance training;Patient/family education     PT Goals(Current goals can be found in the care plan section) Acute Rehab PT Goals Patient Stated Goal: be able to get back home PT Goal Formulation: With patient Time For Goal Achievement: 03/15/13 Potential to Achieve Goals: Good  Visit Information  Last PT Received On: 03/01/13 Assistance Needed: +1 History of Present Illness: pt admitted with acute exacerbation of COPD worsened by PNA and rib fractures from recent admission.       Prior Functioning  Home Living Family/patient expects to be discharged to:: Private residence Living Arrangements: Spouse/significant other Available Help at Discharge: Family;Available 24 hours/day Type of Home: House Home Access: Stairs to enter [employer] of Steps: 3 Entrance Stairs-Rails: Right;Left Home Layout: One level Home Equipment: Cane - single point;Walker - 2 wheels Communication Communication: No difficulties Dominant Hand: Right    Cognition  Cognition Arousal/Alertness: Awake/alert Behavior During Therapy: WFL for tasks assessed/performed Overall Cognitive Status: Within Functional Limits for tasks assessed    Extremity/Trunk Assessment Upper Extremity Assessment Upper Extremity Assessment: Defer to OT evaluation Lower Extremity Assessment Lower Extremity Assessment: Generalized weakness Cervical / Trunk Assessment Cervical / Trunk Assessment: Kyphotic   Balance    End of Session PT - End of Session Equipment Utilized During Treatment: Oxygen Activity  Tolerance: Patient tolerated treatment well;Patient limited by  fatigue;Other (comment) (linited by low sats and dyspnea) Patient left: in chair;with call bell/phone within reach Nurse Communication: Mobility status  GP     Rollie Hynek, Eliseo Gum 03/01/2013, 4:40 PM 03/01/2013  [place] Bing, PT [phone]  (pager)

## 2013-03-01 NOTE — Progress Notes (Signed)
INITIAL NUTRITION ASSESSMENT  DOCUMENTATION CODES Per approved criteria  -Not Applicable   INTERVENTION: 1. Ensure Complete po BID, each supplement provides 350 kcal and 13 grams of protein.   NUTRITION DIAGNOSIS: Inadequate oral intake related to decreased appetite as evidenced by poor meal completion.   Goal: PO intake to meet >/=90% estimated nutrition needs  Monitor:  PO intake, weight trends, labs   Reason for Assessment: Malnutrition Screening Tool  79 y.o. male  Admitting Dx: Acute respiratory failure  ASSESSMENT: Pt admitted with weakness and fever at home. Has COPD, found with possible PNA on CXR on admission.  Pt states his appetite has been very poor for the past few days. Prior to that, pt was eating well.  Weight hx shows 7 lb weight loss in the past 2 months, 4% body weight, not significant in given time frame.  Pt observed with lunch tray, having eaten a few bites of salad. Agreeable to Ensure, chocolate only.   Height: Ht Readings from Last 1 Encounters:  02/25/13 6\' 1"  (1.854 m)    Weight: Wt Readings from Last 1 Encounters:  03/01/13 160 lb (72.576 kg)    Ideal Body Weight: 184 lbs  % Ideal Body Weight: 87%  Wt Readings from Last 10 Encounters:  03/01/13 160 lb (72.576 kg)  02/23/13 162 lb 3.2 oz (73.573 kg)  12/24/12 167 lb 9.6 oz (76.023 kg)  11/22/12 165 lb (74.844 kg)  06/23/12 167 lb 9.6 oz (76.023 kg)  05/08/12 163 lb 14.4 oz (74.345 kg)  03/25/12 166 lb 6.4 oz (75.479 kg)  03/02/12 166 lb 12.8 oz (75.66 kg)  01/08/12 168 lb (76.204 kg)  09/26/11 167 lb 12.8 oz (76.114 kg)    Usual Body Weight: 165-167 lbs per pt report   % Usual Body Weight: 96%  BMI:  Body mass index is 21.11 kg/(m^2). WNL   Estimated Nutritional Needs: Kcal: 1700-1900 Protein: 70-80 gm  Fluid: 1.7-1.9 L   Skin: intact   Diet Order: Cardiac  EDUCATION NEEDS: -No education needs identified at this time   Intake/Output Summary (Last 24 hours) at  03/01/13 1153 Last data filed at 03/01/13 1117  Gross per 24 hour  Intake    613 ml  Output    500 ml  Net    113 ml    Last BM: PTA    Labs:   Recent Labs Lab 02/23/13 0516  02/25/13 0430 02/26/13 0530 02/28/13 0400  NA 137  < > 137 138 137  K 4.2  < > 4.0 4.0 3.8  CL 102  < > 103 102 102  CO2 25  < > 23 27 31   BUN 25*  < > 34* 32* 26*  CREATININE 1.08  < > 1.11 1.15 1.16  CALCIUM 8.7  < > 8.5 8.8 8.6  MG 1.9  --   --  2.0  --   PHOS  --   --   --  2.8  --   GLUCOSE 155*  < > 147* 119* 132*  < > = values in this interval not displayed.  CBG (last 3)  No results found for this basename: GLUCAP,  in the last 72 hours  Scheduled Meds: . albuterol  2.5 mg Nebulization Q6H  . furosemide  20 mg Oral Daily  . guaiFENesin  600 mg Oral BID  . ipratropium  0.5 mg Nebulization Q6H  . multivitamin with minerals  1 tablet Oral Daily  . pantoprazole  40 mg Oral Daily  .  piperacillin-tazobactam (ZOSYN)  IV  3.375 g Intravenous Q8H  . simvastatin  10 mg Oral Daily  . sodium chloride  3 mL Intravenous Q12H  . tiotropium  18 mcg Inhalation Daily  . warfarin  5 mg Oral q1800  . Warfarin - Physician Dosing Inpatient   Does not apply q1800    Continuous Infusions:   Past Medical History  Diagnosis Date  . COPD (chronic obstructive pulmonary disease)   . Prostate cancer 2003  . Stroke 2000  . CAD (coronary artery disease)   . AF (atrial fibrillation)     permanent  . HTN (hypertension)   . Pneumothorax, right   . Complete heart block     S/P pacemaker insertion// St. Jude Accent RFSR  . Pacemaker   . CHF (congestive heart failure)   . Pneumonia     hx of on right   . Tobacco abuse     Past Surgical History  Procedure Laterality Date  . Coronary artery bypass graft  2003  . Prostatectomy  2003  . Appendectomy    . Eye surgery      bilateral for gaze problem  . Pacemaker insertion  11/06/10    St jude pacemaker implanted by Dr Johney Frame for complete heart block  .  Cholecystectomy      Clarene Duke RD, LDN Pager [phone] After Hours pager [phone]

## 2013-03-01 NOTE — Progress Notes (Signed)
Assisted patient back to bed x2 assist. Call bell and family near. Will continue to monitor. Mamie Levers

## 2013-03-01 NOTE — Progress Notes (Signed)
Assisted patient x2 assist to bedside chair. Call bell near.Mamie Levers

## 2013-03-01 NOTE — Progress Notes (Signed)
Assisted patient to bed. Patient has very weak cough and +rhonchi. Encouraged use of IS and flutter valve, as well as, cough/deep breathing. Denies pain at this time. Call bell near.Will monitor. Cathlean Cower, Gordy Savers

## 2013-03-02 LAB — CBC
HCT: 28.7 % — ABNORMAL LOW (ref 39.0–52.0)
Hemoglobin: 9.5 g/dL — ABNORMAL LOW (ref 13.0–17.0)
MCH: 29.1 pg (ref 26.0–34.0)
MCV: 88 fL (ref 78.0–100.0)
RBC: 3.26 MIL/uL — ABNORMAL LOW (ref 4.22–5.81)

## 2013-03-02 MED ORDER — FERROUS SULFATE 325 (65 FE) MG PO TABS
325.0000 mg | ORAL_TABLET | Freq: Two times a day (BID) | ORAL | Status: DC
  Administered 2013-03-03 – 2013-03-04 (×3): 325 mg via ORAL
  Filled 2013-03-02 (×5): qty 1

## 2013-03-02 NOTE — Progress Notes (Signed)
Patient bathed with gown and linens changed. Assisted back to bed from chair. Call bell near.[name]

## 2013-03-02 NOTE — Progress Notes (Signed)
Occupational Therapy Evaluation Patient Details Name: [name] MRN: [id number] DOB: 16-Jun-1931 Today's Date: 03/02/2013 Time: 4098-1191 OT Time Calculation (min): 26 min  OT Assessment / Plan / Recommendation History of present illness pt admitted with acute exacerbation of COPD worsened by PNA and rib fractures from recent admission.   Clinical Impression   PTA, pt was mod I with mobility and mod I with ADL.Pt has had a significant decline in functional status. Pt will benefit from skilled OT services to facilitate D/C to next venue due to below deficits.    OT Assessment  Patient needs continued OT Services    Follow Up Recommendations  SNF    Barriers to Discharge Decreased caregiver support    Equipment Recommendations  None recommended by OT    Recommendations for Other Services    Frequency  Min 2X/week    Precautions / Restrictions Precautions Precautions: Fall Restrictions Weight Bearing Restrictions: No   Pertinent Vitals/Pain Dyspnea - 3/4   ADL  Eating/Feeding: Modified independent Where Assessed - Eating/Feeding: Chair Grooming: Min guard Where Assessed - Grooming: Supported standing Upper Body Bathing: Set up;Supervision/safety Where Assessed - Upper Body Bathing: Supported sitting Lower Body Bathing: Minimal assistance Where Assessed - Lower Body Bathing: Supported sit to stand Upper Body Dressing: Minimal assistance Where Assessed - Upper Body Dressing: Supported sitting Lower Body Dressing: Moderate assistance Where Assessed - Lower Body Dressing: Supported sit to Pharmacist, hospital: Other (comment);Moderate assistance (using urinal after set up) Toilet Transfer Method: Sit to stand;Stand pivot Toilet Transfer Equipment: Bedside commode Toileting - Clothing Manipulation and Hygiene: Minimal assistance Where Assessed - Glass blower/designer Manipulation and Hygiene: Sit to stand from 3-in-1 or toilet Equipment Used: Gait belt;Rolling  walker Transfers/Ambulation Related to ADLs: Min A ADL Comments: Functional decline    OT Diagnosis: Generalized weakness  OT Problem List: Decreased strength;Decreased activity tolerance;Impaired balance (sitting and/or standing);Decreased safety awareness;Cardiopulmonary status limiting activity OT Treatment Interventions: Self-care/ADL training;Therapeutic exercise;Energy conservation;DME and/or AE instruction;Therapeutic activities;Patient/family education;Balance training   OT Goals(Current goals can be found in the care plan section) Acute Rehab OT Goals Patient Stated Goal: be able to get back home OT Goal Formulation: With patient Time For Goal Achievement: 03/16/13 Potential to Achieve Goals: Good ADL Goals Pt Will Perform Grooming: sitting;with set-up Pt Will Perform Upper Body Bathing: with set-up Pt Will Transfer to Toilet: with min guard assist;bedside commode Pt Will Perform Toileting - Clothing Manipulation and hygiene: with supervision;sit to/from stand Additional ADL Goal #1: e conservstion techies  Visit Information  Last OT Received On: 03/02/13 Assistance Needed: +1 History of Present Illness: pt admitted with acute exacerbation of COPD worsened by PNA and rib fractures from recent admission.       Prior Functioning     Home Living Family/patient expects to be discharged to:: Private residence Living Arrangements: Spouse/significant other Available Help at Discharge: Family;Available 24 hours/day Type of Home: House Home Access: Stairs to enter [employer] of Steps: 3 Entrance Stairs-Rails: Right;Left Home Layout: One level Home Equipment: Cane - single point;Walker - 2 wheels Prior Function Level of Independence: Needs assistance Communication Communication: No difficulties Dominant Hand: Right         Vision/Perception Vision - History Baseline Vision: Wears glasses all the time   Cognition  Cognition Arousal/Alertness:  Awake/alert Behavior During Therapy: WFL for tasks assessed/performed Overall Cognitive Status: Within Functional Limits for tasks assessed    Extremity/Trunk Assessment Upper Extremity Assessment Upper Extremity Assessment: Generalized weakness Lower Extremity Assessment Lower Extremity Assessment:  Generalized weakness Cervical / Trunk Assessment Cervical / Trunk Assessment: Kyphotic     Mobility Bed Mobility Bed Mobility: Not assessed Transfers Transfers: Sit to Stand;Stand to Sit Sit to Stand: 3: Mod assist;With upper extremity assist;With armrests;From chair/3-in-1 Stand to Sit: 4: Min assist;With upper extremity assist;With armrests;To chair/3-in-1 Details for Transfer Assistance: min vc for safety     Exercise Other Exercises Other Exercises: encouraged BUE AROM/strengthening   Balance Balance Balance Assessed: Yes Static Standing Balance Static Standing - Balance Support: Bilateral upper extremity supported Static Standing - Level of Assistance: 4: Min assist   End of Session OT - End of Session Equipment Utilized During Treatment: Gait belt;Rolling walker Activity Tolerance: Patient tolerated treatment well Patient left: in bed;with call bell/phone within reach Nurse Communication: Mobility status  GO     Shantae Vantol,HILLARY 03/02/2013, 5:04 PM [facility], OTR/L  [phone] 03/02/2013

## 2013-03-02 NOTE — Progress Notes (Signed)
Physical Therapy Treatment Patient Details Name: [name] MRN: [id number] DOB: September 21, 1930 Today's Date: 03/02/2013 Time: 4098-1191 PT Time Calculation (min): 16 min  PT Assessment / Plan / Recommendation  PT Comments   Pt continues to demonstrate weakness and decr activity tolerance.  Follow Up Recommendations  SNF     Does the patient have the potential to tolerate intense rehabilitation     Barriers to Discharge        Equipment Recommendations  Rolling walker with 5" wheels    Recommendations for Other Services    Frequency Min 3X/week   Progress towards PT Goals Progress towards PT goals: Progressing toward goals  Plan Current plan remains appropriate    Precautions / Restrictions Precautions Precautions: Fall   Pertinent Vitals/Pain Dyspnea 3/4 with amb with 3-4L O2.    Mobility  Transfers Sit to Stand: 3: Mod assist;With upper extremity assist;With armrests;From chair/3-in-1 Stand to Sit: 4: Min assist;With upper extremity assist;With armrests;To chair/3-in-1 Details for Transfer Assistance: Assist to bring hips up. Ambulation/Gait Ambulation/Gait Assistance: 4: Min assist Ambulation Distance (Feet): 140 Feet Assistive device: Rolling walker Ambulation/Gait Assistance Details: Verbal cues to stay closer to walker. Gait Pattern: Step-through pattern;Decreased step length - left;Decreased step length - right;Trunk flexed;Shuffle    Exercises     PT Diagnosis:    PT Problem List:   PT Treatment Interventions:     PT Goals (current goals can now be found in the care plan section)    Visit Information  Last PT Received On: 03/02/13 Assistance Needed: +1 History of Present Illness: pt admitted with acute exacerbation of COPD worsened by PNA and rib fractures from recent admission.    Subjective Data      Cognition  Cognition Arousal/Alertness: Awake/alert Behavior During Therapy: WFL for tasks assessed/performed Overall Cognitive Status: Within  Functional Limits for tasks assessed    Balance  Balance Balance Assessed: Yes Static Standing Balance Static Standing - Balance Support: Bilateral upper extremity supported Static Standing - Level of Assistance: 4: Min assist  End of Session PT - End of Session Equipment Utilized During Treatment: Oxygen Activity Tolerance: Patient limited by fatigue Patient left: in chair;with call bell/phone within reach Nurse Communication: Mobility status   GP     [facility] 03/02/2013, 12:27 PM  [facility] PT [phone]

## 2013-03-02 NOTE — Progress Notes (Signed)
ANTIBIOTIC CONSULT NOTE - Follow Up Consult  Pharmacy Consult for Zosyn Indication: AECOPD  No Known Allergies  Patient Measurements: Height: 6\' 1"  (185.4 cm) Weight: 156 lb (70.761 kg) IBW/kg (Calculated) : 79.9  Vital Signs: Temp: 99.4 F (37.4 C) (07/08 0355) Temp src: Oral (07/08 0355) BP: 131/48 mmHg (07/08 0355) Pulse Rate: 67 (07/08 0355)  Labs:  Recent Labs  02/28/13 0400 03/01/13 0538 03/02/13 0525  HGB 10.1*  --  9.5*  HCT 31.1*  --  28.7*  PLT 212  --  242  LABPROT 21.3* 23.7* 29.1*  INR 1.91* 2.20* 2.87*  CREATININE 1.16  --   --     Estimated Creatinine Clearance: 50.9 ml/min (by C-G formula based on Cr of 1.16).   Medical History: Past Medical History  Diagnosis Date  . COPD (chronic obstructive pulmonary disease)   . Prostate cancer 2003  . Stroke 2000  . CAD (coronary artery disease)   . AF (atrial fibrillation)     permanent  . HTN (hypertension)   . Pneumothorax, right   . Complete heart block     S/P pacemaker insertion// St. Jude Accent RFSR  . Pacemaker   . CHF (congestive heart failure)   . Pneumonia     hx of on right   . Tobacco abuse     Medications:  Prescriptions prior to admission  Medication Sig Dispense Refill  . albuterol (PROVENTIL HFA;VENTOLIN HFA) 108 (90 BASE) MCG/ACT inhaler Inhale 2 puffs into the lungs every 6 (six) hours as needed for wheezing.      Marland Kitchen amLODipine (NORVASC) 5 MG tablet Take 1 tablet (5 mg total) by mouth daily.  30 tablet  3  . CALCIUM-VITAMIN D PO Take 1 tablet by mouth daily.      . carvedilol (COREG) 3.125 MG tablet Take 3.125 mg by mouth 2 (two) times daily with a meal.      . furosemide (LASIX) 20 MG tablet Take 20 mg by mouth daily.      Marland Kitchen losartan (COZAAR) 50 MG tablet Take 50 mg by mouth every evening.      . Multiple Vitamin (MULITIVITAMIN WITH MINERALS) TABS Take 1 tablet by mouth daily.      . nitroGLYCERIN (NITROSTAT) 0.4 MG SL tablet Place 0.4 mg under the tongue every 5 (five)  minutes as needed. For chest pain      . oxyCODONE (OXY IR/ROXICODONE) 5 MG immediate release tablet Take 1 tablet (5 mg total) by mouth every 8 (eight) hours as needed for pain.  60 tablet  0  . pantoprazole (PROTONIX) 40 MG tablet Take 40 mg by mouth daily.      . simvastatin (ZOCOR) 10 MG tablet Take 10 mg by mouth daily.       Marland Kitchen tiotropium (SPIRIVA) 18 MCG inhalation capsule Place 1 capsule (18 mcg total) into inhaler and inhale daily.  30 capsule  12  . warfarin (COUMADIN) 7.5 MG tablet Take 3.75-7.5 mg by mouth every evening. 1/2 tablet (3.75 mg) on Sunday and Wednesday, 1 tablet (7.5 mg) on all other days       Recent Results (from the past 240 hour(s))  MRSA PCR SCREENING     Status: None   Collection Time    02/24/13  8:53 PM      Result Value Range Status   MRSA by PCR NEGATIVE  NEGATIVE Final   Comment:            The GeneXpert MRSA Assay (FDA  approved for NASAL specimens     only), is one component of a     comprehensive MRSA colonization     surveillance program. It is not     intended to diagnose MRSA     infection nor to guide or     monitor treatment for     MRSA infections.  CULTURE, BLOOD (ROUTINE X 2)     Status: None   Collection Time    02/24/13 10:40 PM      Result Value Range Status   Specimen Description BLOOD RIGHT ANTECUBITAL   Final   Special Requests BOTTLES DRAWN AEROBIC ONLY 10CC   Final   Culture  Setup Time 02/25/2013 04:02   Final   Culture     Final   Value:        BLOOD CULTURE RECEIVED NO GROWTH TO DATE CULTURE WILL BE HELD FOR 5 DAYS BEFORE ISSUING A FINAL NEGATIVE REPORT   Report Status PENDING   Incomplete  CULTURE, BLOOD (ROUTINE X 2)     Status: None   Collection Time    02/24/13 10:50 PM      Result Value Range Status   Specimen Description BLOOD RIGHT HAND   Final   Special Requests BOTTLES DRAWN AEROBIC ONLY 10CC   Final   Culture  Setup Time 02/25/2013 04:02   Final   Culture     Final   Value:        BLOOD CULTURE RECEIVED NO  GROWTH TO DATE CULTURE WILL BE HELD FOR 5 DAYS BEFORE ISSUING A FINAL NEGATIVE REPORT   Report Status PENDING   Incomplete    Assessment: 79 yo male admitted with SOB, CP and fever.  Pharmacy asked to dose vancomycin and Zosyn initially, then antibiotics narrowed to Zosyn alone.  Today is day # 6 of Zosyn.  Patient now afebrile, WBC trending down, and cultures negative.  Goal of Therapy:  Renal adjustment of antibiotics.   Plan:  1) Continue Zosyn 3.375gm IV q8h.  2) F/u restart home meds - BP stable for now  Thank you for allowing pharmacy to be a part of this patients care team.  Tad Moore, BCPS  Clinical Pharmacist Pager [phone]  03/02/2013 11:01 AM

## 2013-03-02 NOTE — Clinical Social Work Psychosocial (Addendum)
     Clinical Social Work Department BRIEF PSYCHOSOCIAL ASSESSMENT 03/02/2013  Patient:  [name], [name]     Account Number:  [id number]     Admit date:  02/24/2013  Clinical Social Worker:  Tiburcio Pea  Date/Time:  03/02/2013 05:44 PM  Referred by:  Physician  Date Referred:  03/02/2013 Referred for  SNF Placement   Other Referral:   Interview type:  Other - See comment Other interview type:   Patient and his wife    PSYCHOSOCIAL DATA Living Status:  WIFE Admitted from facility:   Level of care:   Primary support name:  [name]  (c) [phone]  (H) [phone] Primary support relationship to patient:  SPOUSE Degree of support available:   Very Strong support    CURRENT CONCERNS Current Concerns  Post-Acute Placement   Other Concerns:    SOCIAL WORK ASSESSMENT / PLAN CSW met with patient and his wife [name] today to discuss d/c disposition.  PT recommended short term SNF but patient is very concerned about about his insurance co-pays at the SNF. Wife is concerned that he needs to be a little stronger before he returns home. Discussed bed search process; patient and his wife will agree to placement at [facility]; if unable to secure a bed there- they would elect to return home.  CSW contacted [place] [facility] and referral was completed.  [facility] has given a bed offer.  Will plan d/c tomorrow if medically stable per MD.  Fl2 placed on chart for MD"s signature.   Assessment/plan status:  Psychosocial Support/Ongoing Assessment of Needs Other assessment/ plan:   Information/referral to community resources:   SNF bed list provided to patient/wife but they will only accept [facility].  If not- will go home with home health    PATIENTS/FAMILYS RESPONSE TO PLAN OF CARE: Patient is alert, oriented and very pleasant. Both patient and his wife agree to short term SNF at [facility]; they are pleased that there is a bed at [facility].  CSW will assist with d/c when  stable.  Lorri Frederick. Lynnzie Blackson, LCSWA  [phone]

## 2013-03-02 NOTE — Progress Notes (Signed)
Subjective:  Slow progress daily.  Objective:  Vital Signs in the last 24 hours: Temp:  [99.2 F (37.3 C)-99.8 F (37.7 C)] 99.2 F (37.3 C) (07/08 1332) Pulse Rate:  [66-71] 66 (07/08 1332) Cardiac Rhythm:  [-] Ventricular paced (07/08 0730) Resp:  [18-20] 20 (07/08 1332) BP: (121-152)/(48-66) 121/51 mmHg (07/08 1332) SpO2:  [89 %-96 %] 96 % (07/08 1332) Weight:  [70.761 kg (156 lb)] 70.761 kg (156 lb) (07/08 0355)  Physical Exam: BP Readings from Last 1 Encounters:  03/02/13 121/51     Wt Readings from Last 1 Encounters:  03/02/13 70.761 kg (156 lb)    Weight change: -1.814 kg (-4 lb)  HEENT: [place]/AT, Eyes-Blue, PERL, EOMI, Conjunctiva-Pale pink, Sclera-Non-icteric, wears glasses. Neck: No JVD, No bruit, Trachea midline. Lungs:  Clearing, Bilateral. Cardiac:  Regular rhythm, normal S1 and S2, no S3.  Abdomen:  Soft, non-tender. Extremities:  No edema present. No cyanosis. Positive clubbing. CNS: AxOx3, Cranial nerves grossly intact, moves all 4 extremities. Right handed. Skin: Warm and dry.   Intake/Output from previous day: 07/07 0701 - 07/08 0700 In: 363 [P.O.:360; I.V.:3] Out: 1200 [Urine:1200]    Lab Results: BMET    Component Value Date/Time   NA 137 02/28/2013 0400   K 3.8 02/28/2013 0400   CL 102 02/28/2013 0400   CO2 31 02/28/2013 0400   GLUCOSE 132* 02/28/2013 0400   BUN 26* 02/28/2013 0400   CREATININE 1.16 02/28/2013 0400   CALCIUM 8.6 02/28/2013 0400   GFRNONAA 58* 02/28/2013 0400   GFRAA 67* 02/28/2013 0400   CBC    Component Value Date/Time   WBC 11.0* 03/02/2013 0525   RBC 3.26* 03/02/2013 0525   HGB 9.5* 03/02/2013 0525   HCT 28.7* 03/02/2013 0525   PLT 242 03/02/2013 0525   MCV 88.0 03/02/2013 0525   MCH 29.1 03/02/2013 0525   MCHC 33.1 03/02/2013 0525   RDW 14.1 03/02/2013 0525   LYMPHSABS 3.4 02/24/2013 1556   MONOABS 3.6* 02/24/2013 1556   EOSABS 0.0 02/24/2013 1556   BASOSABS 0.0 02/24/2013 1556   CARDIAC ENZYMES Lab Results  Component Value Date   CKTOTAL 87  01/08/2012   CKMB 5.9* 01/08/2012   TROPONINI <0.30 02/24/2013    Scheduled Meds: . albuterol  2.5 mg Nebulization Q6H  . feeding supplement  237 mL Oral BID BM  . [START ON 03/03/2013] ferrous sulfate  325 mg Oral BID WC  . furosemide  20 mg Oral Daily  . guaiFENesin  600 mg Oral BID  . ipratropium  0.5 mg Nebulization Q6H  . multivitamin with minerals  1 tablet Oral Daily  . pantoprazole  40 mg Oral Daily  . piperacillin-tazobactam (ZOSYN)  IV  3.375 g Intravenous Q8H  . simvastatin  10 mg Oral Daily  . sodium chloride  3 mL Intravenous Q12H  . tiotropium  18 mcg Inhalation Daily  . warfarin  5 mg Oral q1800  . Warfarin - Physician Dosing Inpatient   Does not apply q1800   Continuous Infusions:  PRN Meds:.acetaminophen, albuterol, fentaNYL, nitroGLYCERIN, oxyCODONE, traMADol  Assessment/Plan: Acute exacerbation of COPD  HTN  A. Fibrillation  Perm. Pacemaker implant.  CAD  Possible pneumonia or atelectasis  Anemia-Iron deficiency  Add FeSO4 twice daily.  Stool for occult blood. Home v/s SNF.   LOS: 6 days    Orpah Cobb  MD  03/02/2013, 5:15 PM

## 2013-03-03 ENCOUNTER — Encounter: Payer: Medicare Other | Admitting: Internal Medicine

## 2013-03-03 LAB — CULTURE, BLOOD (ROUTINE X 2): Culture: NO GROWTH

## 2013-03-03 LAB — PROTIME-INR: Prothrombin Time: 32 seconds — ABNORMAL HIGH (ref 11.6–15.2)

## 2013-03-03 MED ORDER — AZITHROMYCIN 500 MG PO TABS
500.0000 mg | ORAL_TABLET | Freq: Every day | ORAL | Status: DC
  Administered 2013-03-03 – 2013-03-04 (×2): 500 mg via ORAL
  Filled 2013-03-03 (×2): qty 1

## 2013-03-03 NOTE — Progress Notes (Signed)
Occupational Therapy Treatment Patient Details Name: [name] MRN: [id number] DOB: Jan 03, 1931 Today's Date: 03/03/2013 Time: 4098-1191 OT Time Calculation (min): 16 min  OT Assessment / Plan / Recommendation  OT comments  Pt making progress. Educated pt on pursed lip breathing techniques. Dyspnea 2/4 withminimal activity. Continue to recommend D/C to SNF.  Follow Up Recommendations  SNF    Barriers to Discharge       Equipment Recommendations  None recommended by OT    Recommendations for Other Services    Frequency Min 2X/week   Progress towards OT Goals Progress towards OT goals: Progressing toward goals  Plan Discharge plan remains appropriate    Precautions / Restrictions Precautions Precautions: Fall   Pertinent Vitals/Pain no apparent distress     ADL  ADL Comments: focusofsessionon functional mobility duringADL. Educaate pt on pursed lipbreathing.     OT Diagnosis:    OT Problem List:   OT Treatment Interventions:     OT Goals(current goals can now be found in the care plan section) Acute Rehab OT Goals Patient Stated Goal: be able to get back home OT Goal Formulation: With patient Time For Goal Achievement: 03/16/13 Potential to Achieve Goals: Good ADL Goals Pt Will Perform Grooming: sitting;with set-up Pt Will Perform Upper Body Bathing: with set-up Pt Will Transfer to Toilet: with min guard assist;bedside commode Pt Will Perform Toileting - Clothing Manipulation and hygiene: with supervision;sit to/from stand Additional ADL Goal #1: e conservstion techies  Visit Information  Last OT Received On: 03/03/13 Assistance Needed: +1    Subjective Data      Prior Functioning       Cognition  Cognition Arousal/Alertness: Awake/alert Behavior During Therapy: WFL for tasks assessed/performed Overall Cognitive Status: Within Functional Limits for tasks assessed    Mobility  Transfers Transfers: Sit to Stand;Stand to Sit Sit to Stand: 4: Min  assist Stand to Sit: 4: Min assist    Exercises  Other Exercises Other Exercises: encouraged BUE AROM/strengthening   Balance Static Standing Balance Static Standing - Balance Support: Bilateral upper extremity supported Static Standing - Level of Assistance: Other (comment) (minguard)   End of Session OT - End of Session Equipment Utilized During Treatment: Gait belt;Rolling walker Activity Tolerance: Patient tolerated treatment well Patient left: with call bell/phone within reach;in chair Nurse Communication: Mobility status  GO     Roniyah Llorens,HILLARY 03/03/2013, 5:35 PM [facility], OTR/L  [phone] 03/03/2013

## 2013-03-03 NOTE — Progress Notes (Signed)
Subjective:  Weakness continues. Depression from news about sister dying soon.  Objective:  Vital Signs in the last 24 hours: Temp:  [98.6 F (37 C)-99.9 F (37.7 C)] 98.6 F (37 C) (07/09 1423) Pulse Rate:  [65-72] 72 (07/09 1423) Cardiac Rhythm:  [-] Ventricular paced (07/09 0824) Resp:  [16-20] 16 (07/09 1423) BP: (100-135)/(49-65) 100/61 mmHg (07/09 1423) SpO2:  [90 %-94 %] 91 % (07/09 1446) Weight:  [73.3 kg (161 lb 9.6 oz)] 73.3 kg (161 lb 9.6 oz) (07/09 0521)  Physical Exam: BP Readings from Last 1 Encounters:  03/03/13 100/61     Wt Readings from Last 1 Encounters:  03/03/13 73.3 kg (161 lb 9.6 oz)    Weight change: 2.539 kg (5 lb 9.6 oz)  HEENT: [place]/AT, Eyes-Blue, PERL, EOMI, Conjunctiva-Pale pink, Sclera-Non-icteric. Neck: No JVD, No bruit, Trachea midline. Lungs:  Clearing, Bilateral. Decreased air entry both bases. Cardiac:  Regular rhythm, normal S1 and S2, no S3.  Abdomen:  Soft, non-tender. Extremities:  No edema present. No cyanosis. No clubbing. CNS: AxOx3, Cranial nerves grossly intact, moves all 4 extremities. Right handed. Skin: Warm and dry.   Intake/Output from previous day: 07/08 0701 - 07/09 0700 In: 120 [P.O.:120] Out: 800 [Urine:800]    Lab Results: BMET    Component Value Date/Time   NA 137 02/28/2013 0400   K 3.8 02/28/2013 0400   CL 102 02/28/2013 0400   CO2 31 02/28/2013 0400   GLUCOSE 132* 02/28/2013 0400   BUN 26* 02/28/2013 0400   CREATININE 1.16 02/28/2013 0400   CALCIUM 8.6 02/28/2013 0400   GFRNONAA 58* 02/28/2013 0400   GFRAA 67* 02/28/2013 0400   CBC    Component Value Date/Time   WBC 11.0* 03/02/2013 0525   RBC 3.26* 03/02/2013 0525   HGB 9.5* 03/02/2013 0525   HCT 28.7* 03/02/2013 0525   PLT 242 03/02/2013 0525   MCV 88.0 03/02/2013 0525   MCH 29.1 03/02/2013 0525   MCHC 33.1 03/02/2013 0525   RDW 14.1 03/02/2013 0525   LYMPHSABS 3.4 02/24/2013 1556   MONOABS 3.6* 02/24/2013 1556   EOSABS 0.0 02/24/2013 1556   BASOSABS 0.0 02/24/2013 1556   CARDIAC  ENZYMES Lab Results  Component Value Date   CKTOTAL 87 01/08/2012   CKMB 5.9* 01/08/2012   TROPONINI <0.30 02/24/2013    Scheduled Meds: . albuterol  2.5 mg Nebulization Q6H  . feeding supplement  237 mL Oral BID BM  . ferrous sulfate  325 mg Oral BID WC  . furosemide  20 mg Oral Daily  . guaiFENesin  600 mg Oral BID  . ipratropium  0.5 mg Nebulization Q6H  . multivitamin with minerals  1 tablet Oral Daily  . pantoprazole  40 mg Oral Daily  . piperacillin-tazobactam (ZOSYN)  IV  3.375 g Intravenous Q8H  . simvastatin  10 mg Oral Daily  . sodium chloride  3 mL Intravenous Q12H  . tiotropium  18 mcg Inhalation Daily  . Warfarin - Physician Dosing Inpatient   Does not apply q1800   Continuous Infusions:  PRN Meds:.acetaminophen, albuterol, fentaNYL, nitroGLYCERIN, oxyCODONE, traMADol  Assessment/Plan: Acute exacerbation of COPD  HTN  A. Fibrillation  Perm. Pacemaker implant.  CAD  Possible pneumonia or atelectasis  Anemia-Iron deficiency  Continue medical treatment. Change to oral antibiotic.   LOS: 7 days    Orpah Cobb  MD  03/03/2013, 5:28 PM

## 2013-03-03 NOTE — Progress Notes (Signed)
RNCM advised me that patient will not be ready for d/c to SNF until tomorrow- I have updated [facility] of this and will plan tentatively for d/c tomorrow-  Reece Levy, MSW [phone]

## 2013-03-03 NOTE — Progress Notes (Signed)
Pt ambulated approx. 50 feet with rolling walker and assist x2 and 02 [place] at 3L. Pt limited by weakness. Pt back in chair with call bell in reach.

## 2013-03-04 LAB — PROTIME-INR: INR: 3.54 — ABNORMAL HIGH (ref 0.00–1.49)

## 2013-03-04 MED ORDER — FERROUS SULFATE 325 (65 FE) MG PO TABS: 325.0000 mg | ORAL_TABLET | Freq: Two times a day (BID) | ORAL | Status: AC

## 2013-03-04 MED ORDER — IPRATROPIUM BROMIDE 0.02 % IN SOLN: 0.5000 mg | Freq: Two times a day (BID) | RESPIRATORY_TRACT | Status: DC

## 2013-03-04 MED ORDER — AZITHROMYCIN 500 MG PO TABS: 500.0000 mg | ORAL_TABLET | Freq: Every day | ORAL | Status: AC

## 2013-03-04 MED ORDER — PHYTONADIONE 5 MG PO TABS
2.5000 mg | ORAL_TABLET | Freq: Once | ORAL | Status: AC
  Administered 2013-03-04: 2.5 mg via ORAL
  Filled 2013-03-04: qty 1

## 2013-03-04 MED ORDER — ACETAMINOPHEN 325 MG PO TABS: 650.0000 mg | ORAL_TABLET | ORAL | Status: AC | PRN

## 2013-03-04 MED ORDER — ALBUTEROL SULFATE (5 MG/ML) 0.5% IN NEBU: 2.5000 mg | INHALATION_SOLUTION | Freq: Two times a day (BID) | RESPIRATORY_TRACT | Status: DC

## 2013-03-04 MED ORDER — GUAIFENESIN ER 600 MG PO TB12: 600.0000 mg | ORAL_TABLET | Freq: Two times a day (BID) | ORAL | Status: AC

## 2013-03-04 NOTE — Discharge Summary (Signed)
Physician Discharge Summary  Patient ID: [name] MRN: [id number] DOB/AGE: February 26, 1931 79 y.o.  Admit date: 02/24/2013 Discharge date: 03/04/2013  Admission Diagnoses: Acute exacerbation of COPD  HTN  A. Fibrillation  Perm. Pacemaker implant.  CAD  Possible pneumonia or atelectasis  Anemia-Iron deficiency  Discharge Diagnoses:  Principal Problem: * Acute respiratory failure * Acute exacerbation of COPD   HTN  A. Fibrillation  Perm. Pacemaker implant.  CAD  Possible pneumonia or atelectasis  Anemia-Iron deficiency Epistaxis Chronic coumadin use  Discharged Condition: fair  Hospital Course: 79 year old male presented with some weakness and fever of 101 F at home. Patient had significant respiratory distress requiring BIPAP and Pulmonary consult and ICU admission. He responded well to treatment and was transferred to telemetry floor in 48 hours. He had severe COPD and has home oxygen but he became weak with 1 week stay in hospital. He had mild epistaxis treated with local pressure and small dose vitamin K.  Consults: pulmonary/intensive care  Significant Diagnostic Studies: labs: Near normal electrolytes with mildly elevated BUN/Cr, glucose and low Hgb of 9.5 to 12.1 g/dl, low serum FAOZ-30 mcg/dl.  CXR showed: 1. Emphysema. 2. Minimally prominent markings at the left lung base. Consider follow-up if warranted clinically. 3. Stable cardiomegaly with pacer.    Treatments: antibiotics: vancomycin, Zosyn and azithromycin and respiratory therapy: albuterol/atropine nebulizer  Discharge Exam: Blood pressure 126/60, pulse 73, temperature 99.5 F (37.5 C), temperature source Oral, resp. rate 18, height 6\' 1"  (1.854 m), weight 69.8 kg (153 lb 14.1 oz), SpO2 94.00%. HEENT: [place]/AT, blue eyes, Conjunctiva pink. Sclera- non-icteric, wears glasses. Dried blood around nose. NECK: Supple. No JVD.  LUNGS: Decreased breath sounds on both base. Tenderness on palpation of left lower ribs  anteriorly.  HEART: Irregularly irregular rhythm. Soft systolic murmur.  ABDOMEN: Soft. Bowel sounds present, nontender.  EXTREMITIES: No edema, cyanosis, or clubbing.  CNS:- Grossly intact. Moves all 4 extremities.  SKIN: Warm and dry  Disposition: SNF.   Future Appointments Provider Department Dept Phone   03/24/2013 9:00 AM Leslye Peer, MD [facility] [phone]   05/05/2013 2:00 PM Sherrie George, MD [facility] [phone]       03/09/2013                            Orpah Cobb, MD                [facility].     Medication List    ASK your doctor about these medications       albuterol 108 (90 BASE) MCG/ACT inhaler  Commonly known as:  PROVENTIL HFA;VENTOLIN HFA  Inhale 2 puffs into the lungs every 6 (six) hours as needed for wheezing.     amLODipine 5 MG tablet  Commonly known as:  NORVASC  Take 1 tablet (5 mg total) by mouth daily.     CALCIUM-VITAMIN D PO  Take 1 tablet by mouth daily.     carvedilol 3.125 MG tablet  Commonly known as:  COREG  Take 3.125 mg by mouth 2 (two) times daily with a meal.     furosemide 20 MG tablet  Commonly known as:  LASIX  Take 20 mg by mouth daily.     losartan 50 MG tablet  Commonly known as:  COZAAR  Take 50 mg by mouth every evening.     multivitamin with minerals Tabs  Take  1 tablet by mouth daily.     nitroGLYCERIN 0.4 MG SL tablet  Commonly known as:  NITROSTAT  Place 0.4 mg under the tongue every 5 (five) minutes as needed. For chest pain     oxyCODONE 5 MG immediate release tablet  Commonly known as:  Oxy IR/ROXICODONE  Take 1 tablet (5 mg total) by mouth every 8 (eight) hours as needed for pain.     pantoprazole 40 MG tablet  Commonly known as:  PROTONIX  Take 40 mg by mouth daily.     simvastatin 10 MG tablet  Commonly known as:  ZOCOR  Take 10 mg by mouth daily.     tiotropium 18 MCG inhalation capsule  Commonly known as:  SPIRIVA  Place 1 capsule (18 mcg  total) into inhaler and inhale daily.         SignedOrpah Cobb S 03/04/2013, 8:49 AM

## 2013-03-04 NOTE — Progress Notes (Signed)
Patient for d/c today to SNF bed at [facility]. Family and patient agreeable to this plan- plan transfer via EMS. Reece Levy, MSW [phone]

## 2013-03-04 NOTE — Progress Notes (Signed)
Called report to SNF. Pt is stable and ready for discharge. Will be transported via EMS.

## 2013-03-04 NOTE — Progress Notes (Signed)
Physical Therapy Treatment Patient Details Name: [name] MRN: [id number] DOB: 1931/03/19 Today's Date: 03/04/2013 Time: 7829-5621 PT Time Calculation (min): 25 min  PT Assessment / Plan / Recommendation  PT Comments   Pt demonstrates some improvements in activity tolerance today. Increased ambulation with rest breaks and also tolerated there ex well. Will continue to progress acitivity as tolerated.  Follow Up Recommendations  SNF     Does the patient have the potential to tolerate intense rehabilitation     Barriers to Discharge        Equipment Recommendations  Rolling walker with 5" wheels    Recommendations for Other Services    Frequency Min 3X/week   Progress towards PT Goals Progress towards PT goals: Progressing toward goals  Plan Current plan remains appropriate    Precautions / Restrictions Precautions Precautions: Fall Restrictions Weight Bearing Restrictions: No   Pertinent Vitals/Pain 4/10 pain pre medicated    Mobility  Bed Mobility Bed Mobility: Not assessed Transfers Transfers: Sit to Stand;Stand to Sit Sit to Stand: 4: Min assist Stand to Sit: 4: Min assist Details for Transfer Assistance: min vc for safety Ambulation/Gait Ambulation/Gait Assistance: 4: Min guard Ambulation Distance (Feet): 200 Feet Assistive device: Rolling walker Ambulation/Gait Assistance Details: VCs for upright posture and position withing rw Gait Pattern: Step-through pattern;Decreased step length - left;Decreased step length - right;Trunk flexed;Shuffle Gait velocity: slow General Gait Details: amb on 4 liters O2 with two extended rest breaks    Exercises General Exercises - Lower Extremity Ankle Circles/Pumps: AROM;Both;10 reps Long Arc Quad: AROM;Both;10 reps Hip ABduction/ADduction: AROM;Both;10 reps Hip Flexion/Marching: AROM;Both;10 reps   PT Diagnosis:    PT Problem List:   PT Treatment Interventions:     PT Goals (current goals can now be found in the  care plan section) Acute Rehab PT Goals Patient Stated Goal: be able to get back home PT Goal Formulation: With patient Time For Goal Achievement: 03/15/13 Potential to Achieve Goals: Good  Visit Information  Last PT Received On: 03/04/13 Assistance Needed: +1 History of Present Illness: pt admitted with acute exacerbation of COPD worsened by PNA and rib fractures from recent admission.    Subjective Data  Subjective: I am ready to try Patient Stated Goal: be able to get back home   Cognition  Cognition Arousal/Alertness: Awake/alert Behavior During Therapy: WFL for tasks assessed/performed Overall Cognitive Status: Within Functional Limits for tasks assessed    Balance  Balance Balance Assessed: Yes Static Standing Balance Static Standing - Balance Support: Bilateral upper extremity supported Static Standing - Level of Assistance: Other (comment) (minguard)  End of Session PT - End of Session Equipment Utilized During Treatment: Oxygen Activity Tolerance: Patient limited by fatigue Patient left: in chair;with call bell/phone within reach Nurse Communication: Mobility status   GP     [name] 03/04/2013, 2:02 PM [name], PT DPT  [phone]

## 2013-03-10 ENCOUNTER — Encounter: Payer: Self-pay | Admitting: Internal Medicine

## 2013-03-24 ENCOUNTER — Ambulatory Visit: Payer: Medicare Other | Admitting: Emergency Medicine

## 2013-04-08 ENCOUNTER — Emergency Department (HOSPITAL_COMMUNITY): Payer: Medicare Other

## 2013-04-08 ENCOUNTER — Inpatient Hospital Stay (HOSPITAL_COMMUNITY)
Admission: EM | Admit: 2013-04-08 | Discharge: 2013-04-26 | DRG: 064 | Disposition: E | Payer: Medicare Other | Attending: Pulmonary Disease | Admitting: Pulmonary Disease

## 2013-04-08 ENCOUNTER — Encounter (HOSPITAL_COMMUNITY): Payer: Self-pay | Admitting: *Deleted

## 2013-04-08 DIAGNOSIS — J4489 Other specified chronic obstructive pulmonary disease: Secondary | ICD-10-CM | POA: Diagnosis present

## 2013-04-08 DIAGNOSIS — I6200 Nontraumatic subdural hemorrhage, unspecified: Principal | ICD-10-CM

## 2013-04-08 DIAGNOSIS — Z9089 Acquired absence of other organs: Secondary | ICD-10-CM

## 2013-04-08 DIAGNOSIS — Z8546 Personal history of malignant neoplasm of prostate: Secondary | ICD-10-CM

## 2013-04-08 DIAGNOSIS — J449 Chronic obstructive pulmonary disease, unspecified: Secondary | ICD-10-CM | POA: Diagnosis present

## 2013-04-08 DIAGNOSIS — I2510 Atherosclerotic heart disease of native coronary artery without angina pectoris: Secondary | ICD-10-CM | POA: Diagnosis present

## 2013-04-08 DIAGNOSIS — I442 Atrioventricular block, complete: Secondary | ICD-10-CM | POA: Diagnosis present

## 2013-04-08 DIAGNOSIS — R4020 Unspecified coma: Secondary | ICD-10-CM | POA: Diagnosis present

## 2013-04-08 DIAGNOSIS — S065XAA Traumatic subdural hemorrhage with loss of consciousness status unknown, initial encounter: Secondary | ICD-10-CM

## 2013-04-08 DIAGNOSIS — F17200 Nicotine dependence, unspecified, uncomplicated: Secondary | ICD-10-CM | POA: Diagnosis present

## 2013-04-08 DIAGNOSIS — I10 Essential (primary) hypertension: Secondary | ICD-10-CM | POA: Diagnosis present

## 2013-04-08 DIAGNOSIS — S065X9A Traumatic subdural hemorrhage with loss of consciousness of unspecified duration, initial encounter: Secondary | ICD-10-CM | POA: Diagnosis present

## 2013-04-08 DIAGNOSIS — I4891 Unspecified atrial fibrillation: Secondary | ICD-10-CM | POA: Diagnosis present

## 2013-04-08 DIAGNOSIS — R569 Unspecified convulsions: Secondary | ICD-10-CM | POA: Diagnosis present

## 2013-04-08 DIAGNOSIS — I509 Heart failure, unspecified: Secondary | ICD-10-CM | POA: Diagnosis present

## 2013-04-08 DIAGNOSIS — Z8249 Family history of ischemic heart disease and other diseases of the circulatory system: Secondary | ICD-10-CM

## 2013-04-08 DIAGNOSIS — Z951 Presence of aortocoronary bypass graft: Secondary | ICD-10-CM

## 2013-04-08 DIAGNOSIS — J9600 Acute respiratory failure, unspecified whether with hypoxia or hypercapnia: Secondary | ICD-10-CM

## 2013-04-08 DIAGNOSIS — G936 Cerebral edema: Secondary | ICD-10-CM | POA: Diagnosis present

## 2013-04-08 DIAGNOSIS — Z9079 Acquired absence of other genital organ(s): Secondary | ICD-10-CM

## 2013-04-08 DIAGNOSIS — Z841 Family history of disorders of kidney and ureter: Secondary | ICD-10-CM

## 2013-04-08 DIAGNOSIS — Z515 Encounter for palliative care: Secondary | ICD-10-CM

## 2013-04-08 DIAGNOSIS — Z950 Presence of cardiac pacemaker: Secondary | ICD-10-CM

## 2013-04-08 DIAGNOSIS — J9690 Respiratory failure, unspecified, unspecified whether with hypoxia or hypercapnia: Secondary | ICD-10-CM

## 2013-04-08 DIAGNOSIS — G935 Compression of brain: Secondary | ICD-10-CM

## 2013-04-08 LAB — POCT I-STAT 3, ART BLOOD GAS (G3+)
Acid-Base Excess: 2 mmol/L (ref 0.0–2.0)
Bicarbonate: 29.4 mEq/L — ABNORMAL HIGH (ref 20.0–24.0)
Patient temperature: 98.6
pH, Arterial: 7.308 — ABNORMAL LOW (ref 7.350–7.450)
pO2, Arterial: 445 mmHg — ABNORMAL HIGH (ref 80.0–100.0)

## 2013-04-08 LAB — POCT I-STAT TROPONIN I: Troponin i, poc: 0.02 ng/mL (ref 0.00–0.08)

## 2013-04-08 LAB — CBC WITH DIFFERENTIAL/PLATELET
Basophils Absolute: 0 10*3/uL (ref 0.0–0.1)
HCT: 33 % — ABNORMAL LOW (ref 39.0–52.0)
Lymphocytes Relative: 32 % (ref 12–46)
Monocytes Absolute: 1.6 10*3/uL — ABNORMAL HIGH (ref 0.1–1.0)
Neutro Abs: 8.9 10*3/uL — ABNORMAL HIGH (ref 1.7–7.7)
RDW: 14.2 % (ref 11.5–15.5)
WBC: 15.8 10*3/uL — ABNORMAL HIGH (ref 4.0–10.5)

## 2013-04-08 LAB — BASIC METABOLIC PANEL
CO2: 24 mEq/L (ref 19–32)
Chloride: 101 mEq/L (ref 96–112)
Creatinine, Ser: 0.88 mg/dL (ref 0.50–1.35)
Sodium: 136 mEq/L (ref 135–145)

## 2013-04-08 LAB — PROTIME-INR
INR: 3.84 — ABNORMAL HIGH (ref 0.00–1.49)
Prothrombin Time: 36.3 seconds — ABNORMAL HIGH (ref 11.6–15.2)

## 2013-04-08 MED ORDER — LORAZEPAM 2 MG/ML IJ SOLN
5.0000 mg | Freq: Once | INTRAMUSCULAR | Status: AC
  Administered 2013-04-09: 5 mg via INTRAVENOUS
  Filled 2013-04-08: qty 3

## 2013-04-08 MED ORDER — SODIUM CHLORIDE 0.9 % IV SOLN
5.0000 mg/h | INTRAVENOUS | Status: DC
  Administered 2013-04-09: 5 mg/h via INTRAVENOUS
  Filled 2013-04-08: qty 10

## 2013-04-08 MED ORDER — MANNITOL 25 % IV SOLN: 0.2500 g/kg | Freq: Once | INTRAVENOUS | Status: DC

## 2013-04-08 MED ORDER — MORPHINE SULFATE 4 MG/ML IJ SOLN
4.0000 mg | INTRAMUSCULAR | Status: DC | PRN
  Administered 2013-04-09: 4 mg via INTRAVENOUS
  Filled 2013-04-08: qty 2

## 2013-04-08 MED ORDER — LORAZEPAM 2 MG/ML IJ SOLN
1.0000 mg | INTRAMUSCULAR | Status: DC | PRN
  Administered 2013-04-09: 1 mg via INTRAVENOUS

## 2013-04-08 MED ORDER — PROTHROMBIN COMPLEX CONC HUMAN 500 UNITS IV KIT
1632.0000 [IU] | PACK | Status: AC
  Administered 2013-04-08: 1632 [IU] via INTRAVENOUS
  Filled 2013-04-08: qty 65

## 2013-04-08 MED ORDER — VITAMIN K1 10 MG/ML IJ SOLN
10.0000 mg | INTRAVENOUS | Status: AC
  Administered 2013-04-08: 10 mg via INTRAVENOUS
  Filled 2013-04-08: qty 1

## 2013-04-08 MED ORDER — PROTHROMBIN COMPLEX CONC HUMAN 500 UNITS IV KIT
1500.0000 [IU] | PACK | Status: DC
  Filled 2013-04-08: qty 60

## 2013-04-08 MED ORDER — PROTHROMBIN COMPLEX CONC HUMAN 500 UNITS IV KIT: 25.0000 [IU]/kg | PACK | Status: DC

## 2013-04-08 NOTE — Progress Notes (Signed)
Chaplain Note: Responded to page requesting support for intubated pt.  Advised Diplomatic Services operational officer I was  at a death and would check in later. Secretary paged again later stating no need for chaplain since pt's pastor had arrived and family was okay.  Follow up recommended.  R[place]ford Nail Chaplain [phone]

## 2013-04-08 NOTE — ED Provider Notes (Signed)
CSN: [id number]     Arrival date & time 05/04/2013  2117 History     First MD Initiated Contact with Patient 2013-05-04 2135     Chief Complaint  Patient presents with  . Unresponsive    (Consider location/radiation/quality/duration/timing/severity/associated sxs/prior Treatment) Patient is a 79 y.o. male presenting with altered mental status and general illness. The history is provided by the EMS personnel and the spouse.  Altered Mental Status Presenting symptoms: unresponsiveness   Severity:  Severe Most recent episode:  Today Episode history:  Single Timing:  Constant Progression:  Unchanged Chronicity:  New Illness Severity:  Severe Onset quality:  Sudden Timing:  Constant Progression:  Unchanged Chronicity:  New   Past Medical History  Diagnosis Date  . COPD (chronic obstructive pulmonary disease)   . Prostate cancer 2003  . Stroke 2000  . CAD (coronary artery disease)   . AF (atrial fibrillation)     permanent  . HTN (hypertension)   . Pneumothorax, right   . Complete heart block     S/P pacemaker insertion// [employer] Accent RFSR  . Pacemaker   . CHF (congestive heart failure)   . Pneumonia     hx of on right   . Tobacco abuse    Past Surgical History  Procedure Laterality Date  . Coronary artery bypass graft  2003  . Prostatectomy  2003  . Appendectomy    . Eye surgery      bilateral for gaze problem  . Pacemaker insertion  11/06/10    [employer] jude pacemaker implanted by Dr Johney Frame for complete heart block  . Cholecystectomy     Family History  Problem Relation Age of Onset  . Heart attack Father     died age 37  . Heart disease Mother   . Heart disease Sister   . Heart disease Brother   . Multiple sclerosis Mother     died age 1  . Kidney failure Brother    History  Substance Use Topics  . Smoking status: Current Every Day Smoker -- 0.25 packs/day for 60 years    Types: Cigarettes  . Smokeless tobacco: Never Used     Comment: pt down to 3-4  ciagrettes daily  . Alcohol Use: No    Review of Systems  Unable to perform ROS: Mental status change    Allergies  Review of patient's allergies indicates no known allergies.  Home Medications   Current Outpatient Rx  Name  Route  Sig  Dispense  Refill  . acetaminophen (TYLENOL) 325 MG tablet   Oral   Take 2 tablets (650 mg total) by mouth every 4 (four) hours as needed.         Marland Kitchen albuterol (PROVENTIL HFA;VENTOLIN HFA) 108 (90 BASE) MCG/ACT inhaler   Inhalation   Inhale 2 puffs into the lungs every 6 (six) hours as needed for wheezing.         Marland Kitchen amLODipine (NORVASC) 5 MG tablet   Oral   Take 1 tablet (5 mg total) by mouth daily.   30 tablet   3   . azithromycin (ZITHROMAX) 500 MG tablet   Oral   Take 1 tablet (500 mg total) by mouth daily.   3 tablet   0   . CALCIUM-VITAMIN D PO   Oral   Take 1 tablet by mouth daily.         . carvedilol (COREG) 3.125 MG tablet   Oral   Take 3.125 mg by mouth 2 (  two) times daily with a meal.         . ferrous sulfate 325 (65 FE) MG tablet   Oral   Take 1 tablet (325 mg total) by mouth 2 (two) times daily with a meal.   60 tablet   3   . furosemide (LASIX) 20 MG tablet   Oral   Take 20 mg by mouth daily.         Marland Kitchen guaiFENesin (MUCINEX) 600 MG 12 hr tablet   Oral   Take 1 tablet (600 mg total) by mouth 2 (two) times daily.   30 tablet   1   . Multiple Vitamin (MULITIVITAMIN WITH MINERALS) TABS   Oral   Take 1 tablet by mouth daily.         . nitroGLYCERIN (NITROSTAT) 0.4 MG SL tablet   Sublingual   Place 0.4 mg under the tongue every 5 (five) minutes as needed. For chest pain         . pantoprazole (PROTONIX) 40 MG tablet   Oral   Take 40 mg by mouth daily.         . simvastatin (ZOCOR) 10 MG tablet   Oral   Take 10 mg by mouth daily.          Marland Kitchen tiotropium (SPIRIVA) 18 MCG inhalation capsule   Inhalation   Place 1 capsule (18 mcg total) into inhaler and inhale daily.   30 capsule   12     BP 141/71  Pulse 59  Resp 20  Wt 153 lb (69.4 kg)  BMI 20.19 kg/m2  SpO2 99% Physical Exam  Nursing note and vitals reviewed. Constitutional: He appears well-developed and well-nourished. He appears distressed.  HENT:  Head: Normocephalic and atraumatic.  Right Ear: External ear normal.  Left Ear: External ear normal.  Mouth/Throat: No oropharyngeal exudate.  Eyes: Conjunctivae are normal. Right eye exhibits no discharge. Left eye exhibits no discharge.  Neck: No JVD present. No tracheal deviation present. No thyromegaly present.  Patient with limited neck mobility   Cardiovascular: Normal rate, regular rhythm, normal heart sounds and intact distal pulses.  Exam reveals no gallop and no friction rub.   No murmur heard. Pulmonary/Chest: He is in respiratory distress. He has no wheezes.  Patient with shallow breathing   Abdominal: Soft. He exhibits no distension.  Musculoskeletal: He exhibits no edema.  Lymphadenopathy:    He has no cervical adenopathy.  Neurological: GCS eye subscore is 1. GCS verbal subscore is 1. GCS motor subscore is 2.  Patient with dilated pupils not responsive to light. Patient with minimal gag reflex  Skin: Skin is warm and dry. No rash noted. He is not diaphoretic. No pallor.    ED Course   INTUBATION Date/Time: 2013-04-28 9:45 PM Performed by: Sherryl Manges Authorized by: Toy Cookey, E Consent: The procedure was performed in an emergent situation. Indications: respiratory failure and airway protection Intubation method: fiberoptic oral Patient status: paralyzed (RSI) Preoxygenation: BVM Pretreatment medications: none Sedatives: etomidate Paralytic: rocuronium Laryngoscope size: Mac 3 Tube size: 7.5 mm Tube type: cuffed Number of attempts: 1 Cords visualized: yes Post-procedure assessment: chest rise and CO2 detector Breath sounds: equal and absent over the epigastrium Cuff inflated: yes ETT to lip: 24 cm ETT to teeth: 23  cm Tube secured with: ETT holder Chest x-ray interpreted by me and radiologist. Chest x-ray findings: endotracheal tube in appropriate position Patient tolerance: Patient tolerated the procedure well with no immediate complications.   (including critical  care time)  Labs Reviewed  CBC WITH DIFFERENTIAL - Abnormal; Notable for the following:    WBC 15.8 (*)    RBC 3.68 (*)    Hemoglobin 10.3 (*)    HCT 33.0 (*)    Neutro Abs 8.9 (*)    Lymphs Abs 5.1 (*)    Monocytes Absolute 1.6 (*)    All other components within normal limits  BASIC METABOLIC PANEL - Abnormal; Notable for the following:    Glucose, Bld 166 (*)    GFR calc non Af Amer 79 (*)    All other components within normal limits  APTT - Abnormal; Notable for the following:    aPTT 41 (*)    All other components within normal limits  PROTIME-INR - Abnormal; Notable for the following:    Prothrombin Time 36.3 (*)    INR 3.84 (*)    All other components within normal limits  POCT I-STAT 3, BLOOD GAS (G3+) - Abnormal; Notable for the following:    pH, Arterial 7.308 (*)    pCO2 arterial 58.8 (*)    pO2, Arterial 445.0 (*)    Bicarbonate 29.4 (*)    All other components within normal limits  BLOOD GAS, ARTERIAL  PROTIME-INR  PROTIME-INR  CG4 I-STAT (LACTIC ACID)  POCT I-STAT TROPONIN I   Ct Head Wo Contrast  04/14/2013   *RADIOLOGY REPORT*  Clinical Data:  Syncope with unresponsiveness.  Seizure, posturing.  CT HEAD WITHOUT CONTRAST CT CERVICAL SPINE WITHOUT CONTRAST  Technique:  Multidetector CT imaging of the head and cervical spine was performed following the standard protocol without intravenous contrast.  Multiplanar CT image reconstructions of the cervical spine were also generated.  Comparison:  05/05/2012.  CT HEAD  Findings:  Calvarium:No acute fracture appreciated.  Orbits: Bilateral cataract resection.  Brain: Large mixed density left subdural hematoma, measuring 31 mm in maximal thickness.  There is a thin  crescent which extends along the falx and tentorium. There is severe rightward midline shift, 2.5 cm of the septum pellucidum, with subfalcine, uncal, and downward transtentorial herniation.  The right lateral ventricle is entrapped, and ballooned.  No definite ischemic changes in the ACA territory currently.  Remote left occipital lobe infarct.  Changes of chronic small vessel ischemia.  Critical Value/emergent results were called by telephone at the time of interpretation on 04-14-13 at 22:35 to Dr Micheline Maze, who verbally acknowledged these results.  IMPRESSION: Very large, acute left subdural hematoma with 2.5 cm rightward shift, right lateral ventricular entrapment, and left uncal/downward transtentorial herniation.  CT CERVICAL SPINE  Findings: No acute fracture or traumatic subluxation identified. Mild C2-3 retrolisthesis is likely degenerative.  Diffuse advanced degenerative disc narrowing with endplate irregularities.  Congenital incomplete posterior C1 arch fusion.  No prevertebral edema.  Bullous emphysema at the apices, with the masslike scarring at the right apex, grossly similar to the chest CT from 01/13/2011.  IMPRESSION: No evidence for acute cervical spine injury   Original Report Authenticated By: Tiburcio Pea   Ct Cervical Spine Wo Contrast  2013/04/14   *RADIOLOGY REPORT*  Clinical Data:  Syncope with unresponsiveness.  Seizure, posturing.  CT HEAD WITHOUT CONTRAST CT CERVICAL SPINE WITHOUT CONTRAST  Technique:  Multidetector CT imaging of the head and cervical spine was performed following the standard protocol without intravenous contrast.  Multiplanar CT image reconstructions of the cervical spine were also generated.  Comparison:  05/05/2012.  CT HEAD  Findings:  Calvarium:No acute fracture appreciated.  Orbits: Bilateral cataract resection.  Brain:  Large mixed density left subdural hematoma, measuring 31 mm in maximal thickness.  There is a thin crescent which extends along the falx  and tentorium. There is severe rightward midline shift, 2.5 cm of the septum pellucidum, with subfalcine, uncal, and downward transtentorial herniation.  The right lateral ventricle is entrapped, and ballooned.  No definite ischemic changes in the ACA territory currently.  Remote left occipital lobe infarct.  Changes of chronic small vessel ischemia.  Critical Value/emergent results were called by telephone at the time of interpretation on 2013/04/22 at 22:35 to Dr Micheline Maze, who verbally acknowledged these results.  IMPRESSION: Very large, acute left subdural hematoma with 2.5 cm rightward shift, right lateral ventricular entrapment, and left uncal/downward transtentorial herniation.  CT CERVICAL SPINE  Findings: No acute fracture or traumatic subluxation identified. Mild C2-3 retrolisthesis is likely degenerative.  Diffuse advanced degenerative disc narrowing with endplate irregularities.  Congenital incomplete posterior C1 arch fusion.  No prevertebral edema.  Bullous emphysema at the apices, with the masslike scarring at the right apex, grossly similar to the chest CT from 01/13/2011.  IMPRESSION: No evidence for acute cervical spine injury   Original Report Authenticated By: Tiburcio Pea   Dg Chest Portable 1 View  04/22/13   *RADIOLOGY REPORT*  Clinical Data: Intubation.  Endotracheal tube placement.  PORTABLE CHEST - 1 VIEW  Comparison: 02/26/2013  Findings: Endotracheal tube terminates 2.9 cm above carina.  Pacer with lead right ventricle.  Underlying severe COPD.  Mild cardiomegaly.  Costophrenic angles excluded.  Left greater than right apical pleural thickening. Patchy left base air space disease.  Otherwise slightly improved aeration.  Possible mild interstitial edema, improved.  IMPRESSION: Endotracheal tube appropriately positioned.  Left base air space disease is suspicious for infection or aspiration.  Severe underlying COPD.  Possible superimposed interstitial edema.   Original Report  Authenticated By: Jeronimo Greaves, M.D.   1. SDH (subdural hematoma)   2. Subdural hemorrhage   3. Respiratory failure   4. Warfarin-induced coagulopathy, initial encounter     MDM  79 yr old M pt presents to the ED and is comatose. Patient reportedly had a HA today and went to sit down around 9 pm. Family thought patient was sleeping on the couch but then found that they could not wake the patient up. Patient unresponsive for EMS. Patient maintaining good oxygen saturations but his breaths are shallow and is GCS is depressed and he did not seem like he was maintaining his airway. The patient was then intubated but never required sedation as he did not move any more than an occasional cough. Patient was found to have a very large SDH that neurosurgery finds to likely be unsurviveable. Patient wife decided that the patient would not want any chest compressions or cardiac medications. Patient was given reversal medications for his coagulopathy from his coumadin use.  Case discussed with Dr. Catarina Hartshorn, MD 2013/04/22 [phone]

## 2013-04-08 NOTE — ED Notes (Signed)
CCM at bedside to evaluate pt

## 2013-04-08 NOTE — ED Notes (Signed)
Pt in via EMS after syncopal episode that was witnessed by wife, pt was unresponsive upon EMS arrival and apneic, pt with pupils noted at 4mm and unresponsive, pt with episode of seizure like activity with posturing noted after that, pt with posturing upon arrival to ED, some respiratory effort, no purposeful response to pain, pt GCS 4 upon arrival to ED, preparing for intubation, EDP at bedside

## 2013-04-08 NOTE — ED Provider Notes (Signed)
Medical screening examination/treatment/procedure(s) were performed by non-physician practitioner and as supervising physician I was immediately available for consultation/collaboration.  Pt is a 79 y.o. male with Pmhx as above, on coumadin for afib who presents with unresponsiveness proceeded by h/a at home today.  Pt found apneic, GCS 3 upon EMS arrival, possible seizure-like activity noted.  Pt arrives with agonal respirations, extensor posturing.  Pt intubated upon arrival for airway protection.  No signs of external trauma on exam.  Pt taken to CT where he was found to have a large mixed density L SHD with severe midline shift, transtentorial herniation, R lateral ventricle entrapment. At this point, INR also returned and was 3.84.  Life threatening bleeding anticoagulant reversal protocol begun w/ [facility].  Consults made to Neurology, neurosurgery, and critical care.  [facility] did not recommend mannitol, believed bleed to be nonsurvivable, surgery futile.  After discussion lead by Dr. Lew Dawes and Dr. Newell Coral with [facility], and critical care family decided to made pt DNR and pursue palliative measures in hospital.  Pt extubated and transferred to inpt med-surg bed.    1. SDH (subdural hematoma)   2. Subdural hemorrhage   3. Respiratory failure   4. Warfarin-induced coagulopathy, initial encounter   5. Acute respiratory failure   6. Brain herniation       CRITICAL CARE Performed by: Toy Cookey, E Total critical care time: 40 Critical care time was exclusive of separately billable procedures and treating other patients. Critical care was necessary to treat or prevent imminent or life-threatening deterioration. Critical care was time spent personally by me on the following activities: development of treatment plan with patient and/or surrogate as well as nursing, discussions with consultants, evaluation of patient's response to treatment, examination of patient, obtaining history from patient or  surrogate, ordering and performing treatments and interventions, ordering and review of laboratory studies, ordering and review of radiographic studies, pulse oximetry and re-evaluation of patient's condition.   Shanna Cisco, MD 04/21/2013 1020

## 2013-04-08 NOTE — Consult Note (Signed)
Reason for Consult:  Left hemispheric acute subdural hematoma, with marked mass effect, midline shift, and herniation Referring Physician:  Dr. Linda Hedges (3rd year EDP resident)  [name] is an 79 y.o. male.  HPI: Patient is an 79 year old right-handed white male who was found unresponsive at home earlier this evening. His wife explained that earlier this evening at dinner he mentioned that he had some headache, she then found him at around 2100 unresponsive. He was brought by EMS to the [facility] emergency room. He was evaluated by Dr. Lew Dawes, found to be unresponsive. Dr. Lew Dawes intubated the patient (with premedication with etomidate and rocuronium) at about 2120. The patient does not require any further sedation. CT scan of the brain was obtained and revealed a massive left hemispheric acute subdural hematoma, with marked mass effect, midline shift, and herniation. Patient's been given vitamin K and Kcentra/Feiba by the emergency room staff for his anticoagulation (INR 3.84). Neurosurgical consultation was requested for further recommendations. Patient is currently comatose, and unable to provide any additional history.  Past Medical History:  Past Medical History  Diagnosis Date  . COPD (chronic obstructive pulmonary disease)   . Prostate cancer 2003  . Stroke 2000  . CAD (coronary artery disease)   . AF (atrial fibrillation)     permanent  . HTN (hypertension)   . Pneumothorax, right   . Complete heart block     S/P pacemaker insertion// St. Jude Accent RFSR  . Pacemaker   . CHF (congestive heart failure)   . Pneumonia     hx of on right   . Tobacco abuse     Past Surgical History:  Past Surgical History  Procedure Laterality Date  . Coronary artery bypass graft  2003  . Prostatectomy  2003  . Appendectomy    . Eye surgery      bilateral for gaze problem  . Pacemaker insertion  11/06/10    St jude pacemaker implanted by Dr Johney Frame for complete heart  block  . Cholecystectomy      Family History:  Family History  Problem Relation Age of Onset  . Heart attack Father     died age 74  . Heart disease Mother   . Heart disease Sister   . Heart disease Brother   . Multiple sclerosis Mother     died age 36  . Kidney failure Brother     Social History:  reports that he has been smoking Cigarettes.  He has a 15 pack-year smoking history. He has never used smokeless tobacco. He reports that he does not drink alcohol or use illicit drugs.  Allergies: No Known Allergies  Medications: I have reviewed the patient's current medications.  Review of systems: Patient is comatose and unable to provide any review of systems.  Physical Examination: Elderly white male, intubated, on a ventilator. Blood pressure 141/71, pulse 59, resp. rate 20, weight 69.4 kg (153 lb), SpO2 99.00%. Lungs:  Clear to auscultation. Respiratory excursion is symmetrical. Sternotomy incision, and pacemaker in place. Heart:  Irregular rhythm, 2/6 murmur Abdomen:  Scaphoid, bowel sounds diminished.  Neurological Examination: Unresponsive to voice or pain. None opening eyes to voice or pain. No attempts at speech. Not following commands. Pupils 5 mm bilaterally, nonreactive to light. No corneal reflex. Weak gag and weak cough. No movement to deep central pain.   Results for orders placed during the hospital encounter of 04/13/2013 (from the past 48 hour(s))  CBC WITH DIFFERENTIAL  Status: Abnormal   Collection Time    2013/04/17  9:30 PM      Result Value Range   WBC 15.8 (*) 4.0 - 10.5 K/uL   RBC 3.68 (*) 4.22 - 5.81 MIL/uL   Hemoglobin 10.3 (*) 13.0 - 17.0 g/dL   HCT 16.1 (*) 09.6 - 04.5 %   MCV 89.7  78.0 - 100.0 fL   MCH 28.0  26.0 - 34.0 pg   MCHC 31.2  30.0 - 36.0 g/dL   RDW 40.9  81.1 - 91.4 %   Platelets 327  150 - 400 K/uL   Neutrophils Relative % 57  43 - 77 %   Neutro Abs 8.9 (*) 1.7 - 7.7 K/uL   Lymphocytes Relative 32  12 - 46 %   Lymphs Abs  5.1 (*) 0.7 - 4.0 K/uL   Monocytes Relative 10  3 - 12 %   Monocytes Absolute 1.6 (*) 0.1 - 1.0 K/uL   Eosinophils Relative 1  0 - 5 %   Eosinophils Absolute 0.2  0.0 - 0.7 K/uL   Basophils Relative 0  0 - 1 %   Basophils Absolute 0.0  0.0 - 0.1 K/uL  BASIC METABOLIC PANEL     Status: Abnormal   Collection Time    17-Apr-2013  9:30 PM      Result Value Range   Sodium 136  135 - 145 mEq/L   Potassium 4.3  3.5 - 5.1 mEq/L   Chloride 101  96 - 112 mEq/L   CO2 24  19 - 32 mEq/L   Glucose, Bld 166 (*) 70 - 99 mg/dL   BUN 20  6 - 23 mg/dL   Creatinine, Ser 7.82  0.50 - 1.35 mg/dL   Calcium 8.7  8.4 - 95.6 mg/dL   GFR calc non Af Amer 79 (*) >90 mL/min   GFR calc Af Amer >90  >90 mL/min   Comment: (NOTE)     The eGFR has been calculated using the CKD EPI equation.     This calculation has not been validated in all clinical situations.     eGFR's persistently <90 mL/min signify possible Chronic Kidney     Disease.  APTT     Status: Abnormal   Collection Time    04-17-13  9:30 PM      Result Value Range   aPTT 41 (*) 24 - 37 seconds   Comment:            IF BASELINE aPTT IS ELEVATED,     SUGGEST PATIENT RISK ASSESSMENT     BE USED TO DETERMINE APPROPRIATE     ANTICOAGULANT THERAPY.  PROTIME-INR     Status: Abnormal   Collection Time    2013-04-17  9:30 PM      Result Value Range   Prothrombin Time 36.3 (*) 11.6 - 15.2 seconds   INR 3.84 (*) 0.00 - 1.49  POCT I-STAT TROPONIN I     Status: None   Collection Time    04-17-13  9:39 PM      Result Value Range   Troponin i, poc 0.02  0.00 - 0.08 ng/mL   Comment 3            Comment: Due to the release kinetics of cTnI,     a negative result within the first hours     of the onset of symptoms does not rule out     myocardial infarction with certainty.  If myocardial infarction is still suspected,     repeat the test at appropriate intervals.  CG4 I-STAT (LACTIC ACID)     Status: None   Collection Time    04/23/2013  9:42 PM       Result Value Range   Lactic Acid, Venous 1.27  0.5 - 2.2 mmol/L  POCT I-STAT 3, BLOOD GAS (G3+)     Status: Abnormal   Collection Time    04/19/2013  9:43 PM      Result Value Range   pH, Arterial 7.308 (*) 7.350 - 7.450   pCO2 arterial 58.8 (*) 35.0 - 45.0 mmHg   pO2, Arterial 445.0 (*) 80.0 - 100.0 mmHg   Bicarbonate 29.4 (*) 20.0 - 24.0 mEq/L   TCO2 31  0 - 100 mmol/L   O2 Saturation 100.0     Acid-Base Excess 2.0  0.0 - 2.0 mmol/L   Patient temperature 98.6 F     Collection site RADIAL, ALLEN'S TEST ACCEPTABLE     Drawn by RT     Sample type ARTERIAL     Comment NOTIFIED PHYSICIAN      Ct Head Wo Contrast  04/05/2013   *RADIOLOGY REPORT*  Clinical Data:  Syncope with unresponsiveness.  Seizure, posturing.  CT HEAD WITHOUT CONTRAST CT CERVICAL SPINE WITHOUT CONTRAST  Technique:  Multidetector CT imaging of the head and cervical spine was performed following the standard protocol without intravenous contrast.  Multiplanar CT image reconstructions of the cervical spine were also generated.  Comparison:  05/05/2012.  CT HEAD  Findings:  Calvarium:No acute fracture appreciated.  Orbits: Bilateral cataract resection.  Brain: Large mixed density left subdural hematoma, measuring 31 mm in maximal thickness.  There is a thin crescent which extends along the falx and tentorium. There is severe rightward midline shift, 2.5 cm of the septum pellucidum, with subfalcine, uncal, and downward transtentorial herniation.  The right lateral ventricle is entrapped, and ballooned.  No definite ischemic changes in the ACA territory currently.  Remote left occipital lobe infarct.  Changes of chronic small vessel ischemia.  Critical Value/emergent results were called by telephone at the time of interpretation on 04/18/2013 at 22:35 to Dr Micheline Maze, who verbally acknowledged these results.  IMPRESSION: Very large, acute left subdural hematoma with 2.5 cm rightward shift, right lateral ventricular entrapment, and left  uncal/downward transtentorial herniation.  CT CERVICAL SPINE  Findings: No acute fracture or traumatic subluxation identified. Mild C2-3 retrolisthesis is likely degenerative.  Diffuse advanced degenerative disc narrowing with endplate irregularities.  Congenital incomplete posterior C1 arch fusion.  No prevertebral edema.  Bullous emphysema at the apices, with the masslike scarring at the right apex, grossly similar to the chest CT from 01/13/2011.  IMPRESSION: No evidence for acute cervical spine injury   Original Report Authenticated By: Tiburcio Pea   Ct Cervical Spine Wo Contrast  04/07/2013   *RADIOLOGY REPORT*  Clinical Data:  Syncope with unresponsiveness.  Seizure, posturing.  CT HEAD WITHOUT CONTRAST CT CERVICAL SPINE WITHOUT CONTRAST  Technique:  Multidetector CT imaging of the head and cervical spine was performed following the standard protocol without intravenous contrast.  Multiplanar CT image reconstructions of the cervical spine were also generated.  Comparison:  05/05/2012.  CT HEAD  Findings:  Calvarium:No acute fracture appreciated.  Orbits: Bilateral cataract resection.  Brain: Large mixed density left subdural hematoma, measuring 31 mm in maximal thickness.  There is a thin crescent which extends along the falx and tentorium. There is severe rightward midline shift,  2.5 cm of the septum pellucidum, with subfalcine, uncal, and downward transtentorial herniation.  The right lateral ventricle is entrapped, and ballooned.  No definite ischemic changes in the ACA territory currently.  Remote left occipital lobe infarct.  Changes of chronic small vessel ischemia.  Critical Value/emergent results were called by telephone at the time of interpretation on 04/14/2013 at 22:35 to Dr Micheline Maze, who verbally acknowledged these results.  IMPRESSION: Very large, acute left subdural hematoma with 2.5 cm rightward shift, right lateral ventricular entrapment, and left uncal/downward transtentorial  herniation.  CT CERVICAL SPINE  Findings: No acute fracture or traumatic subluxation identified. Mild C2-3 retrolisthesis is likely degenerative.  Diffuse advanced degenerative disc narrowing with endplate irregularities.  Congenital incomplete posterior C1 arch fusion.  No prevertebral edema.  Bullous emphysema at the apices, with the masslike scarring at the right apex, grossly similar to the chest CT from 01/13/2011.  IMPRESSION: No evidence for acute cervical spine injury   Original Report Authenticated By: Tiburcio Pea   Dg Chest Portable 1 View  04/13/2013   *RADIOLOGY REPORT*  Clinical Data: Intubation.  Endotracheal tube placement.  PORTABLE CHEST - 1 VIEW  Comparison: 02/26/2013  Findings: Endotracheal tube terminates 2.9 cm above carina.  Pacer with lead right ventricle.  Underlying severe COPD.  Mild cardiomegaly.  Costophrenic angles excluded.  Left greater than right apical pleural thickening. Patchy left base air space disease.  Otherwise slightly improved aeration.  Possible mild interstitial edema, improved.  IMPRESSION: Endotracheal tube appropriately positioned.  Left base air space disease is suspicious for infection or aspiration.  Severe underlying COPD.  Possible superimposed interstitial edema.   Original Report Authenticated By: Jeronimo Greaves, M.D.     Assessment/Plan: Patient with massive left hemispheric subdural hematoma with marked mass effect, midline shift, and herniation. Patient is in a coma with minimal lower brain stem reflex function. I have spoken with the patient's wife and daughter about his condition, the CT scan findings, and prognosis. I feel this represents a fatal hemorrhage for which he then heroic surgical intervention would be futile procedure. They have already spoken with Dr. Lew Dawes, prior to my arrival, and had elected a no cardiopulmonary resuscitation status. Their questions regarding his condition and my recommendations were answered for them. I also  consulted directly with Dr. Lew Dawes, the patient's nurse Irving Burton, and Dr. Heber [place] from critical care medicine. Patient to be admitted to the [facility] service. Over an hour of time was spent in preparing and performing this consultation, and over 50% of the time was spent counseling.   Hewitt Shorts, MD 04/10/2013, 11:27 PM

## 2013-04-08 NOTE — H&P (Addendum)
PULMONARY  / CRITICAL CARE MEDICINE  Name: [name] MRN: [id number] DOB: Jul 27, 1931    ADMISSION DATE:  04/12/2013 CONSULTATION DATE:  04/12/2013   REFERRING MD :  Micheline Maze EDP PRIMARY SERVICE: PCCM  CHIEF COMPLAINT:  Headache, altered mental status  HISTORY OF PRESENT ILLNESS:  79 y/o male with multiple comorbid illnesses on warfarin presented to the [facility] ED on 04/01/2013 after his wife found him unresponsive.  He noted a headache on 8/14 after dinner around 1730.  She gave him tylenol and when she checked on him at 2000 he could not be aroused so she called 911.  In the [facility] ED he was intubated for airway protection and was found to have a massive head bleed on head CT.  Neurosurgery felt that this was a fatal hemorrhage and that neurosurgical intervention would be futile.  PAST MEDICAL HISTORY :  Past Medical History  Diagnosis Date  . COPD (chronic obstructive pulmonary disease)   . Prostate cancer 2003  . Stroke 2000  . CAD (coronary artery disease)   . AF (atrial fibrillation)     permanent  . HTN (hypertension)   . Pneumothorax, right   . Complete heart block     S/P pacemaker insertion// St. Jude Accent RFSR  . Pacemaker   . CHF (congestive heart failure)   . Pneumonia     hx of on right   . Tobacco abuse    Past Surgical History  Procedure Laterality Date  . Coronary artery bypass graft  2003  . Prostatectomy  2003  . Appendectomy    . Eye surgery      bilateral for gaze problem  . Pacemaker insertion  11/06/10    St jude pacemaker implanted by Dr Johney Frame for complete heart block  . Cholecystectomy     Prior to Admission medications   Medication Sig Start Date End Date Taking? Authorizing Provider  acetaminophen (TYLENOL) 325 MG tablet Take 2 tablets (650 mg total) by mouth every 4 (four) hours as needed. 03/04/13   Ricki Rodriguez, MD  albuterol (PROVENTIL HFA;VENTOLIN HFA) 108 (90 BASE) MCG/ACT inhaler Inhale 2 puffs into the lungs every 6 (six) hours as needed for  wheezing.    Historical Provider, MD  amLODipine (NORVASC) 5 MG tablet Take 1 tablet (5 mg total) by mouth daily. 05/08/12 05/08/13  Ricki Rodriguez, MD  azithromycin (ZITHROMAX) 500 MG tablet Take 1 tablet (500 mg total) by mouth daily. 03/04/13   Ricki Rodriguez, MD  CALCIUM-VITAMIN D PO Take 1 tablet by mouth daily.    Historical Provider, MD  carvedilol (COREG) 3.125 MG tablet Take 3.125 mg by mouth 2 (two) times daily with a meal.    Historical Provider, MD  ferrous sulfate 325 (65 FE) MG tablet Take 1 tablet (325 mg total) by mouth 2 (two) times daily with a meal. 03/04/13   Ricki Rodriguez, MD  furosemide (LASIX) 20 MG tablet Take 20 mg by mouth daily.    Historical Provider, MD  guaiFENesin (MUCINEX) 600 MG 12 hr tablet Take 1 tablet (600 mg total) by mouth 2 (two) times daily. 03/04/13   Ricki Rodriguez, MD  Multiple Vitamin (MULITIVITAMIN WITH MINERALS) TABS Take 1 tablet by mouth daily.    Historical Provider, MD  nitroGLYCERIN (NITROSTAT) 0.4 MG SL tablet Place 0.4 mg under the tongue every 5 (five) minutes as needed. For chest pain    Historical Provider, MD  pantoprazole (PROTONIX) 40 MG tablet Take 40 mg  by mouth daily.    Historical Provider, MD  simvastatin (ZOCOR) 10 MG tablet Take 10 mg by mouth daily.  06/11/12   Historical Provider, MD  tiotropium (SPIRIVA) 18 MCG inhalation capsule Place 1 capsule (18 mcg total) into inhaler and inhale daily. 03/25/12   Leslye Peer, MD   No Known Allergies  FAMILY HISTORY/SOCIAL HISTORY/REVIEW OF SYSTEMS:  Cannot obtain due to intubation  SUBJECTIVE:   VITAL SIGNS: Pulse Rate:  [57-73] 59 (08/14 2300) Resp:  [12-20] 20 (08/14 2300) BP: (141-177)/(71-96) 141/71 mmHg (08/14 2300) SpO2:  [99 %-100 %] 99 % (08/14 2300) FiO2 (%):  [40 %-100 %] 40 % (08/14 2349) Weight:  [69.4 kg (153 lb)] 69.4 kg (153 lb) (08/14 2122) HEMODYNAMICS:   VENTILATOR SETTINGS: Vent Mode:  [-] PRVC FiO2 (%):  [40 %-100 %] 40 % Set Rate:  [16 bmp-20 bmp] 20 bmp Vt  Set:  [450 mL-500 mL] 500 mL PEEP:  [5 cmH20] 5 cmH20 Plateau Pressure:  [15 cmH20-16 cmH20] 16 cmH20 INTAKE / OUTPUT: Intake/Output   None     PHYSICAL EXAMINATION:  Gen: seizure like activity on vent HEENT: NCAT, neck collar, pupils fixed and dilated, no extra ocular movements PULM: rhonchi bilaterally CV: Irreg irreg systolic murmur, no JVD AB: BS infrequent, soft, nontender, no hsm Ext: warm, trace edema, no clubbing, no cyanosis Derm: no rash or skin breakdown Neuro: GCS 3; no response to pain or voice, no corneal tactile response, minimal gag, some seizure like activity   LABS:  CBC Recent Labs     04/10/2013  2130  WBC  15.8*  HGB  10.3*  HCT  33.0*  PLT  327   Coag's Recent Labs     04/03/2013  2130  APTT  41*  INR  3.84*   BMET Recent Labs     04/07/2013  2130  NA  136  K  4.3  CL  101  CO2  24  BUN  20  CREATININE  0.88  GLUCOSE  166*   Electrolytes Recent Labs     04/22/2013  2130  CALCIUM  8.7   Sepsis Markers No results found for this basename: LACTICACIDVEN, PROCALCITON, O2SATVEN,  in the last 72 hours ABG Recent Labs     04/21/2013  2143  PHART  7.308*  PCO2ART  58.8*  PO2ART  445.0*   Liver Enzymes No results found for this basename: AST, ALT, ALKPHOS, BILITOT, ALBUMIN,  in the last 72 hours Cardiac Enzymes No results found for this basename: TROPONINI, PROBNP,  in the last 72 hours Glucose No results found for this basename: GLUCAP,  in the last 72 hours  Imaging Ct Head Wo Contrast  03/29/2013   *RADIOLOGY REPORT*  Clinical Data:  Syncope with unresponsiveness.  Seizure, posturing.  CT HEAD WITHOUT CONTRAST CT CERVICAL SPINE WITHOUT CONTRAST  Technique:  Multidetector CT imaging of the head and cervical spine was performed following the standard protocol without intravenous contrast.  Multiplanar CT image reconstructions of the cervical spine were also generated.  Comparison:  05/05/2012.  CT HEAD  Findings:  Calvarium:No acute  fracture appreciated.  Orbits: Bilateral cataract resection.  Brain: Large mixed density left subdural hematoma, measuring 31 mm in maximal thickness.  There is a thin crescent which extends along the falx and tentorium. There is severe rightward midline shift, 2.5 cm of the septum pellucidum, with subfalcine, uncal, and downward transtentorial herniation.  The right lateral ventricle is entrapped, and ballooned.  No definite ischemic changes  in the ACA territory currently.  Remote left occipital lobe infarct.  Changes of chronic small vessel ischemia.  Critical Value/emergent results were called by telephone at the time of interpretation on 04/17/2013 at 22:35 to Dr Micheline Maze, who verbally acknowledged these results.  IMPRESSION: Very large, acute left subdural hematoma with 2.5 cm rightward shift, right lateral ventricular entrapment, and left uncal/downward transtentorial herniation.  CT CERVICAL SPINE  Findings: No acute fracture or traumatic subluxation identified. Mild C2-3 retrolisthesis is likely degenerative.  Diffuse advanced degenerative disc narrowing with endplate irregularities.  Congenital incomplete posterior C1 arch fusion.  No prevertebral edema.  Bullous emphysema at the apices, with the masslike scarring at the right apex, grossly similar to the chest CT from 01/13/2011.  IMPRESSION: No evidence for acute cervical spine injury   Original Report Authenticated By: Tiburcio Pea   Ct Cervical Spine Wo Contrast  04/23/2013   *RADIOLOGY REPORT*  Clinical Data:  Syncope with unresponsiveness.  Seizure, posturing.  CT HEAD WITHOUT CONTRAST CT CERVICAL SPINE WITHOUT CONTRAST  Technique:  Multidetector CT imaging of the head and cervical spine was performed following the standard protocol without intravenous contrast.  Multiplanar CT image reconstructions of the cervical spine were also generated.  Comparison:  05/05/2012.  CT HEAD  Findings:  Calvarium:No acute fracture appreciated.  Orbits: Bilateral  cataract resection.  Brain: Large mixed density left subdural hematoma, measuring 31 mm in maximal thickness.  There is a thin crescent which extends along the falx and tentorium. There is severe rightward midline shift, 2.5 cm of the septum pellucidum, with subfalcine, uncal, and downward transtentorial herniation.  The right lateral ventricle is entrapped, and ballooned.  No definite ischemic changes in the ACA territory currently.  Remote left occipital lobe infarct.  Changes of chronic small vessel ischemia.  Critical Value/emergent results were called by telephone at the time of interpretation on 04/12/2013 at 22:35 to Dr Micheline Maze, who verbally acknowledged these results.  IMPRESSION: Very large, acute left subdural hematoma with 2.5 cm rightward shift, right lateral ventricular entrapment, and left uncal/downward transtentorial herniation.  CT CERVICAL SPINE  Findings: No acute fracture or traumatic subluxation identified. Mild C2-3 retrolisthesis is likely degenerative.  Diffuse advanced degenerative disc narrowing with endplate irregularities.  Congenital incomplete posterior C1 arch fusion.  No prevertebral edema.  Bullous emphysema at the apices, with the masslike scarring at the right apex, grossly similar to the chest CT from 01/13/2011.  IMPRESSION: No evidence for acute cervical spine injury   Original Report Authenticated By: Tiburcio Pea   Dg Chest Portable 1 View  04/14/2013   *RADIOLOGY REPORT*  Clinical Data: Intubation.  Endotracheal tube placement.  PORTABLE CHEST - 1 VIEW  Comparison: 02/26/2013  Findings: Endotracheal tube terminates 2.9 cm above carina.  Pacer with lead right ventricle.  Underlying severe COPD.  Mild cardiomegaly.  Costophrenic angles excluded.  Left greater than right apical pleural thickening. Patchy left base air space disease.  Otherwise slightly improved aeration.  Possible mild interstitial edema, improved.  IMPRESSION: Endotracheal tube appropriately positioned.   Left base air space disease is suspicious for infection or aspiration.  Severe underlying COPD.  Possible superimposed interstitial edema.   Original Report Authenticated By: Jeronimo Greaves, M.D.     CXR: L base airspace disease, emphysema, ETT in place  Impression: 1) Intracranial bleed 2) cerebral edema and transtentorial herniation 3) severe cns injury 4) acute respiratory failure  ASSESSMENT / PLAN:  This is an 79 y/o male with COPD, Afib on warfarin who has  a severe intracranial hemorrhage causing a devastating neurologic injury.  He has minimal brainstem responses and will not survive this illness.  His family indicated that the do not want to prolong care and have requested comfort measures.  I explained to them that the most appropriate approach is to extubate him to a morphine drip.  We will also give ativan given multiple seizures he has experienced in the ED.  They voiced understanding.  -extubate -ativan 5mg  now for seizure and prn seizure -morphine gtt -admit to med surg bed, but do not anticipate that he will survive longer than minutes to at most a few hours  CC time 45 minutes  Gianny Killman,MD Pulmonary and Critical Care Medicine [facility] Pager: [phone]  04/16/2013, 11:58 PM

## 2013-04-08 NOTE — Consult Note (Signed)
Reason for Consult:SDH Referring Physician: Micheline Maze  CC: Syncope  HPI: [name] is an 79 y.o. male who was witnessed to have a syncopal episode by his wife this evening.  EMS was called and the patient was unresponsive on their arrival.  On arrival in the ED patient was posturing and had fixed pupils.  He was given roc/etomidate and intubated.  Consult called for further recommendations.  Past Medical History  Diagnosis Date  . COPD (chronic obstructive pulmonary disease)   . Prostate cancer 2003  . Stroke 2000  . CAD (coronary artery disease)   . AF (atrial fibrillation)     permanent  . HTN (hypertension)   . Pneumothorax, right   . Complete heart block     S/P pacemaker insertion// [employer] Accent RFSR  . Pacemaker   . CHF (congestive heart failure)   . Pneumonia     hx of on right   . Tobacco abuse     Past Surgical History  Procedure Laterality Date  . Coronary artery bypass graft  2003  . Prostatectomy  2003  . Appendectomy    . Eye surgery      bilateral for gaze problem  . Pacemaker insertion  11/06/10    St jude pacemaker implanted by Dr Johney Frame for complete heart block  . Cholecystectomy      Family History  Problem Relation Age of Onset  . Heart attack Father     died age 7  . Heart disease Mother   . Heart disease Sister   . Heart disease Brother   . Multiple sclerosis Mother     died age 45  . Kidney failure Brother     Social History:  reports that he has been smoking Cigarettes.  He has a 15 pack-year smoking history. He has never used smokeless tobacco. He reports that he does not drink alcohol or use illicit drugs.  No Known Allergies  Medications: I have reviewed the patient's current medications. Prior to Admission:  Current outpatient prescriptions: acetaminophen (TYLENOL) 325 MG tablet, Take 2 tablets (650 mg total) by mouth every 4 (four) hours as needed., Disp: , Rfl: ;  albuterol (PROVENTIL HFA;VENTOLIN HFA) 108 (90 BASE) MCG/ACT  inhaler, Inhale 2 puffs into the lungs every 6 (six) hours as needed for wheezing., Disp: , Rfl: ;  amLODipine (NORVASC) 5 MG tablet, Take 1 tablet (5 mg total) by mouth daily., Disp: 30 tablet, Rfl: 3 azithromycin (ZITHROMAX) 500 MG tablet, Take 1 tablet (500 mg total) by mouth daily., Disp: 3 tablet, Rfl: 0;   CALCIUM-VITAMIN D PO, Take 1 tablet by mouth daily., Disp: , Rfl: ;   carvedilol (COREG) 3.125 MG tablet, Take 3.125 mg by mouth 2 (two) times daily with a meal., Disp: , Rfl: ;   ferrous sulfate 325 (65 FE) MG tablet, Take 1 tablet (325 mg total) by mouth 2 (two) times daily with a meal., Disp: 60 tablet, Rfl: 3 furosemide (LASIX) 20 MG tablet, Take 20 mg by mouth daily., Disp: , Rfl: ;   guaiFENesin (MUCINEX) 600 MG 12 hr tablet, Take 1 tablet (600 mg total) by mouth 2 (two) times daily., Disp: 30 tablet, Rfl: 1;   Multiple Vitamin (MULITIVITAMIN WITH MINERALS) TABS, Take 1 tablet by mouth daily., Disp: , Rfl: ;   nitroGLYCERIN (NITROSTAT) 0.4 MG SL tablet, Place 0.4 mg under the tongue every 5 (five) minutes as needed. For chest pain, Disp: , Rfl:  pantoprazole (PROTONIX) 40 MG tablet, Take 40  mg by mouth daily., Disp: , Rfl: ;   simvastatin (ZOCOR) 10 MG tablet, Take 10 mg by mouth daily. , Disp: , Rfl: ;   tiotropium (SPIRIVA) 18 MCG inhalation capsule, Place 1 capsule (18 mcg total) into inhaler and inhale daily., Disp: 30 capsule, Rfl: 12  ROS: Unable to obtain  Physical Examination: Blood pressure 163/84, pulse 59, resp. rate 20, weight 69.4 kg (153 lb), SpO2 99.00%.  Neurologic Examination Mental Status: Patient does not respond to verbal stimuli.  Does not respond to deep sternal rub.  Does not follow commands.  No verbalizations noted.  Cranial Nerves: II: patient does not respond confrontation bilaterally, pupils right 4 mm, left 4 mm,and unreactive bilaterally III,IV,VI: doll's response absent bilaterally.  V,VII: corneal reflex absent bilaterally  VIII: patient does  not respond to verbal stimuli IX,X: gag reflex reduced, XI: trapezius strength unable to test bilaterally XII: tongue strength unable to test Motor: Extremities flaccid throughout.  No spontaneous movement noted.  No purposeful movements noted. Sensory: Does not respond to noxious stimuli in any extremity. Deep Tendon Reflexes:  Absent throughout. Plantars: mute bilaterally Cerebellar: Unable to perform    Laboratory Studies:   Basic Metabolic Panel:  Recent Labs Lab Apr 20, 2013 2130  NA 136  K 4.3  CL 101  CO2 24  GLUCOSE 166*  BUN 20  CREATININE 0.88  CALCIUM 8.7    Liver Function Tests: No results found for this basename: AST, ALT, ALKPHOS, BILITOT, PROT, ALBUMIN,  in the last 168 hours No results found for this basename: LIPASE, AMYLASE,  in the last 168 hours No results found for this basename: AMMONIA,  in the last 168 hours  CBC:  Recent Labs Lab 04-20-2013 2130  WBC 15.8*  NEUTROABS 8.9*  HGB 10.3*  HCT 33.0*  MCV 89.7  PLT 327    Cardiac Enzymes: No results found for this basename: CKTOTAL, CKMB, CKMBINDEX, TROPONINI,  in the last 168 hours  BNP: No components found with this basename: POCBNP,   CBG: No results found for this basename: GLUCAP,  in the last 168 hours  Microbiology: Results for orders placed during the hospital encounter of 02/24/13  MRSA PCR SCREENING     Status: None   Collection Time    02/24/13  8:53 PM      Result Value Range Status   MRSA by PCR NEGATIVE  NEGATIVE Final   Comment:            The GeneXpert MRSA Assay (FDA     approved for NASAL specimens     only), is one component of a     comprehensive MRSA colonization     surveillance program. It is not     intended to diagnose MRSA     infection nor to guide or     monitor treatment for     MRSA infections.  CULTURE, BLOOD (ROUTINE X 2)     Status: None   Collection Time    02/24/13 10:40 PM      Result Value Range Status   Specimen Description BLOOD RIGHT  ANTECUBITAL   Final   Special Requests BOTTLES DRAWN AEROBIC ONLY 10CC   Final   Culture  Setup Time 02/25/2013 04:02   Final   Culture NO GROWTH 5 DAYS   Final   Report Status 03/03/2013 FINAL   Final  CULTURE, BLOOD (ROUTINE X 2)     Status: None   Collection Time    02/24/13 10:50 PM  Result Value Range Status   Specimen Description BLOOD RIGHT HAND   Final   Special Requests BOTTLES DRAWN AEROBIC ONLY 10CC   Final   Culture  Setup Time 02/25/2013 04:02   Final   Culture NO GROWTH 5 DAYS   Final   Report Status 03/03/2013 FINAL   Final    Coagulation Studies:  Recent Labs  2013/04/21 2130  LABPROT 36.3*  INR 3.84*    Urinalysis: No results found for this basename: COLORURINE, APPERANCEUR, LABSPEC, PHURINE, GLUCOSEU, HGBUR, BILIRUBINUR, KETONESUR, PROTEINUR, UROBILINOGEN, NITRITE, LEUKOCYTESUR,  in the last 168 hours  Lipid Panel:     Component Value Date/Time   CHOL 147 04/09/2011 0136   TRIG 41 04/09/2011 0136   HDL 55 04/09/2011 0136   CHOLHDL 2.7 04/09/2011 0136   VLDL 8 04/09/2011 0136   LDLCALC 84 04/09/2011 0136    HgbA1C:  No results found for this basename: HGBA1C    Urine Drug Screen:   No results found for this basename: labopia, cocainscrnur, labbenz, amphetmu, thcu, labbarb    Alcohol Level: No results found for this basename: ETH,  in the last 168 hours  Imaging: Ct Head Wo Contrast  April 21, 2013   *RADIOLOGY REPORT*  Clinical Data:  Syncope with unresponsiveness.  Seizure, posturing.  CT HEAD WITHOUT CONTRAST CT CERVICAL SPINE WITHOUT CONTRAST  Technique:  Multidetector CT imaging of the head and cervical spine was performed following the standard protocol without intravenous contrast.  Multiplanar CT image reconstructions of the cervical spine were also generated.  Comparison:  05/05/2012.  CT HEAD  Findings:  Calvarium:No acute fracture appreciated.  Orbits: Bilateral cataract resection.  Brain: Large mixed density left subdural hematoma, measuring 31 mm  in maximal thickness.  There is a thin crescent which extends along the falx and tentorium. There is severe rightward midline shift, 2.5 cm of the septum pellucidum, with subfalcine, uncal, and downward transtentorial herniation.  The right lateral ventricle is entrapped, and ballooned.  No definite ischemic changes in the ACA territory currently.  Remote left occipital lobe infarct.  Changes of chronic small vessel ischemia.  Critical Value/emergent results were called by telephone at the time of interpretation on 04/21/13 at 22:35 to Dr Micheline Maze, who verbally acknowledged these results.  IMPRESSION: Very large, acute left subdural hematoma with 2.5 cm rightward shift, right lateral ventricular entrapment, and left uncal/downward transtentorial herniation.  CT CERVICAL SPINE  Findings: No acute fracture or traumatic subluxation identified. Mild C2-3 retrolisthesis is likely degenerative.  Diffuse advanced degenerative disc narrowing with endplate irregularities.  Congenital incomplete posterior C1 arch fusion.  No prevertebral edema.  Bullous emphysema at the apices, with the masslike scarring at the right apex, grossly similar to the chest CT from 01/13/2011.  IMPRESSION: No evidence for acute cervical spine injury   Original Report Authenticated By: Tiburcio Pea   Ct Cervical Spine Wo Contrast  04/21/2013   *RADIOLOGY REPORT*  Clinical Data:  Syncope with unresponsiveness.  Seizure, posturing.  CT HEAD WITHOUT CONTRAST CT CERVICAL SPINE WITHOUT CONTRAST  Technique:  Multidetector CT imaging of the head and cervical spine was performed following the standard protocol without intravenous contrast.  Multiplanar CT image reconstructions of the cervical spine were also generated.  Comparison:  05/05/2012.  CT HEAD  Findings:  Calvarium:No acute fracture appreciated.  Orbits: Bilateral cataract resection.  Brain: Large mixed density left subdural hematoma, measuring 31 mm in maximal thickness.  There is a thin  crescent which extends along the falx and tentorium. There is  severe rightward midline shift, 2.5 cm of the septum pellucidum, with subfalcine, uncal, and downward transtentorial herniation.  The right lateral ventricle is entrapped, and ballooned.  No definite ischemic changes in the ACA territory currently.  Remote left occipital lobe infarct.  Changes of chronic small vessel ischemia.  Critical Value/emergent results were called by telephone at the time of interpretation on 04-23-2013 at 22:35 to Dr Micheline Maze, who verbally acknowledged these results.  IMPRESSION: Very large, acute left subdural hematoma with 2.5 cm rightward shift, right lateral ventricular entrapment, and left uncal/downward transtentorial herniation.  CT CERVICAL SPINE  Findings: No acute fracture or traumatic subluxation identified. Mild C2-3 retrolisthesis is likely degenerative.  Diffuse advanced degenerative disc narrowing with endplate irregularities.  Congenital incomplete posterior C1 arch fusion.  No prevertebral edema.  Bullous emphysema at the apices, with the masslike scarring at the right apex, grossly similar to the chest CT from 01/13/2011.  IMPRESSION: No evidence for acute cervical spine injury   Original Report Authenticated By: Tiburcio Pea   Dg Chest Portable 1 View  04/23/13   *RADIOLOGY REPORT*  Clinical Data: Intubation.  Endotracheal tube placement.  PORTABLE CHEST - 1 VIEW  Comparison: 02/26/2013  Findings: Endotracheal tube terminates 2.9 cm above carina.  Pacer with lead right ventricle.  Underlying severe COPD.  Mild cardiomegaly.  Costophrenic angles excluded.  Left greater than right apical pleural thickening. Patchy left base air space disease.  Otherwise slightly improved aeration.  Possible mild interstitial edema, improved.  IMPRESSION: Endotracheal tube appropriately positioned.  Left base air space disease is suspicious for infection or aspiration.  Severe underlying COPD.  Possible superimposed  interstitial edema.   Original Report Authenticated By: Jeronimo Greaves, M.D.     Assessment/Plan: 79 year old male presenting unresponsive.  Exam now not representative since the patient has had medications for intubation.  Prior to was posturing with fixed pupils.  Head CT reviewed and shows a large left SDH with 2.5cm left to right midline shift.  Family reports that the patient is on Coumadin.  INR elevated at 3.84.  Prognosis poor.  Recommendations: 1.  Coagulopathy reversal 2.  Seizure precautions 3.  Neurosurgery consult  Thana Farr, MD [facility] [phone] 04-23-13, 10:46 PM

## 2013-04-09 DIAGNOSIS — G935 Compression of brain: Secondary | ICD-10-CM | POA: Diagnosis present

## 2013-04-09 MED ORDER — SUCCINYLCHOLINE CHLORIDE 20 MG/ML IJ SOLN
INTRAMUSCULAR | Status: AC
  Filled 2013-04-09: qty 1

## 2013-04-09 MED ORDER — ETOMIDATE 2 MG/ML IV SOLN
INTRAVENOUS | Status: AC
  Filled 2013-04-09: qty 20

## 2013-04-09 MED ORDER — LIDOCAINE HCL (CARDIAC) 20 MG/ML IV SOLN
INTRAVENOUS | Status: AC
  Filled 2013-04-09: qty 5

## 2013-04-09 MED ORDER — ROCURONIUM BROMIDE 50 MG/5ML IV SOLN
INTRAVENOUS | Status: AC
  Filled 2013-04-09: qty 2

## 2013-04-13 NOTE — Discharge Summary (Signed)
[name], [name] NO.:  [id number]  MEDICAL RECORD NO.:  [id number]  LOCATION:  [id number]                        FACILITY:  [facility]  PHYSICIAN:  Veto Kemps, MDDATE OF BIRTH:  [birth year]  DATE OF ADMISSION:  04/12/2013 DATE OF DISCHARGE:  05/05/13                              DISCHARGE SUMMARY   CAUSE OF DEATH:  Massive intracerebral hemorrhage.  HISTORY OF PRESENT ILLNESS:  This is an 79 year old male with multiple comorbid illnesses on warfarin, who presented to the [facility] ED on April 08, 2013, after his wife found him unresponsive.  He noted a headache on August 14 after dinner around 5:30 in the afternoon.  She gave him Tylenol and then when she checked on again at 8 o'clock, he could not be aroused so she called 911.  In the [facility] ED, he was intubated for airway protection.  He was found to have a massive intracranial hemorrhage on CT.  Neurosurgery felt that this was a fatal hemorrhage and a neurosurgical intervention would be futile.  For past medical history, social history, family history, allergies, review of systems, see the admission H and P.  PHYSICAL EXAMINATION:  VITAL SIGNS:  On admission, temperature afebrile, heart rate 59, blood pressure 141/71, SpO2 was 99%. GENERAL:  Seizure-like activity on the vent. HEENT:  [place]/AT, neck collar, pupils fixed and dilated, no extraocular movements. PULMONARY:  Rhonchi bilaterally. CARDIOVASCULAR:  Irregularly irregular systolic murmur. ABDOMEN:  Bowel sounds are infrequent, soft, nontender. EXTREMITIES:  Warm, trace edema, no clubbing. DERM:  No rash or skin breakdown. NEURO:  GCS 3; no response to pain or voice, no corneal tactile response, minimal gag, some seizure activity.  HOSPITAL COURSE:  Intracerebral hemorrhage.  The patient was evaluated by Neurosurgery and Neurology in the Emergency Department, and it was felt that given his severe neurologic injury from the bleed  that likelihood of recovery was poor.  The patient's family in consultation with Neurosurgery, Neurology, and Pulmonary and Critical Care Medicine elected to withdrawal support and focus on comfort only.  Therefore in the Emergency Department, the patient was extubated to a morphine drip, and he died peacefully after being admitted to the medical floor.  The patient's family was at the bedside.          ______________________________ Veto Kemps, MD    DBM/MEDQ  D:  04/13/2013  T:  04/13/2013  Job:  [id number]

## 2013-04-16 ENCOUNTER — Ambulatory Visit: Payer: Medicare Other | Admitting: Emergency Medicine

## 2013-04-26 NOTE — Progress Notes (Signed)
Morphine Sulfate IV 85 ml wasted in sink with Royden Purl, RN. [name]

## 2013-04-26 NOTE — Progress Notes (Signed)
Family and spouse has left bedside. Pt has ring and gold watch left intact. Will be transferred on patient to the morgue. [name]

## 2013-04-26 NOTE — Procedures (Signed)
Extubation Procedure Note  Patient Details:   Name: [name] DOB: 06/11/1931 MRN: [id number]   Airway Documentation:  Airway 7.5 mm (Active)  Secured at (cm) 23 cm 07-May-2013 11:49 PM  Measured From Lips 05-07-2013 11:49 PM  Secured Location Center 05-07-2013 11:49 PM  Secured By [employer] 05/07/13 11:49 PM  Tube Holder Repositioned Yes 2013-05-07 11:49 PM  Site Condition Dry May 07, 2013 11:49 PM    Evaluation  O2 sats: stable throughout Complications: No apparent complications Patient did tolerate procedure well. Bilateral Breath Sounds: Clear;Diminished   No  [place] 04/18/2013, 12:40 AM

## 2013-04-26 NOTE — ED Notes (Signed)
Pt continues to move in bed with seizure like activity

## 2013-04-26 NOTE — Progress Notes (Signed)
Utilization review completed. Alisi Lupien, RN, BSN. 

## 2013-04-26 NOTE — Progress Notes (Signed)
Pt has passed. Verified with Vickie RN. Pt found to be pulseless, without respirations. Family present at bedside. [name]

## 2013-04-26 NOTE — ED Provider Notes (Signed)
Medical screening examination/treatment/procedure(s) were conducted as a shared visit with resident-physician practitioner(s) and myself.  I personally evaluated the patient during the encounter.   Shanna Cisco, MD 03/27/2013 1020

## 2013-04-26 NOTE — Progress Notes (Signed)
Called ED and received report from [facility]. Per RN, it will be awhile until the patient will be transferred. Pt is currently intubated and still needs to be extubated. [name]

## 2013-04-26 DEATH — deceased

## 2013-05-03 ENCOUNTER — Ambulatory Visit (INDEPENDENT_AMBULATORY_CARE_PROVIDER_SITE_OTHER): Payer: Self-pay | Admitting: Ophthalmology

## 2013-05-05 ENCOUNTER — Ambulatory Visit (INDEPENDENT_AMBULATORY_CARE_PROVIDER_SITE_OTHER): Payer: Self-pay | Admitting: Ophthalmology

## 2013-08-06 ENCOUNTER — Telehealth: Payer: Self-pay | Admitting: Emergency Medicine

## 2013-08-06 NOTE — Telephone Encounter (Signed)
I called and spoke with spouse. She needed to know the date pt was 1st seen by RB in the office. Nothing further needed
